# Patient Record
Sex: Male | Born: 1960 | Race: Black or African American | Hispanic: No | Marital: Single | Smoking: Never smoker
Health system: Southern US, Community
[De-identification: ages and names within clinical notes are randomized; demographics above are authoritative.]

## PROBLEM LIST (undated history)

## (undated) DIAGNOSIS — Z21 Asymptomatic human immunodeficiency virus [HIV] infection status: Secondary | ICD-10-CM

## (undated) DIAGNOSIS — B20 Human immunodeficiency virus [HIV] disease: Secondary | ICD-10-CM

## (undated) DIAGNOSIS — R7303 Prediabetes: Secondary | ICD-10-CM

## (undated) DIAGNOSIS — M1990 Unspecified osteoarthritis, unspecified site: Secondary | ICD-10-CM

## (undated) DIAGNOSIS — N400 Enlarged prostate without lower urinary tract symptoms: Secondary | ICD-10-CM

## (undated) DIAGNOSIS — I10 Essential (primary) hypertension: Secondary | ICD-10-CM

## (undated) DIAGNOSIS — K219 Gastro-esophageal reflux disease without esophagitis: Secondary | ICD-10-CM

## (undated) HISTORY — DX: Essential (primary) hypertension: I10

## (undated) HISTORY — DX: Asymptomatic human immunodeficiency virus (hiv) infection status: Z21

## (undated) HISTORY — DX: Human immunodeficiency virus (HIV) disease: B20

## (undated) HISTORY — DX: Benign prostatic hyperplasia without lower urinary tract symptoms: N40.0

## (undated) HISTORY — PX: OTHER SURGICAL HISTORY: SHX169

---
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
## 2003-02-05 HISTORY — PX: APPENDECTOMY: SHX54

## 2004-08-18 ENCOUNTER — Emergency Department (HOSPITAL_COMMUNITY): Admission: EM | Admit: 2004-08-18 | Discharge: 2004-08-18 | Payer: Self-pay | Admitting: Emergency Medicine

## 2005-01-21 ENCOUNTER — Emergency Department (HOSPITAL_COMMUNITY): Admission: EM | Admit: 2005-01-21 | Discharge: 2005-01-21 | Payer: Self-pay | Admitting: Emergency Medicine

## 2006-12-27 ENCOUNTER — Emergency Department (HOSPITAL_COMMUNITY): Admission: EM | Admit: 2006-12-27 | Discharge: 2006-12-28 | Payer: Self-pay | Admitting: Emergency Medicine

## 2007-01-07 ENCOUNTER — Emergency Department (HOSPITAL_COMMUNITY): Admission: EM | Admit: 2007-01-07 | Discharge: 2007-01-07 | Payer: Self-pay | Admitting: Family Medicine

## 2007-03-31 ENCOUNTER — Encounter: Payer: Self-pay | Admitting: Internal Medicine

## 2007-03-31 ENCOUNTER — Ambulatory Visit: Payer: Self-pay | Admitting: Internal Medicine

## 2007-03-31 LAB — CONVERTED CEMR LAB
ALT: 17 units/L (ref 0–53)
AST: 24 units/L (ref 0–37)
Basophils Absolute: 0 10*3/uL (ref 0.0–0.1)
Basophils Relative: 1 % (ref 0–1)
CO2: 22 meq/L (ref 19–32)
Calcium: 9.4 mg/dL (ref 8.4–10.5)
Chloride: 104 meq/L (ref 96–112)
Cholesterol: 139 mg/dL (ref 0–200)
Creatinine, Ser: 1.26 mg/dL (ref 0.40–1.50)
HCV Ab: POSITIVE — AB
Hemoglobin: 16.6 g/dL (ref 13.0–17.0)
Hep A Total Ab: POSITIVE — AB
Hep B Core Total Ab: POSITIVE — AB
Hep B E Ab: POSITIVE — AB
Hep B S Ab: POSITIVE — AB
Lymphocytes Relative: 33 % (ref 12–46)
MCHC: 33.5 g/dL (ref 30.0–36.0)
Neutro Abs: 2.1 10*3/uL (ref 1.7–7.7)
Neutrophils Relative %: 53 % (ref 43–77)
PSA: 0.79 ng/mL (ref 0.10–4.00)
Platelets: 211 10*3/uL (ref 150–400)
Potassium: 4.4 meq/L (ref 3.5–5.3)
RDW: 13.5 % (ref 11.5–15.5)
Sodium: 138 meq/L (ref 135–145)
Total CHOL/HDL Ratio: 3.2
Total Protein: 9.5 g/dL — ABNORMAL HIGH (ref 6.0–8.3)

## 2007-04-01 ENCOUNTER — Ambulatory Visit: Payer: Self-pay | Admitting: *Deleted

## 2007-04-07 ENCOUNTER — Encounter (INDEPENDENT_AMBULATORY_CARE_PROVIDER_SITE_OTHER): Payer: Self-pay | Admitting: Internal Medicine

## 2007-04-07 ENCOUNTER — Ambulatory Visit: Payer: Self-pay | Admitting: Internal Medicine

## 2007-04-07 LAB — CONVERTED CEMR LAB
ALT: 17 units/L (ref 0–53)
AST: 20 units/L (ref 0–37)
Absolute CD4: 134 #/uL — ABNORMAL LOW (ref 381–1469)
Albumin: 4.2 g/dL (ref 3.5–5.2)
Basophils Absolute: 0 10*3/uL (ref 0.0–0.1)
Basophils Relative: 1 % (ref 0–1)
CD4 T Helper %: 8 % — ABNORMAL LOW (ref 32–62)
CO2: 25 meq/L (ref 19–32)
Calcium: 9.6 mg/dL (ref 8.4–10.5)
Chlamydia, Swab/Urine, PCR: NEGATIVE
Chloride: 104 meq/L (ref 96–112)
GC Probe Amp, Urine: NEGATIVE
Hemoglobin: 16.1 g/dL (ref 13.0–17.0)
Lymphocytes Relative: 38 % (ref 12–46)
MCHC: 34.6 g/dL (ref 30.0–36.0)
Monocytes Relative: 13 % — ABNORMAL HIGH (ref 3–12)
Neutro Abs: 2 10*3/uL (ref 1.7–7.7)
Neutrophils Relative %: 45 % (ref 43–77)
Potassium: 4.2 meq/L (ref 3.5–5.3)
RBC: 5.29 M/uL (ref 4.22–5.81)
RDW: 12.8 % (ref 11.5–15.5)

## 2007-04-23 ENCOUNTER — Ambulatory Visit: Payer: Self-pay | Admitting: Internal Medicine

## 2007-04-29 ENCOUNTER — Ambulatory Visit: Payer: Self-pay | Admitting: Internal Medicine

## 2007-05-18 ENCOUNTER — Ambulatory Visit: Payer: Self-pay | Admitting: Internal Medicine

## 2007-06-15 ENCOUNTER — Ambulatory Visit: Payer: Self-pay | Admitting: Internal Medicine

## 2007-06-15 LAB — CONVERTED CEMR LAB
ALT: 22 units/L (ref 0–53)
Absolute CD4: 148 #/uL — ABNORMAL LOW (ref 381–1469)
Basophils Absolute: 0 10*3/uL (ref 0.0–0.1)
CO2: 21 meq/L (ref 19–32)
Calcium: 8.7 mg/dL (ref 8.4–10.5)
Chloride: 106 meq/L (ref 96–112)
Creatinine, Ser: 1.31 mg/dL (ref 0.40–1.50)
Glucose, Bld: 89 mg/dL (ref 70–99)
HCT: 45.1 % (ref 39.0–52.0)
HIV 1 RNA Quant: 862 copies/mL — ABNORMAL HIGH (ref <50)
Hemoglobin: 16 g/dL (ref 13.0–17.0)
Lymphocytes Relative: 28 % (ref 12–46)
Lymphs Abs: 1.5 10*3/uL (ref 0.7–4.0)
Monocytes Absolute: 0.5 10*3/uL (ref 0.1–1.0)
Monocytes Relative: 10 % (ref 3–12)
Neutro Abs: 3 10*3/uL (ref 1.7–7.7)
RBC: 5.01 M/uL (ref 4.22–5.81)
RDW: 13.3 % (ref 11.5–15.5)
WBC, lymph enumeration: 5.3 10*3/uL (ref 4.0–10.5)
WBC: 5.3 10*3/uL (ref 4.0–10.5)

## 2007-06-25 ENCOUNTER — Ambulatory Visit: Payer: Self-pay | Admitting: Internal Medicine

## 2007-07-14 ENCOUNTER — Emergency Department (HOSPITAL_COMMUNITY): Admission: EM | Admit: 2007-07-14 | Discharge: 2007-07-15 | Payer: Self-pay | Admitting: Emergency Medicine

## 2007-09-21 ENCOUNTER — Ambulatory Visit: Payer: Self-pay | Admitting: Internal Medicine

## 2007-09-21 LAB — CONVERTED CEMR LAB
CO2: 24 meq/L (ref 19–32)
Creatinine, Ser: 1.4 mg/dL (ref 0.40–1.50)
Eosinophils Relative: 1 % (ref 0–5)
Glucose, Bld: 122 mg/dL — ABNORMAL HIGH (ref 70–99)
HCT: 48.3 % (ref 39.0–52.0)
HIV 1 RNA Quant: <50 copies/mL (ref <50)
Hemoglobin: 16.7 g/dL (ref 13.0–17.0)
Lymphocytes Relative: 20 % (ref 12–46)
Lymphs Abs: 1.3 10*3/uL (ref 0.7–4.0)
Monocytes Absolute: 0.4 10*3/uL (ref 0.1–1.0)
RDW: 13.6 % (ref 11.5–15.5)
Total Bilirubin: 0.4 mg/dL (ref 0.3–1.2)
Total Lymphocytes %: 20 % (ref 12–46)
WBC, lymph enumeration: 6.3 10*3/uL (ref 4.0–10.5)
WBC: 6.3 10*3/uL (ref 4.0–10.5)

## 2007-10-08 ENCOUNTER — Ambulatory Visit: Payer: Self-pay | Admitting: Internal Medicine

## 2007-12-07 ENCOUNTER — Ambulatory Visit: Payer: Self-pay | Admitting: Internal Medicine

## 2007-12-28 DIAGNOSIS — B1710 Acute hepatitis C without hepatic coma: Secondary | ICD-10-CM | POA: Insufficient documentation

## 2007-12-28 DIAGNOSIS — M1990 Unspecified osteoarthritis, unspecified site: Secondary | ICD-10-CM | POA: Insufficient documentation

## 2007-12-28 DIAGNOSIS — G4700 Insomnia, unspecified: Secondary | ICD-10-CM | POA: Insufficient documentation

## 2007-12-28 DIAGNOSIS — B20 Human immunodeficiency virus [HIV] disease: Secondary | ICD-10-CM | POA: Insufficient documentation

## 2008-01-07 ENCOUNTER — Ambulatory Visit: Payer: Self-pay | Admitting: Internal Medicine

## 2008-01-07 LAB — CONVERTED CEMR LAB
Alkaline Phosphatase: 124 units/L — ABNORMAL HIGH (ref 39–117)
Creatinine, Ser: 1.39 mg/dL (ref 0.40–1.50)
Eosinophils Absolute: 0.1 10*3/uL (ref 0.0–0.7)
Eosinophils Relative: 1 % (ref 0–5)
Glucose, Bld: 112 mg/dL — ABNORMAL HIGH (ref 70–99)
HCT: 48.2 % (ref 39.0–52.0)
HIV 1 RNA Quant: 96 copies/mL — ABNORMAL HIGH (ref <48)
HIV-1 RNA Quant, Log: 1.98 — ABNORMAL HIGH (ref <1.68)
Lymphs Abs: 1.3 10*3/uL (ref 0.7–4.0)
MCV: 95.6 fL (ref 78.0–100.0)
Platelets: 341 10*3/uL (ref 150–400)
RDW: 13 % (ref 11.5–15.5)
Sodium: 140 meq/L (ref 135–145)
Total Bilirubin: 0.4 mg/dL (ref 0.3–1.2)
Total Lymphocytes %: 24 % (ref 12–46)
Total Protein: 8.6 g/dL — ABNORMAL HIGH (ref 6.0–8.3)
Total lymphocyte count: 1272 cells/mcL (ref 700–3300)
WBC, lymph enumeration: 5.3 10*3/uL (ref 4.0–10.5)
WBC: 5.3 10*3/uL (ref 4.0–10.5)

## 2008-01-18 ENCOUNTER — Ambulatory Visit: Payer: Self-pay | Admitting: Internal Medicine

## 2008-02-04 ENCOUNTER — Ambulatory Visit: Payer: Self-pay | Admitting: Internal Medicine

## 2008-04-04 ENCOUNTER — Encounter: Payer: Self-pay | Admitting: Internal Medicine

## 2008-04-04 ENCOUNTER — Ambulatory Visit: Payer: Self-pay | Admitting: Internal Medicine

## 2008-04-04 LAB — CONVERTED CEMR LAB
AST: 28 units/L (ref 0–37)
Absolute CD4: 441 #/uL (ref 381–1469)
Alkaline Phosphatase: 125 units/L — ABNORMAL HIGH (ref 39–117)
BUN: 17 mg/dL (ref 6–23)
Band Neutrophils: 0 % (ref 0–10)
Basophils Absolute: 0 10*3/uL (ref 0.0–0.1)
Basophils Relative: 1 % (ref 0–1)
CD4 T Helper %: 26 % — ABNORMAL LOW (ref 32–62)
Creatinine, Ser: 1.24 mg/dL (ref 0.40–1.50)
Eosinophils Absolute: 0.1 10*3/uL (ref 0.0–0.7)
Eosinophils Relative: 3 % (ref 0–5)
Glucose, Bld: 79 mg/dL (ref 70–99)
HIV-1 RNA Quant, Log: <1.68 (ref <1.68)
MCHC: 33.2 g/dL (ref 30.0–36.0)
MCV: 98.2 fL (ref 78.0–100.0)
Monocytes Absolute: 0.6 10*3/uL (ref 0.1–1.0)
Platelets: 302 10*3/uL (ref 150–400)
RDW: 13 % (ref 11.5–15.5)
Total Lymphocytes %: 32 % (ref 12–46)
WBC, lymph enumeration: 5.3 10*3/uL (ref 4.0–10.5)
WBC: 5.3 10*3/uL (ref 4.0–10.5)

## 2008-04-06 ENCOUNTER — Ambulatory Visit: Payer: Self-pay | Admitting: Internal Medicine

## 2008-04-18 ENCOUNTER — Encounter: Payer: Self-pay | Admitting: Internal Medicine

## 2008-04-18 ENCOUNTER — Ambulatory Visit: Payer: Self-pay | Admitting: Internal Medicine

## 2008-04-25 ENCOUNTER — Telehealth: Payer: Self-pay | Admitting: Internal Medicine

## 2008-06-10 ENCOUNTER — Telehealth: Payer: Self-pay | Admitting: Internal Medicine

## 2008-08-04 ENCOUNTER — Ambulatory Visit: Payer: Self-pay | Admitting: Internal Medicine

## 2008-08-04 ENCOUNTER — Encounter: Payer: Self-pay | Admitting: Internal Medicine

## 2008-08-04 DIAGNOSIS — M79609 Pain in unspecified limb: Secondary | ICD-10-CM | POA: Insufficient documentation

## 2008-08-04 LAB — CONVERTED CEMR LAB
AST: 25 units/L (ref 0–37)
Absolute CD4: 296 #/uL — ABNORMAL LOW (ref 381–1469)
Albumin: 4.5 g/dL (ref 3.5–5.2)
Alkaline Phosphatase: 105 units/L (ref 39–117)
BUN: 17 mg/dL (ref 6–23)
Basophils Absolute: 0 10*3/uL (ref 0.0–0.1)
Basophils Relative: 1 % (ref 0–1)
CD4 T Helper %: 26 % — ABNORMAL LOW (ref 32–62)
Creatinine, Ser: 1.34 mg/dL (ref 0.40–1.50)
Eosinophils Relative: 2 % (ref 0–5)
Glucose, Bld: 115 mg/dL — ABNORMAL HIGH (ref 70–99)
HCT: 44.5 % (ref 39.0–52.0)
HIV 1 RNA Quant: <48 copies/mL (ref <48)
HIV-1 RNA Quant, Log: <1.68 (ref <1.68)
Hemoglobin: 15.6 g/dL (ref 13.0–17.0)
Lymphocytes Relative: 20 % (ref 12–46)
MCHC: 35.1 g/dL (ref 30.0–36.0)
Monocytes Absolute: 0.6 10*3/uL (ref 0.1–1.0)
Platelets: 265 10*3/uL (ref 150–400)
Potassium: 4 meq/L (ref 3.5–5.3)
RDW: 13.7 % (ref 11.5–15.5)
Total Bilirubin: 0.5 mg/dL (ref 0.3–1.2)
Total lymphocyte count: 1140 cells/mcL (ref 700–3300)

## 2008-08-18 ENCOUNTER — Encounter: Payer: Self-pay | Admitting: Internal Medicine

## 2008-08-18 ENCOUNTER — Ambulatory Visit: Payer: Self-pay | Admitting: Internal Medicine

## 2008-08-22 ENCOUNTER — Encounter: Payer: Self-pay | Admitting: Internal Medicine

## 2008-08-22 ENCOUNTER — Ambulatory Visit: Payer: Self-pay | Admitting: Internal Medicine

## 2008-08-22 DIAGNOSIS — R030 Elevated blood-pressure reading, without diagnosis of hypertension: Secondary | ICD-10-CM | POA: Insufficient documentation

## 2008-08-22 DIAGNOSIS — K029 Dental caries, unspecified: Secondary | ICD-10-CM | POA: Insufficient documentation

## 2008-09-14 ENCOUNTER — Ambulatory Visit: Payer: Self-pay | Admitting: Internal Medicine

## 2008-10-03 ENCOUNTER — Ambulatory Visit: Payer: Self-pay | Admitting: Internal Medicine

## 2008-10-03 ENCOUNTER — Encounter: Payer: Self-pay | Admitting: Internal Medicine

## 2008-10-26 ENCOUNTER — Encounter: Payer: Self-pay | Admitting: Internal Medicine

## 2008-11-28 ENCOUNTER — Ambulatory Visit: Payer: Self-pay | Admitting: Family Medicine

## 2008-11-28 ENCOUNTER — Encounter: Payer: Self-pay | Admitting: Internal Medicine

## 2008-11-28 DIAGNOSIS — M109 Gout, unspecified: Secondary | ICD-10-CM | POA: Insufficient documentation

## 2008-11-28 LAB — CONVERTED CEMR LAB
ALT: 12 units/L (ref 0–53)
AST: 15 units/L (ref 0–37)
Albumin: 4.4 g/dL (ref 3.5–5.2)
Alkaline Phosphatase: 98 units/L (ref 39–117)
BUN: 13 mg/dL (ref 6–23)
Basophils Absolute: 0 10*3/uL (ref 0.0–0.1)
Chloride: 105 meq/L (ref 96–112)
Eosinophils Relative: 3 % (ref 0–5)
HCT: 48.5 % (ref 39.0–52.0)
HDL: 77 mg/dL (ref >39)
HIV 1 RNA Quant: <48 copies/mL (ref <48)
LDL Cholesterol: 76 mg/dL (ref 0–99)
Lymphocytes Relative: 24 % (ref 12–46)
Lymphs Abs: 1.1 10*3/uL (ref 0.7–4.0)
Neutro Abs: 2.9 10*3/uL (ref 1.7–7.7)
Neutrophils Relative %: 62 % (ref 43–77)
Platelets: 260 10*3/uL (ref 150–400)
Potassium: 4.6 meq/L (ref 3.5–5.3)
RDW: 12.6 % (ref 11.5–15.5)
Total CHOL/HDL Ratio: 2.3
Total lymphocyte count: 1128 cells/mcL (ref 700–3300)
WBC: 4.7 10*3/uL (ref 4.0–10.5)

## 2008-12-15 ENCOUNTER — Ambulatory Visit: Payer: Self-pay | Admitting: Internal Medicine

## 2008-12-15 ENCOUNTER — Encounter: Payer: Self-pay | Admitting: Internal Medicine

## 2008-12-20 ENCOUNTER — Telehealth: Payer: Self-pay | Admitting: Internal Medicine

## 2009-05-04 ENCOUNTER — Ambulatory Visit: Payer: Self-pay | Admitting: Internal Medicine

## 2009-05-04 ENCOUNTER — Encounter: Payer: Self-pay | Admitting: Internal Medicine

## 2009-05-04 LAB — CONVERTED CEMR LAB
Absolute CD4: 306 #/uL — ABNORMAL LOW (ref 381–1469)
BUN: 14 mg/dL (ref 6–23)
Basophils Relative: 1 % (ref 0–1)
CD4 T Helper %: 31 % — ABNORMAL LOW (ref 32–62)
CO2: 22 meq/L (ref 19–32)
Creatinine, Ser: 1.27 mg/dL (ref 0.40–1.50)
Eosinophils Absolute: 0.2 10*3/uL (ref 0.0–0.7)
Eosinophils Relative: 3 % (ref 0–5)
Glucose, Bld: 101 mg/dL — ABNORMAL HIGH (ref 70–99)
HIV 1 RNA Quant: <48 copies/mL (ref <48)
HIV-1 RNA Quant, Log: <1.68 (ref <1.68)
Hemoglobin: 15.3 g/dL (ref 13.0–17.0)
MCHC: 33.2 g/dL (ref 30.0–36.0)
MCV: 97.3 fL (ref 78.0–100.0)
Monocytes Absolute: 0.4 10*3/uL (ref 0.1–1.0)
Monocytes Relative: 9 % (ref 3–12)
RBC: 4.74 M/uL (ref 4.22–5.81)
RDW: 13.5 % (ref 11.5–15.5)
Total Bilirubin: 0.3 mg/dL (ref 0.3–1.2)
Total Protein: 7.9 g/dL (ref 6.0–8.3)
Total lymphocyte count: 987 cells/mcL (ref 700–3300)

## 2009-05-18 ENCOUNTER — Telehealth (INDEPENDENT_AMBULATORY_CARE_PROVIDER_SITE_OTHER): Payer: Self-pay | Admitting: *Deleted

## 2009-06-12 ENCOUNTER — Telehealth: Payer: Self-pay | Admitting: Internal Medicine

## 2009-06-23 ENCOUNTER — Ambulatory Visit: Payer: Self-pay | Admitting: Internal Medicine

## 2009-07-13 ENCOUNTER — Telehealth: Payer: Self-pay | Admitting: Internal Medicine

## 2009-08-10 ENCOUNTER — Ambulatory Visit: Payer: Self-pay | Admitting: Internal Medicine

## 2009-08-10 LAB — CONVERTED CEMR LAB
AST: 34 units/L (ref 0–37)
Albumin: 4.1 g/dL (ref 3.5–5.2)
Alkaline Phosphatase: 111 units/L (ref 39–117)
BUN: 18 mg/dL (ref 6–23)
Basophils Absolute: 0 10*3/uL (ref 0.0–0.1)
Creatinine, Ser: 1.12 mg/dL (ref 0.40–1.50)
Eosinophils Relative: 3 % (ref 0–5)
HCT: 47.2 % (ref 39.0–52.0)
HIV-1 RNA Quant, Log: <1.68 (ref <1.68)
Lymphocytes Relative: 35 % (ref 12–46)
Platelets: 292 10*3/uL (ref 150–400)
Potassium: 4.2 meq/L (ref 3.5–5.3)
RDW: 13.2 % (ref 11.5–15.5)

## 2009-08-15 ENCOUNTER — Telehealth: Payer: Self-pay | Admitting: Internal Medicine

## 2009-09-01 ENCOUNTER — Ambulatory Visit: Payer: Self-pay | Admitting: Internal Medicine

## 2009-09-22 ENCOUNTER — Encounter (INDEPENDENT_AMBULATORY_CARE_PROVIDER_SITE_OTHER): Payer: Self-pay | Admitting: *Deleted

## 2009-12-13 ENCOUNTER — Encounter (INDEPENDENT_AMBULATORY_CARE_PROVIDER_SITE_OTHER): Payer: Self-pay | Admitting: *Deleted

## 2009-12-13 ENCOUNTER — Telehealth (INDEPENDENT_AMBULATORY_CARE_PROVIDER_SITE_OTHER): Payer: Self-pay | Admitting: *Deleted

## 2010-01-02 ENCOUNTER — Encounter (INDEPENDENT_AMBULATORY_CARE_PROVIDER_SITE_OTHER): Payer: Self-pay | Admitting: *Deleted

## 2010-01-22 ENCOUNTER — Encounter: Payer: Self-pay | Admitting: Internal Medicine

## 2010-01-22 ENCOUNTER — Ambulatory Visit: Payer: Self-pay | Admitting: Internal Medicine

## 2010-01-22 LAB — CONVERTED CEMR LAB
ALT: 22 units/L (ref 0–53)
CO2: 26 meq/L (ref 19–32)
Calcium: 9.7 mg/dL (ref 8.4–10.5)
Chloride: 102 meq/L (ref 96–112)
Eosinophils Absolute: 0.2 10*3/uL (ref 0.0–0.7)
HIV 1 RNA Quant: 417000 copies/mL — ABNORMAL HIGH (ref <20)
HIV-1 RNA Quant, Log: 5.62 — ABNORMAL HIGH (ref <1.30)
Lymphocytes Relative: 43 % (ref 12–46)
Lymphs Abs: 2.4 10*3/uL (ref 0.7–4.0)
Neutrophils Relative %: 40 % — ABNORMAL LOW (ref 43–77)
Platelets: 271 10*3/uL (ref 150–400)
Potassium: 4.6 meq/L (ref 3.5–5.3)
Sodium: 139 meq/L (ref 135–145)
Total Protein: 7.8 g/dL (ref 6.0–8.3)
WBC: 5.5 10*3/uL (ref 4.0–10.5)

## 2010-01-31 ENCOUNTER — Telehealth (INDEPENDENT_AMBULATORY_CARE_PROVIDER_SITE_OTHER): Payer: Self-pay | Admitting: *Deleted

## 2010-01-31 ENCOUNTER — Encounter: Payer: Self-pay | Admitting: Internal Medicine

## 2010-02-09 ENCOUNTER — Encounter: Payer: Self-pay | Admitting: Internal Medicine

## 2010-02-09 ENCOUNTER — Ambulatory Visit
Admission: RE | Admit: 2010-02-09 | Discharge: 2010-02-09 | Payer: Self-pay | Source: Home / Self Care | Attending: Internal Medicine | Admitting: Internal Medicine

## 2010-02-12 ENCOUNTER — Encounter: Payer: Self-pay | Admitting: Internal Medicine

## 2010-02-12 ENCOUNTER — Telehealth (INDEPENDENT_AMBULATORY_CARE_PROVIDER_SITE_OTHER): Payer: Self-pay | Admitting: *Deleted

## 2010-03-06 NOTE — Progress Notes (Signed)
Summary: [facility]/pt assist med arrived for Apr  Prescriptions: ATRIPLA 600-200-300 MG TABS (EFAVIRENZ-EMTRICITAB-TENOFOVIR) Take 1 tablet by mouth at bedtime  #30 x 0   Entered by:   Paulo Fruit  BS,CPht II,MPH   Authorized by:   Yisroel Ramming MD   Signed by:   Paulo Fruit  BS,CPht II,MPH on 05/18/2009   Method used:   Samples Given   RxID:   [id number]   Patient Assist Medication Verification: Medication: Atripla Lot# [id number] Exp Date:07 2013 Tech approval:MLD Call placed to patient with message that assistance medications are ready for pick-up. Left message on VM for patient to contact [facility] @ [phone] Paulo Fruit  BS,CPht II,MPH  May 18, 2009 12:12 PM

## 2010-03-06 NOTE — Progress Notes (Signed)
Summary: [facility]/pt assist med arrived for May  Phone Note Refill Request      Prescriptions: ATRIPLA 600-200-300 MG TABS (EFAVIRENZ-EMTRICITAB-TENOFOVIR) Take 1 tablet by mouth at bedtime  #30 x 0   Entered by:   Paulo Fruit  BS,CPht II,MPH   Authorized by:   Yisroel Ramming MD   Signed by:   Paulo Fruit  BS,CPht II,MPH on 06/12/2009   Method used:   Samples Given   RxID:   [id number]   Patient Assist Medication Verification: Medication: Atripla  NFA#[id number] Exp Date:10 2013 Tech approval:MLD               Tried to contact patient. Was unable to leave a message because the mail box was full at the time the call was placed. Paulo Fruit  BS,CPht II,MPH  Jun 12, 2009 3:48 PM

## 2010-03-06 NOTE — Miscellaneous (Signed)
Summary: HIPAA Restrictions  HIPAA Restrictions   Imported By: Florinda Marker 06/27/2009 12:00:21  _____________________________________________________________________  External Attachment:    Type:   Image     Comment:   External Document

## 2010-03-06 NOTE — Miscellaneous (Signed)
Summary: RW Flowsheet update  Clinical Lists Changes  Observations: Added new observation of MARITAL STAT: Single (09/22/2009 11:08) Added new observation of PAT[place]COUNTY: [place] (09/22/2009 11:08) Added new observation of RW VITAL STA: Active (09/22/2009 11:08) Added new observation of INFECTDIS MD: Philipp Deputy (09/22/2009 11:08) Added new observation of GENDER: Male (09/22/2009 11:08) Added new observation of LATINO/HISP: No (09/22/2009 11:08) Added new observation of RACE: African American (09/22/2009 11:08) Added new observation of PAYOR: No Insurance (09/22/2009 11:08)

## 2010-03-06 NOTE — Miscellaneous (Signed)
  Clinical Lists Changes  Observations: Added new observation of YEARAIDSPOS: 2009  (01/02/2010 9:51) Added new observation of HIV STATUS: CDC-defined AIDS  (01/02/2010 9:51)

## 2010-03-06 NOTE — Progress Notes (Signed)
Summary: [facility]/pt assist med arrived for Jun via [facility] ADAP  Phone Note Refill Request      Prescriptions: ATRIPLA 600-200-300 MG TABS (EFAVIRENZ-EMTRICITAB-TENOFOVIR) Take 1 tablet by mouth at bedtime  #30 x 0   Entered by:   Paulo Fruit  BS,CPht II,MPH   Authorized by:   Yisroel Ramming MD   Signed by:   Paulo Fruit  BS,CPht II,MPH on 07/13/2009   Method used:   Samples Given   RxID:   [id number]  Patient Assist Medication Verification: Medication name: Atripla RX # [id number] Tech approval:MLD  Tried to contact patient; number on file is temporarily disconnected. Paulo Fruit  BS,CPht II,MPH  July 13, 2009 10:20 AM

## 2010-03-06 NOTE — Assessment & Plan Note (Signed)
Summary: f/u [mkj]   CC:  follow-up visit, lab results, and needs refill on Vicodin.  History of Present Illness: Pt here for f/u.  He is living with his sister now because he has been unable to find work. No missed doses of his Atripla. Pt c/o intermittant back and knee pain and would like a refill on his vicodin which he takes as needed.  Preventive Screening-Counseling & Management  Alcohol-Tobacco     Alcohol drinks/day: 0     Smoking Status: never  Caffeine-Diet-Exercise     Caffeine use/day: 1     Does Patient Exercise: yes     Type of exercise: walking,weights     Exercise (avg: min/session): 30-60     Times/week: 3  Safety-Violence-Falls     Seat Belt Use: yes      Sexual History:  n/a.    Comments: pt. given condoms   Updated Prior Medication List: ATRIPLA 600-200-300 MG TABS (EFAVIRENZ-EMTRICITAB-TENOFOVIR) Take 1 tablet by mouth at bedtime COLCHICINE 0.6 MG TABS (COLCHICINE) Take 1 tablet by mouth two times a day VICODIN 5-500 MG TABS (HYDROCODONE-ACETAMINOPHEN) Take 1 tablet by mouth every 6 hours as needed TRIAMCINOLONE ACETONIDE 0.1 % CREA (TRIAMCINOLONE ACETONIDE) apply two times a day  Current Allergies (reviewed today): No known allergies  Past History:  Past Medical History: Last updated: 12/28/2007 Hepatitis C HIV disease Osteoarthritis  Review of Systems       The patient complains of weight loss.  The patient denies anorexia, fever, and headaches.    Vital Signs:  Patient profile:   50 year old male Height:      77 inches (195.58 cm) Weight:      205.8 pounds (93.55 kg) BMI:     24.49 Temp:     97.7 degrees F (36.50 degrees C) oral Pulse rate:   71 / minute BP sitting:   132 / 73  (right arm)  Vitals Entered By: Wendall Mola CMA Duncan Dull) (September 01, 2009 9:30 AM) CC: follow-up visit, lab results, needs refill on Vicodin Is Patient Diabetic? No Pain Assessment Patient in pain? yes     Location: knees, lower back Intensity:  8 Type: aching Onset of pain  Constant Nutritional Status BMI of 19 -24 = normal Nutritional Status Detail appetite "better"  Does patient need assistance? Functional Status Self care Ambulation Normal Comments pt. missed three doses of Atripla because he ran out, it should be here   Physical Exam  General:  alert, well-developed, well-nourished, and well-hydrated.   Head:  normocephalic and atraumatic.   Mouth:  pharynx pink and moist.   Lungs:  normal breath sounds.          Medication Adherence: 09/01/2009   Adherence to medications reviewed with patient. Counseling to provide adequate adherence provided   Prevention For Positives: 09/01/2009   Safe sex practices discussed with patient. Condoms offered.                             Impression & Recommendations:  Problem # 1:  HIV DISEASE (ICD-042) Pt.s most recent CD4ct was 530 and VL <48 .  Pt instructed to continue the current antiretroviral regimen.  Pt encouraged to take medication regularly and not miss doses.  Pt will f/u in 3 months for repeat blood work and will see me 2 weeks later.  Diagnostics Reviewed:  CD4: 530 (08/11/2009)   WBC: 5.0 (08/10/2009)   PMN (bands): 0 (04/04/2008)  Hgb: 15.6 (08/10/2009)   HCT: 47.2 (08/10/2009)   Platelets: 292 (08/10/2009) HIV genotype: TNP (09/21/2007)   HIV-1 RNA: <48 copies/mL (08/10/2009)   HBSAg: NEG (08/10/2009)  Problem # 2:  OSTEOARTHRITIS (ICD-715.90) refill vicodin His updated medication list for this problem includes:    Vicodin 5-500 Mg Tabs (Hydrocodone-acetaminophen) .[name]... Take 1 tablet by mouth every 6 hours as needed  Other Orders: Est. Patient Level III ([address]) Future Orders: T-CD4SP ([facility]) (CD4SP) ... 11/30/2009 T-HIV Viral Load [phone]) ... 11/30/2009 T-Comprehensive Metabolic Panel [phone]) ... 11/30/2009 T-CBC w/Diff ([id number]) ... 11/30/2009  Patient Instructions: 1)  Please schedule a follow-up appointment in 3 months, 2  weeks after labs.  Prescriptions: VICODIN 5-500 MG TABS (HYDROCODONE-ACETAMINOPHEN) Take 1 tablet by mouth every 6 hours as needed  #30 x 0   Entered and Authorized by:   Yisroel Ramming MD   Signed by:   Yisroel Ramming MD on 09/01/2009   Method used:   Print then Give to Patient   RxID:   [id number]

## 2010-03-06 NOTE — Assessment & Plan Note (Signed)
Summary: f/u [mkj]   CC:  follow-up visit and lab results.  History of Present Illness: Pt here for f/u.  Needs refill on Atripla and triamcinolone cream. No missed doses of his meds.  Preventive Screening-Counseling & Management  Alcohol-Tobacco     Alcohol drinks/day: 0     Smoking Status: never  Caffeine-Diet-Exercise     Caffeine use/day: 1     Does Patient Exercise: yes     Type of exercise: walking,weights     Exercise (avg: min/session): 30-60     Times/week: 3  Safety-Violence-Falls     Seat Belt Use: yes      Sexual History:  n/a.    Comments: pt. given condoms   Updated Prior Medication List: ATRIPLA 600-200-300 MG TABS (EFAVIRENZ-EMTRICITAB-TENOFOVIR) Take 1 tablet by mouth at bedtime COLCHICINE 0.6 MG TABS (COLCHICINE) Take 1 tablet by mouth two times a day VICODIN 5-500 MG TABS (HYDROCODONE-ACETAMINOPHEN) Take 1 tablet by mouth every 6 hours as needed TRIAMCINOLONE ACETONIDE 0.1 % CREA (TRIAMCINOLONE ACETONIDE) apply two times a day  Current Allergies (reviewed today): No known allergies  Past History:  Past Medical History: Last updated: 12/28/2007 Hepatitis C HIV disease Osteoarthritis  Social History: Sexual History:  n/a  Review of Systems  The patient denies anorexia, fever, and weight loss.    Vital Signs:  Patient profile:   50 year old male Height:      77 inches (195.58 cm) Weight:      215.8 pounds (98.09 kg) BMI:     25.68 Temp:     98.0 degrees F (36.67 degrees C) oral Pulse rate:   87 / minute BP sitting:   133 / 74  (right arm)  Vitals Entered By: Wendall Mola CMA Duncan Dull) (Jun 23, 2009 2:56 PM) CC: follow-up visit and lab results Is Patient Diabetic? No Pain Assessment Patient in pain? no      Nutritional Status BMI of 25 - 29 = overweight Nutritional Status Detail appetite "so-so"  Does patient need assistance? Functional Status Self care Ambulation Normal Comments pt. missed about 3 weeks of meds since  last visit   Physical Exam  General:  alert, well-developed, well-nourished, and well-hydrated.   Head:  normocephalic and atraumatic.   Mouth:  pharynx pink and moist.   Lungs:  normal breath sounds.      Impression & Recommendations:  Problem # 1:  HIV DISEASE (ICD-042) Pt.s most recent CD4ct was 306 and VL <48 .  Pt instructed to continue the current antiretroviral regimen.  Pt encouraged to take medication regularly and not miss doses.  Pt will f/u in 6 weeks for repeat blood work and will see me 2 weeks later.  Diagnostics Reviewed:  WBC: 4.7 (05/04/2009)   PMN (bands): 0 (04/04/2008)   Hgb: 15.3 (05/04/2009)   HCT: 46.1 (05/04/2009)   Platelets: 288 (05/04/2009) HIV genotype: TNP (09/21/2007)   HIV-1 RNA: <48 copies/mL (05/04/2009)     Medications Added to Medication List This Visit: 1)  Triamcinolone Acetonide 0.1 % Crea (Triamcinolone acetonide) .... Apply two times a day  Other Orders: Est. Patient Level III ([address]) Future Orders: T-CD4SP ([facility]) (CD4SP) ... 08/04/2009 T-HIV Viral Load [phone]) ... 08/04/2009 T-Comprehensive Metabolic Panel [phone]) ... 08/04/2009 T-CBC w/Diff ([id number]) ... 08/04/2009 T-Hepatitis B Surface Antigen [phone]) ... 08/04/2009  Patient Instructions: 1)  Please schedule a follow-up appointment in 8 weeks, 2 weeks after labs.  Prescriptions: TRIAMCINOLONE ACETONIDE 0.1 % CREA (TRIAMCINOLONE ACETONIDE) apply two times a day  #30gm  x 5   Entered and Authorized by:   Yisroel Ramming MD   Signed by:   Yisroel Ramming MD on 06/23/2009   Method used:   Print then Give to Patient   RxID:   [id number]

## 2010-03-06 NOTE — Progress Notes (Signed)
Summary: Atripla refill  Phone Note Call from Patient   Caller: Patient Reason for Call: Refill Medication Summary of Call: Pt. called requesting refill on Atripla, pt. set up for labs and office visit,  RX sent to [facility] and will be shipped to this clinic Initial call taken by: Wendall Mola CMA Duncan Dull),  December 13, 2009 3:09 PM     Appended Document: Atripla refill Pt. notified ADAP med. arrived, will pick up Monday 12/18/09

## 2010-03-06 NOTE — Miscellaneous (Signed)
  Clinical Lists Changes  Orders: Added new Test order of T-CBC w/Diff [phone]) - Signed Added new Test order of T-CD4SP [facility]) (CD4SP) - Signed Added new Test order of T-Comprehensive Metabolic Panel [phone]) - Signed Added new Test order of T-HIV Viral Load [phone]) - Signed     Process Orders Check Orders Results:     [facility]: ABN not required for this insurance Tests Sent for requisitioning (December 13, 2009 2:49 PM):     12/20/2009: [facility] -- T-CBC w/Diff [[id number]] (signed)     12/20/2009: [facility] -- T-Comprehensive Metabolic Panel [[id number]] (signed)     12/20/2009: [facility] -- T-HIV Viral Load [phone] (signed)

## 2010-03-06 NOTE — Progress Notes (Signed)
Summary: [facility]AP/pt assist med arrived for Jul  Phone Note Refill Request      Prescriptions: ATRIPLA 600-200-300 MG TABS (EFAVIRENZ-EMTRICITAB-TENOFOVIR) Take 1 tablet by mouth at bedtime  #30 x 0   Entered by:   Paulo Fruit  BS,CPht II,MPH   Authorized by:   Yisroel Ramming MD   Signed by:   Paulo Fruit  BS,CPht II,MPH on 08/15/2009   Method used:   Samples Given   RxID:   [id number]  Patient Assist Medication Verification: Medication name: Atripla RX # [id number] Tech approval:MLD  Tried to contact patient.  The number on file is disconnected. Paulo Fruit  BS,CPht II,MPH  August 15, 2009 10:54 AM

## 2010-03-06 NOTE — Miscellaneous (Signed)
Summary: Office Visit ([facility])    Vital Signs:  Patient profile:   50 year old male Weight:      224.8 pounds Temp:     97.9 degrees F oral Pulse rate:   82 / minute Pulse rhythm:   regular Resp:     18 per minute BP sitting:   147 / 87  (left arm)  Vitals Entered By: Sharen Heck RN (May 04, 2009 9:28 AM) CC: f/u 05 Is Patient Diabetic? No Pain Assessment Patient in pain? no       Does patient need assistance? Functional Status Self care Ambulation Normal   CC:  f/u 05.  History of Present Illness: Pt feeling well. No missed doses of his Atripla.  Preventive Screening-Counseling & Management  Alcohol-Tobacco     Alcohol drinks/day: 0     Smoking Status: never  Caffeine-Diet-Exercise     Caffeine use/day: 1     Does Patient Exercise: yes     Type of exercise: walking,weights     Exercise (avg: min/session): 30-60     Times/week: 3  Current Problems (verified): 1)  Gout, Unspecified  (ICD-274.9) 2)  Dental Caries  (ICD-521.00) 3)  Elevated Blood Pressure  (ICD-796.2) 4)  Toe Pain  (ICD-729.5) 5)  Preventive Health Care  (ICD-V70.0) 6)  Insomnia  (ICD-780.52) 7)  Osteoarthritis  (ICD-715.90) 8)  HIV Disease  (ICD-042) 9)  Hepatitis C  (ICD-070.51)  Current Medications (verified): 1)  Atripla 600-200-300 Mg Tabs (Efavirenz-Emtricitab-Tenofovir) .... Take 1 Tablet By Mouth At Bedtime 2)  Colchicine 0.6 Mg Tabs (Colchicine) .... Take 1 Tablet By Mouth Two Times A Day 3)  Vicodin 5-500 Mg Tabs (Hydrocodone-Acetaminophen) .... Take 1 Tablet By Mouth Every 6 Hours As Needed  Allergies: No Known Drug Allergies   Review of Systems  The patient denies anorexia, fever, and weight loss.     Physical Exam  General:  alert, well-developed, well-nourished, and well-hydrated.   Head:  normocephalic and atraumatic.   Mouth:  pharynx pink and moist.   Lungs:  normal breath sounds.      Impression & Recommendations:  Problem # 1:  HIV DISEASE  (ICD-042) Will obtain labs today and have pt f/u in 2 weeks. Diagnostics Reviewed:  WBC: 4.7 (11/28/2008)   PMN (bands): 0 (04/04/2008)   Hgb: 16.4 (11/28/2008)   HCT: 48.5 (11/28/2008)   Platelets: 260 (11/28/2008) HIV genotype: TNP (09/21/2007)   HIV-1 RNA: <48 copies/mL (11/28/2008)     Other Orders: T-CD4SP ([facility]) (CD4SP) T-HIV Viral Load ([id number]) T-Comprehensive Metabolic Panel ([id number]) T-CBC w/Diff ([id number]) Est. Patient Level III (57[address])    Patient Instructions: 1)  Please schedule a follow-up appointment in 2 weeks.

## 2010-03-07 ENCOUNTER — Encounter (INDEPENDENT_AMBULATORY_CARE_PROVIDER_SITE_OTHER): Payer: Self-pay | Admitting: *Deleted

## 2010-03-08 NOTE — Progress Notes (Signed)
Summary: ADAP rxes arrived, phone disconnected  Phone Note Outgoing Call   Call placed by: Jennet Maduro RN,  January 31, 2010 4:16 PM Call placed to: Patient Action Taken: Assistance medications ready for pick up Summary of Call: [facility] rxes arrived. Current phone number disconnected. Jennet Maduro RN  January 31, 2010 4:17 PM

## 2010-03-08 NOTE — Assessment & Plan Note (Signed)
Summary: F/U OV/VS   CC:  follow-up visit, lab results, pt. was in the hosp. in [place] for pneumonia 11/11, and c/o rash on forearms.  History of Present Illness: patient has been out of town taking care of his uncle.  He has been off his medications for approximately 2 to 3 months.  He was also diagnosed with a pneumonia while he was out of town.  She has also lost weight.  When he returned to [place] he restarted his Atripla. He has been on his Atripla now for about two weeks.  He c/o bilateral knee pain and gout pain. Pt would like a Prostate screening test.  Preventive Screening-Counseling & Management  Alcohol-Tobacco     Alcohol drinks/day: 0     Smoking Status: never  Caffeine-Diet-Exercise     Caffeine use/day: 1     Does Patient Exercise: yes     Type of exercise: walking,weights     Exercise (avg: min/session): 30-60     Times/week: 3  Safety-Violence-Falls     Seat Belt Use: yes      Sexual History:  n/a.    Comments: pt. given condoms   Updated Prior Medication List: ATRIPLA 600-200-300 MG TABS (EFAVIRENZ-EMTRICITAB-TENOFOVIR) Take 1 tablet by mouth at bedtime VICODIN 5-500 MG TABS (HYDROCODONE-ACETAMINOPHEN) Take 1 tablet by mouth every 6 hours as needed TRIAMCINOLONE ACETONIDE 0.1 % CREA (TRIAMCINOLONE ACETONIDE) apply two times a day COLCRYS 0.6 MG TABS (COLCHICINE) take as directed  Current Allergies (reviewed today): No known allergies  Past History:  Past Medical History: Last updated: 12/28/2007 Hepatitis C HIV disease Osteoarthritis  Review of Systems  The patient denies anorexia, fever, and weight loss.    Vital Signs:  Patient profile:   50 year old male Height:      77 inches (195.58 cm) Weight:      208.8 pounds (94.91 kg) BMI:     24.85 Temp:     97.9 degrees F (36.61 degrees C) oral Pulse rate:   92 / minute BP sitting:   123 / 86  (right arm)  Vitals Entered By: Wendall Mola CMA Duncan Dull) (February 09, 2010 3:21  PM) CC: follow-up visit, lab results, pt. was in the hosp. in [place] for pneumonia 11/11, c/o rash on forearms Is Patient Diabetic? No Pain Assessment Patient in pain? yes     Location: knees and feet Intensity: 10 Type: burning and throbbing Onset of pain  Constant Nutritional Status BMI of 19 -24 = normal Nutritional Status Detail appetite "good"  Have you ever been in a relationship where you felt threatened, hurt or afraid?No   Does patient need assistance? Functional Status Self care Ambulation Normal Comments pt. was off Atripla for one month requests refills Vicodin and Colchicine   Physical Exam  General:  alert, well-developed, well-nourished, and well-hydrated.   Head:  normocephalic, atraumatic, no abnormalities observed, and no abnormalities palpated.   Eyes:  vision grossly intact and pupils equal.   Mouth:  pharynx pink and moist.  no thrush  Lungs:  normal breath sounds.     Impression & Recommendations:  Problem # 1:  HIV DISEASE (ICD-042) VL up and CD4ct down off his Atripla. Now that he restarted will check labs in 6 weeks.  He is encouraged to take his Atripla every day. Diagnostics Reviewed:  HIV: CDC-defined AIDS (01/02/2010)   CD4: 280 (01/23/2010)   WBC: 5.5 (01/22/2010)   PMN (bands): 0 (04/04/2008)   Hgb: 15.8 (01/22/2010)   HCT: 43.8 (  01/22/2010)   Platelets: 271 (01/22/2010) HIV genotype: TNP (09/21/2007)   HIV-1 RNA: 417000 (01/22/2010)   HBSAg: NEG (08/10/2009)  Problem # 2:  GOUT, UNSPECIFIED (ICD-274.9) will try to enroll him in a [facility] program for colcrys since they are not making colchicine anymore. The following medications were removed from the medication list:    Colchicine 0.6 Mg Tabs (Colchicine) .[name]... Take 1 tablet by mouth two times a day His updated medication list for this problem includes:    Colcrys 0.6 Mg Tabs (Colchicine) .[name]... Take as directed  Problem # 3:  PREVENTIVE HEALTH CARE (ICD-V70.0)  Orders: T-PSA  ([id number])  Medications Added to Medication List This Visit: 1)  Colcrys 0.6 Mg Tabs (Colchicine) .... Take as directed  Other Orders: Est. Patient Level III ([address]) TB Skin Test [phone]) Admin 1st Vaccine (24401) Future Orders: T-CD4SP ([facility]) (CD4SP) ... 03/23/2010 T-HIV Viral Load [phone]) ... 03/23/2010 T-Comprehensive Metabolic Panel [id number]) ... 03/23/2010 T-CBC w/Diff ([id number]) ... 03/23/2010 T-RPR (Syphilis) [phone]) ... 03/23/2010 T-Lipid Profile [phone]) ... 03/23/2010  Patient Instructions: 1)  Please schedule a follow-up 30 minute appointment in 8 weeks, 2 weeks after labs.  Prescriptions: COLCRYS 0.6 MG TABS (COLCHICINE) take as directed  #60 x 5   Entered and Authorized by:   Yisroel Ramming MD   Signed by:   Yisroel Ramming MD on 02/09/2010   Method used:   Print then Give to Patient   RxID:   [id number] VICODIN 5-500 MG TABS (HYDROCODONE-ACETAMINOPHEN) Take 1 tablet by mouth every 6 hours as needed  #30 x 0   Entered and Authorized by:   Yisroel Ramming MD   Signed by:   Yisroel Ramming MD on 02/09/2010   Method used:   Print then Give to Patient   RxID:   [id number]      Immunization History:  Influenza Immunization History:    Influenza:  historical (12/20/2009)  Immunizations Administered:  PPD Skin Test:    Vaccine Type: PPD    Site: right forearm    Mfr: Sanofi Pasteur    Dose: 0.1 ml    Route: ID    Given by: Wendall Mola CMA ( AAMA)    Exp. Date: 08/11/2011    Lot #: [id number]

## 2010-03-08 NOTE — Miscellaneous (Signed)
  Clinical Lists Changes  Observations: Added new observation of TB PPDRESULT: negative (02/12/2010 12:28) Added new observation of PPD RESULT: < 5mm (02/12/2010 12:28) Added new observation of TB-PPD RDDTE: 02/12/2010 (02/12/2010 12:28)      PPD Results    Date of reading: 02/12/2010    Results: < 5mm    Interpretation: negative

## 2010-03-08 NOTE — Letter (Signed)
Summary: [facility]  [address]   [place], [place] [address]   Phone: [phone]  Fax: [phone]         December 28,2011  [facility] [address] [place], [place]  [address]  Dear Mr. Tonkovich,  Your ADAP medications have arrived at the Center.  The telephone number that we have listed for you is out-of-order.  When you come to pick up your medication please update your phone number with out front desk personnel.  Thank you.  Sincerely,    Jennet Maduro [facility]

## 2010-03-08 NOTE — Progress Notes (Signed)
Summary: Dermatology referral  Phone Note Call from Patient   Caller: Patient Summary of Call: Pt. called and is concerned about worsening itchy rash on his elbows.  He said he showed it to you at office visit, but then got talking about something else.  The triamcinolone cream is not helping.  Is there something else you can prescribe? Initial call taken by: Wendall Mola CMA Duncan Dull),  February 12, 2010 12:34 PM  Follow-up for Phone Call        refer to Dermtology can take benadryl or claritin OTC Follow-up by: Yisroel Ramming MD,  February 12, 2010 2:11 PM  Additional Follow-up for Phone Call Additional follow up Details #1::        Pt. has no insurance and there are no dermatologist that participate with the discount card.  [facility] charges $125.00 for initial visit, and pt. can arrange pymt. plan after that.  Pt. aware and will call next week, if he wants referral scheduled. Additional Follow-up by: Wendall Mola CMA Duncan Dull),  February 13, 2010 12:33 PM

## 2010-03-14 ENCOUNTER — Encounter (INDEPENDENT_AMBULATORY_CARE_PROVIDER_SITE_OTHER): Payer: Self-pay | Admitting: *Deleted

## 2010-03-14 NOTE — Miscellaneous (Signed)
  Clinical Lists Changes  Observations: Added new observation of INCOMESOURCE: UNKNOWN (03/07/2010 12:06) Added new observation of HOUSEINCOME: 0  (03/07/2010 12:06) Added new observation of #CHILD<18 IN: Unknown  (03/07/2010 12:06) Added new observation of FAMILYSIZE: 2  (03/07/2010 12:06) Added new observation of HOUSING: Stable/permanent  (03/07/2010 12:06) Added new observation of YEARLYEXPEN: 0  (03/07/2010 12:06) Added new observation of HIV RISK BEH: Unknown  (03/07/2010 12:06)

## 2010-03-20 ENCOUNTER — Telehealth: Payer: Self-pay | Admitting: Adult Health

## 2010-03-22 NOTE — Miscellaneous (Signed)
  Clinical Lists Changes 

## 2010-03-26 ENCOUNTER — Other Ambulatory Visit (INDEPENDENT_AMBULATORY_CARE_PROVIDER_SITE_OTHER): Payer: Self-pay

## 2010-03-26 ENCOUNTER — Encounter: Payer: Self-pay | Admitting: Adult Health

## 2010-03-26 ENCOUNTER — Other Ambulatory Visit: Payer: Self-pay | Admitting: Adult Health

## 2010-03-26 DIAGNOSIS — B20 Human immunodeficiency virus [HIV] disease: Secondary | ICD-10-CM

## 2010-03-26 LAB — CONVERTED CEMR LAB
ALT: 26 units/L (ref 0–53)
BUN: 12 mg/dL (ref 6–23)
Basophils Absolute: 0 10*3/uL (ref 0.0–0.1)
Basophils Relative: 1 % (ref 0–1)
CO2: 27 meq/L (ref 19–32)
Creatinine, Ser: 1.27 mg/dL (ref 0.40–1.50)
HCT: 43.8 % (ref 39.0–52.0)
HDL: 73 mg/dL (ref >39)
HIV 1 RNA Quant: 649 {copies}/mL — ABNORMAL HIGH
HIV-1 RNA Quant, Log: 2.81 — ABNORMAL HIGH
Hemoglobin: 15.3 g/dL (ref 13.0–17.0)
Lymphocytes Relative: 34 % (ref 12–46)
MCV: 92.6 fL (ref 78.0–100.0)
Neutro Abs: 2.6 10*3/uL (ref 1.7–7.7)
Platelets: 285 10*3/uL (ref 150–400)
Total Bilirubin: 0.2 mg/dL — ABNORMAL LOW (ref 0.3–1.2)
Triglycerides: 57 mg/dL (ref <150)
WBC: 4.9 10*3/uL (ref 4.0–10.5)

## 2010-03-27 LAB — T-HELPER CELL (CD4) - (RCID CLINIC ONLY)
CD4 % Helper T Cell: 21 % — ABNORMAL LOW (ref 33–55)
CD4 T Cell Abs: 340 uL — ABNORMAL LOW (ref 400–2700)

## 2010-03-28 ENCOUNTER — Encounter (INDEPENDENT_AMBULATORY_CARE_PROVIDER_SITE_OTHER): Payer: Self-pay | Admitting: *Deleted

## 2010-03-28 NOTE — Progress Notes (Signed)
  Phone Note Call from Patient   Caller: Patient Call For: Abbie Sons Summary of Call: Patient is a client of [facility] and would like to have ensure bc of recent weight loss. Needs rx for ensure Initial call taken by: Starleen Arms CMA,  March 20, 2010 3:24 PM  Follow-up for Phone Call        RX FOR ENSURE PLUS WRITTEN  Additional Follow-up for Phone Call Additional follow up Details #1::        ok Additional Follow-up by: Starleen Arms CMA,  March 20, 2010 4:26 PM    New/Updated Medications: ENSURE PLUS  LIQD (NUTRITIONAL SUPPLEMENTS) 1 can by mouth three times a day between meals and at bedtime. Prescriptions: ENSURE PLUS  LIQD (NUTRITIONAL SUPPLEMENTS) 1 can by mouth three times a day between meals and at bedtime.  #4 cases x 3   Entered and Authorized by:   Talmadge Chad NP   Signed by:   Talmadge Chad NP on 03/20/2010   Method used:   Print then Give to Patient   RxID:   [id number]

## 2010-03-30 ENCOUNTER — Encounter (INDEPENDENT_AMBULATORY_CARE_PROVIDER_SITE_OTHER): Payer: Self-pay | Admitting: *Deleted

## 2010-04-03 NOTE — Miscellaneous (Signed)
  Clinical Lists Changes  Observations: Added new observation of HOUSING: stable/permanent (02/03/2010 11:42)

## 2010-04-03 NOTE — Miscellaneous (Signed)
  Clinical Lists Changes  Observations: Added new observation of HIV RISK BEH: MSM (03/28/2010 17:01)

## 2010-04-09 ENCOUNTER — Ambulatory Visit: Payer: Self-pay | Admitting: Internal Medicine

## 2010-04-13 ENCOUNTER — Ambulatory Visit: Payer: Self-pay | Admitting: Adult Health

## 2010-04-16 LAB — T-HELPER CELL (CD4) - (RCID CLINIC ONLY)
CD4 % Helper T Cell: 11 % — ABNORMAL LOW (ref 33–55)
CD4 T Cell Abs: 280 uL — ABNORMAL LOW (ref 400–2700)

## 2010-04-20 ENCOUNTER — Encounter: Payer: Self-pay | Admitting: Adult Health

## 2010-04-20 ENCOUNTER — Ambulatory Visit: Payer: Self-pay | Admitting: Adult Health

## 2010-04-20 ENCOUNTER — Ambulatory Visit (INDEPENDENT_AMBULATORY_CARE_PROVIDER_SITE_OTHER): Payer: Self-pay | Admitting: Adult Health

## 2010-04-20 DIAGNOSIS — B20 Human immunodeficiency virus [HIV] disease: Secondary | ICD-10-CM

## 2010-04-20 LAB — CONVERTED CEMR LAB: HIV 1 RNA Quant: 372 copies/mL — ABNORMAL HIGH (ref <20)

## 2010-04-22 LAB — T-HELPER CELL (CD4) - (RCID CLINIC ONLY)
CD4 % Helper T Cell: 33 % (ref 33–55)
CD4 T Cell Abs: 530 uL (ref 400–2700)

## 2010-04-26 ENCOUNTER — Telehealth: Payer: Self-pay | Admitting: Licensed Clinical Social Worker

## 2010-04-26 NOTE — Telephone Encounter (Signed)
Patient filled out patient assistance forms for a gout medication about 1 month ago and haven't heard back, would like to know if he is approved.

## 2010-04-27 ENCOUNTER — Telehealth: Payer: Self-pay | Admitting: Infectious Diseases

## 2010-04-27 NOTE — Telephone Encounter (Signed)
I received a message that Chad Little was calling to check on the status of his application for Colcrys.  The Prescription Assistance application was sent in January.  I called the [facility] Prescription Assistance Program and left a message inquiring about his status.Marland KitchenMarland KitchenAntionette Fairy

## 2010-04-30 ENCOUNTER — Telehealth: Payer: Self-pay | Admitting: Adult Health

## 2010-04-30 NOTE — Telephone Encounter (Signed)
Checking status of Prescription Assistance Application for Colcrys.  I had to leave another message.Antionette Fairy

## 2010-05-02 ENCOUNTER — Telehealth: Payer: Self-pay | Admitting: Adult Health

## 2010-05-02 NOTE — Telephone Encounter (Signed)
Patient was accepted and was notified by our patient assistance coordinator.

## 2010-05-02 NOTE — Telephone Encounter (Signed)
Mr. Ruhland called to check on where is Colcrys was.  [facility] Patient Assistance was called and said it should be here any day.  Mr. Brinkley needs to call when he is down to 1 month supply for a refill to be called in.  I called Mr. Winterbottom back and left a message regarding the above.Marland KitchenMarland KitchenAntionette Fairy

## 2010-05-09 ENCOUNTER — Ambulatory Visit: Payer: Self-pay

## 2010-05-09 ENCOUNTER — Telehealth: Payer: Self-pay | Admitting: Adult Health

## 2010-05-09 NOTE — Telephone Encounter (Signed)
[facility] PATIENT ASSISTANCE PROGRAM WAS CALLED TO FIND OUT THE STATUS OF THE MEDICATION DELIVERY FOR MR. [name].  IT HAS BEEN SHIPPED AND SHOULD BE HERE ANYDAY.  IT COMES BY REGULAR MAIL.  THEY GIVE THE ESTIMATION OF 7 - 10 DAYS BUSINESS DAYS, BUT COMING FROM [place] TO [place] SHOULD NOT TAKE THAT LONG.  PATIENT HAS COME IN THE OFFICE TO CHECK ON WHERE HIS MEDICATION IS.  I HAVE INFORMED HIM THAT HE WILL BE CALLED WHEN IT ARRIVES.Marland KitchenAntionette Fairy

## 2010-05-10 ENCOUNTER — Ambulatory Visit: Payer: Self-pay

## 2010-05-14 ENCOUNTER — Telehealth: Payer: Self-pay | Admitting: *Deleted

## 2010-05-14 MED ORDER — COLCHICINE 0.6 MG PO TABS: 0.6000 mg | ORAL_TABLET | Freq: Every day | ORAL | Status: DC

## 2010-05-14 NOTE — Telephone Encounter (Signed)
Sample received

## 2010-07-09 ENCOUNTER — Telehealth: Payer: Self-pay | Admitting: *Deleted

## 2010-07-09 ENCOUNTER — Other Ambulatory Visit: Payer: Self-pay

## 2010-07-09 ENCOUNTER — Other Ambulatory Visit: Payer: Self-pay | Admitting: Adult Health

## 2010-07-09 DIAGNOSIS — B20 Human immunodeficiency virus [HIV] disease: Secondary | ICD-10-CM

## 2010-07-09 NOTE — Telephone Encounter (Signed)
Patient notified that patient assistance medication has arrived Colcrys 0.6 mg. Wendall Mola CMA

## 2010-07-10 ENCOUNTER — Other Ambulatory Visit (INDEPENDENT_AMBULATORY_CARE_PROVIDER_SITE_OTHER): Payer: Self-pay

## 2010-07-10 ENCOUNTER — Other Ambulatory Visit: Payer: Self-pay | Admitting: Infectious Diseases

## 2010-07-10 DIAGNOSIS — B20 Human immunodeficiency virus [HIV] disease: Secondary | ICD-10-CM

## 2010-07-10 DIAGNOSIS — Z113 Encounter for screening for infections with a predominantly sexual mode of transmission: Secondary | ICD-10-CM

## 2010-07-10 LAB — CBC WITH DIFFERENTIAL/PLATELET
Basophils Absolute: 0 10*3/uL (ref 0.0–0.1)
HCT: 44 % (ref 39.0–52.0)
Hemoglobin: 15.2 g/dL (ref 13.0–17.0)
Lymphocytes Relative: 28 % (ref 12–46)
Monocytes Absolute: 0.5 10*3/uL (ref 0.1–1.0)
Neutro Abs: 4.1 10*3/uL (ref 1.7–7.7)
RBC: 4.56 MIL/uL (ref 4.22–5.81)
RDW: 13.1 % (ref 11.5–15.5)
WBC: 6.5 10*3/uL (ref 4.0–10.5)

## 2010-07-10 LAB — LIPID PANEL: LDL Cholesterol: 73 mg/dL (ref 0–99)

## 2010-07-11 LAB — URINALYSIS, ROUTINE W REFLEX MICROSCOPIC
Hgb urine dipstick: NEGATIVE
Leukocytes, UA: NEGATIVE
Nitrite: NEGATIVE
Protein, ur: NEGATIVE mg/dL
Urobilinogen, UA: 0.2 mg/dL (ref 0.0–1.0)
pH: 7 (ref 5.0–8.0)

## 2010-07-11 LAB — HIV-1 RNA QUANT-NO REFLEX-BLD: HIV 1 RNA Quant: 31 copies/mL — ABNORMAL HIGH (ref <20)

## 2010-07-11 LAB — COMPLETE METABOLIC PANEL WITH GFR
ALT: 16 U/L (ref 0–53)
AST: 23 U/L (ref 0–37)
Albumin: 4.6 g/dL (ref 3.5–5.2)
BUN: 14 mg/dL (ref 6–23)
Calcium: 9.7 mg/dL (ref 8.4–10.5)
Chloride: 101 mEq/L (ref 96–112)
Potassium: 4.5 mEq/L (ref 3.5–5.3)

## 2010-07-11 LAB — GC/CHLAMYDIA PROBE AMP, URINE
Chlamydia, Swab/Urine, PCR: NEGATIVE
GC Probe Amp, Urine: NEGATIVE

## 2010-07-23 ENCOUNTER — Ambulatory Visit: Payer: Self-pay | Admitting: Adult Health

## 2010-07-24 ENCOUNTER — Ambulatory Visit (INDEPENDENT_AMBULATORY_CARE_PROVIDER_SITE_OTHER): Payer: Self-pay | Admitting: Adult Health

## 2010-07-24 ENCOUNTER — Encounter: Payer: Self-pay | Admitting: Adult Health

## 2010-07-24 DIAGNOSIS — R238 Other skin changes: Secondary | ICD-10-CM

## 2010-07-24 DIAGNOSIS — M1990 Unspecified osteoarthritis, unspecified site: Secondary | ICD-10-CM

## 2010-07-24 DIAGNOSIS — L853 Xerosis cutis: Secondary | ICD-10-CM

## 2010-07-24 DIAGNOSIS — B20 Human immunodeficiency virus [HIV] disease: Secondary | ICD-10-CM

## 2010-07-24 MED ORDER — HYDROCODONE-ACETAMINOPHEN 5-500 MG PO TABS: 1.0000 | ORAL_TABLET | Freq: Four times a day (QID) | ORAL | Status: DC | PRN

## 2010-07-24 NOTE — Progress Notes (Signed)
  Subjective:    Patient ID: Chad Little, male    DOB: Jun 16, 1960, 50 y.o.   MRN: [id number]  HPI Presents today for routine scheduled followup. Remains adherent to his antiretrovirals with good tolerance. No complications. Complaints of ongoing, chronic osteoarthritis pain to joints in his knees, ankles, and elbows. Also relates that he is having "skin problems" to his face. Denies development of rash or facial irritation.   Review of Systems  Constitutional: Negative.   HENT: Negative.   Eyes: Negative.   Respiratory: Negative.   Cardiovascular: Negative.   Gastrointestinal: Negative.   Genitourinary: Negative.   Musculoskeletal: Positive for back pain and arthralgias.  Skin:       As per history of present illness  Neurological: Negative.   Hematological: Negative.   Psychiatric/Behavioral: Negative.        Objective:   Physical Exam  Constitutional: He is oriented to person, place, and time. He appears well-developed and well-nourished. No distress.  HENT:  Head: Normocephalic and atraumatic.  Eyes: Conjunctivae and EOM are normal. Pupils are equal, round, and reactive to light.  Neck: Normal range of motion. Neck supple.  Cardiovascular: Normal rate, regular rhythm, normal heart sounds and intact distal pulses.   Pulmonary/Chest: Effort normal and breath sounds normal.  Abdominal: Soft. Bowel sounds are normal.  Musculoskeletal: Normal range of motion.  Neurological: He is alert and oriented to person, place, and time. No cranial nerve deficit. He exhibits normal muscle tone. Coordination normal.  Skin: Skin is warm and dry.       No active rash or seborrheic patches noted to the face. Some dry patches of skin noted only.  Psychiatric: He has a normal mood and affect. His behavior is normal. Judgment and thought content normal.          Assessment & Plan:  1. HIV. Labs obtained 07/10/2010 show a CD4 count of 240 with 12% with a viral load of 31 copies/mL.  While there appears to be a decline in CD4 count and percent, his viral load has made a remarkable improvement, demonstrating good virologic control. The decline in CD4 count and CD4 percent maybe residual from previous elevated levels of viral replication. Recommend continuing present management, repeating labs in 10 weeks and following up in 3 months.  2. Osteoarthritis Pain. We'll renew his hydrocodone/APAP with a new pain contract.  3. Dry. Skin. Recommended using nonirritating, facial soap and a good hydrophilic cream to his face daily. Routine skin hygiene was reviewed in detail with him.  He verbally acknowledged all information that was provided to him and agreed with plan of care.

## 2010-08-17 ENCOUNTER — Telehealth: Payer: Self-pay | Admitting: *Deleted

## 2010-08-17 NOTE — Telephone Encounter (Signed)
Patient wanted to make an appointment with Nida Boatman NP to discuss filing for disability.  Explained to him that Nida Boatman would need to go over his diagnosis and see if he feels he qualifies.  I let Nida Boatman know that he scheduled an appointment. Wendall Mola CMA

## 2010-08-20 ENCOUNTER — Encounter: Payer: Self-pay | Admitting: Adult Health

## 2010-08-20 ENCOUNTER — Ambulatory Visit (INDEPENDENT_AMBULATORY_CARE_PROVIDER_SITE_OTHER): Payer: Self-pay | Admitting: Adult Health

## 2010-08-20 DIAGNOSIS — B20 Human immunodeficiency virus [HIV] disease: Secondary | ICD-10-CM

## 2010-08-20 DIAGNOSIS — M1990 Unspecified osteoarthritis, unspecified site: Secondary | ICD-10-CM

## 2010-08-20 NOTE — Progress Notes (Signed)
Presents to clinic today requesting an evaluation as to whether or not he might be eligible for disability. His grounds, he states, is with respect to his osteoarthritis, and gout. He openly admits that these conditions have not completely prohibited him from doing activities of daily living or gainful employment. He states while they are difficult at times, they do not cause unnecessary loss of work. We'll discuss this matter for a considerable period of time and outlined what might be an appropriate response to a disability claim. He verbalizes understanding of this information, and the probabilities of him receiving disability at this point in time. We offered to refer him to an orthopedic specialist if he felt further evaluation. Might be necessary. He declined for the time being. He is scheduled for repeat labs in early September with a followup in September. We will readdress these issues. At that time if need be.

## 2010-08-22 ENCOUNTER — Other Ambulatory Visit: Payer: Self-pay | Admitting: Licensed Clinical Social Worker

## 2010-08-22 DIAGNOSIS — M1990 Unspecified osteoarthritis, unspecified site: Secondary | ICD-10-CM

## 2010-08-22 MED ORDER — HYDROCODONE-ACETAMINOPHEN 5-500 MG PO TABS: 1.0000 | ORAL_TABLET | Freq: Four times a day (QID) | ORAL | Status: DC | PRN

## 2010-09-05 ENCOUNTER — Telehealth: Payer: Self-pay | Admitting: *Deleted

## 2010-09-05 NOTE — Telephone Encounter (Signed)
He had left a message asking for refill on his vicodin. He is not due until 09/22/10 & has refills left. I tried to call him but his phone was d/c'd. Will tell him this when he calls back

## 2010-09-10 ENCOUNTER — Other Ambulatory Visit: Payer: Self-pay | Admitting: *Deleted

## 2010-09-10 MED ORDER — COLCHICINE 0.6 MG PO TABS: 0.6000 mg | ORAL_TABLET | Freq: Every day | ORAL | Status: DC

## 2010-09-10 NOTE — Telephone Encounter (Signed)
Bottle of 60 colcrys came in mail today. Pt notified. Pt will pick up the colcrys tomorrow

## 2010-09-19 ENCOUNTER — Other Ambulatory Visit: Payer: Self-pay | Admitting: *Deleted

## 2010-09-19 DIAGNOSIS — M1990 Unspecified osteoarthritis, unspecified site: Secondary | ICD-10-CM

## 2010-09-19 MED ORDER — HYDROCODONE-ACETAMINOPHEN 5-500 MG PO TABS: 1.0000 | ORAL_TABLET | Freq: Four times a day (QID) | ORAL | Status: DC | PRN

## 2010-10-23 ENCOUNTER — Ambulatory Visit: Payer: Self-pay

## 2010-10-25 ENCOUNTER — Other Ambulatory Visit: Payer: Self-pay | Admitting: *Deleted

## 2010-10-25 ENCOUNTER — Ambulatory Visit: Payer: Self-pay

## 2010-10-25 DIAGNOSIS — M1990 Unspecified osteoarthritis, unspecified site: Secondary | ICD-10-CM

## 2010-10-25 MED ORDER — HYDROCODONE-ACETAMINOPHEN 5-500 MG PO TABS: 1.0000 | ORAL_TABLET | Freq: Four times a day (QID) | ORAL | Status: DC | PRN

## 2010-11-01 ENCOUNTER — Other Ambulatory Visit: Payer: Self-pay | Admitting: *Deleted

## 2010-11-01 NOTE — Telephone Encounter (Signed)
Wants the vicoden refilled. States he got #50 last week. Told him that is for 1 month. Not due until 1 month from day he got it. Suggested alternative ways to handle pain. May try otc. If he needs this as often as he is taking, will need to be seen.

## 2010-11-07 ENCOUNTER — Other Ambulatory Visit: Payer: Self-pay | Admitting: *Deleted

## 2010-11-07 ENCOUNTER — Telehealth: Payer: Self-pay | Admitting: *Deleted

## 2010-11-07 ENCOUNTER — Other Ambulatory Visit: Payer: Self-pay

## 2010-11-07 NOTE — Telephone Encounter (Signed)
Pt states he is out of his vicodin. Wants refill. Told him not due until 11/24/10. He disputes this. States he gets #50 every 2 weeks. I reviewed chart & called his pharmacy. He is getting it more often than #50 per month. 2 of the refills had additional refills on it. The pharmacy states he has no refill left. Pain contract shows #50 per 30 days at [facility]  [address] I called pt back at number in chart. It has been discontinued. When he calls again, I will let him know that he can only get #50 per 30 days. He stated earlier that OTC(aleve, ibu,tylenol, etc) do not help much.

## 2010-11-07 NOTE — Telephone Encounter (Signed)
Pt requesting Vicodin refill.  Pt obtained Vicodin rx on 10/25/10 from B. Sundra Aland, NP.  Too soon to obtain refill.  RN attempted phone call to above number and cell phone number.  Unable to leave message at either phone number.  Jennet Maduro, RN

## 2010-11-08 ENCOUNTER — Other Ambulatory Visit: Payer: Self-pay

## 2010-11-12 ENCOUNTER — Other Ambulatory Visit (INDEPENDENT_AMBULATORY_CARE_PROVIDER_SITE_OTHER): Payer: Self-pay

## 2010-11-12 ENCOUNTER — Other Ambulatory Visit: Payer: Self-pay | Admitting: Infectious Diseases

## 2010-11-12 DIAGNOSIS — Z113 Encounter for screening for infections with a predominantly sexual mode of transmission: Secondary | ICD-10-CM

## 2010-11-12 DIAGNOSIS — B20 Human immunodeficiency virus [HIV] disease: Secondary | ICD-10-CM

## 2010-11-12 DIAGNOSIS — Z79899 Other long term (current) drug therapy: Secondary | ICD-10-CM

## 2010-11-12 LAB — URINALYSIS, ROUTINE W REFLEX MICROSCOPIC
Hgb urine dipstick: NEGATIVE
Ketones, ur: NEGATIVE mg/dL
Leukocytes, UA: NEGATIVE
Nitrite: NEGATIVE
Protein, ur: NEGATIVE mg/dL
Urobilinogen, UA: 0.2 mg/dL (ref 0.0–1.0)

## 2010-11-13 LAB — I-STAT 8, (EC8 V) (CONVERTED LAB)
BUN: 9
Bicarbonate: 25 — ABNORMAL HIGH
Chloride: 105
Glucose, Bld: 122 — ABNORMAL HIGH
HCT: 49
Hemoglobin: 16.7
Operator id: 294341
Potassium: 3.9
Sodium: 139
TCO2: 26
pCO2, Ven: 39.6 — ABNORMAL LOW
pH, Ven: 7.409 — ABNORMAL HIGH

## 2010-11-13 LAB — LIPID PANEL
Cholesterol: 176 mg/dL (ref 0–200)
LDL Cholesterol: 79 mg/dL (ref 0–99)
VLDL: 18 mg/dL (ref 0–40)

## 2010-11-13 LAB — CBC
HCT: 43.4
MCHC: 34.3
MCV: 90.1
Platelets: 229
WBC: 6.2

## 2010-11-13 LAB — CBC WITH DIFFERENTIAL/PLATELET
Hemoglobin: 16.6 g/dL (ref 13.0–17.0)
Lymphocytes Relative: 40 % (ref 12–46)
Lymphs Abs: 2.5 10*3/uL (ref 0.7–4.0)
Monocytes Relative: 9 % (ref 3–12)
Neutro Abs: 3 10*3/uL (ref 1.7–7.7)
Neutrophils Relative %: 48 % (ref 43–77)
Platelets: 283 10*3/uL (ref 150–400)
RBC: 5.24 MIL/uL (ref 4.22–5.81)
WBC: 6.2 10*3/uL (ref 4.0–10.5)

## 2010-11-13 LAB — HIV-1 RNA QUANT-NO REFLEX-BLD
HIV 1 RNA Quant: <20 copies/mL (ref <20)
HIV-1 RNA Quant, Log: <1.3 {Log} (ref <1.30)

## 2010-11-13 LAB — COMPLETE METABOLIC PANEL WITH GFR
ALT: 20 U/L (ref 0–53)
AST: 26 U/L (ref 0–37)
Alkaline Phosphatase: 113 U/L (ref 39–117)
BUN: 16 mg/dL (ref 6–23)
Calcium: 9.8 mg/dL (ref 8.4–10.5)
Chloride: 104 mEq/L (ref 96–112)
Creat: 1.26 mg/dL (ref 0.50–1.35)

## 2010-11-13 LAB — RPR

## 2010-11-13 LAB — DIFFERENTIAL
Basophils Relative: 0
Eosinophils Absolute: 0.1 — ABNORMAL LOW
Eosinophils Relative: 1
Lymphs Abs: 1.6
Neutrophils Relative %: 61

## 2010-11-13 LAB — POCT CARDIAC MARKERS
CKMB, poc: 1
Myoglobin, poc: 116
Operator id: 133351
Troponin i, poc: <0.05

## 2010-11-13 LAB — POCT I-STAT CREATININE
Creatinine, Ser: 1.4
Operator id: 294341

## 2010-11-13 LAB — GC/CHLAMYDIA PROBE AMP, URINE
Chlamydia, Swab/Urine, PCR: NEGATIVE
GC Probe Amp, Urine: NEGATIVE

## 2010-11-21 ENCOUNTER — Other Ambulatory Visit: Payer: Self-pay | Admitting: *Deleted

## 2010-11-21 ENCOUNTER — Ambulatory Visit (INDEPENDENT_AMBULATORY_CARE_PROVIDER_SITE_OTHER): Payer: Self-pay | Admitting: Infectious Diseases

## 2010-11-21 ENCOUNTER — Encounter: Payer: Self-pay | Admitting: Infectious Diseases

## 2010-11-21 VITALS — BP 150/95 | HR 71 | Temp 97.6°F | Ht 77.0 in | Wt 222.0 lb

## 2010-11-21 DIAGNOSIS — M1990 Unspecified osteoarthritis, unspecified site: Secondary | ICD-10-CM

## 2010-11-21 DIAGNOSIS — B20 Human immunodeficiency virus [HIV] disease: Secondary | ICD-10-CM

## 2010-11-21 DIAGNOSIS — M109 Gout, unspecified: Secondary | ICD-10-CM

## 2010-11-21 DIAGNOSIS — B1710 Acute hepatitis C without hepatic coma: Secondary | ICD-10-CM

## 2010-11-21 DIAGNOSIS — Z23 Encounter for immunization: Secondary | ICD-10-CM

## 2010-11-21 DIAGNOSIS — R030 Elevated blood-pressure reading, without diagnosis of hypertension: Secondary | ICD-10-CM

## 2010-11-21 DIAGNOSIS — Z79899 Other long term (current) drug therapy: Secondary | ICD-10-CM

## 2010-11-21 DIAGNOSIS — Z113 Encounter for screening for infections with a predominantly sexual mode of transmission: Secondary | ICD-10-CM

## 2010-11-21 LAB — HEPATITIS B SURFACE ANTIBODY,QUALITATIVE

## 2010-11-21 MED ORDER — HYDROCODONE-ACETAMINOPHEN 5-500 MG PO TABS: 1.0000 | ORAL_TABLET | Freq: Four times a day (QID) | ORAL | Status: DC | PRN

## 2010-11-21 NOTE — Assessment & Plan Note (Signed)
Continues to be elavated . Will f/u with NP Sundra Aland at his next visit.

## 2010-11-21 NOTE — Assessment & Plan Note (Signed)
He is Hep A ab +, has gotten irregular HEp B vax. Will check his SAb. F/u LFTs next visit.

## 2010-11-21 NOTE — Assessment & Plan Note (Signed)
Will refill his vicodin. He needs to make it last the whole month.

## 2010-11-21 NOTE — Assessment & Plan Note (Signed)
He is doing very well since restarting his meds. He will continue on his atripla. Gets flu shot and condoms today. Will see him back in 6 months. He wants to get his PSA checked today as well.

## 2010-11-21 NOTE — Progress Notes (Signed)
  Subjective:    Patient ID: Chad Little, male    DOB: August 31, 1960, 50 y.o.   MRN: [id number]  HPI    Review of Systems  Constitutional: Negative for fever, chills and unexpected weight change.  Gastrointestinal: Negative for diarrhea and constipation.  Genitourinary: Negative for dysuria.  Musculoskeletal: Positive for arthralgias.       Objective:   Physical Exam  Constitutional: He appears well-developed and well-nourished.  Eyes: EOM are normal. Pupils are equal, round, and reactive to light.  Neck: Neck supple.  Cardiovascular: Normal rate, regular rhythm and normal heart sounds.   Pulmonary/Chest: Effort normal and breath sounds normal.          Assessment & Plan:   Subjective:    Chad Little is a 50 y.o. male who presents to the Infectious Disease clinic for follow-up of HIV infection. He is feeling  since his last visit. Prior to his previous visit he had quit taking atripla. His CD4 had dropped and his Vl had increased as well.  He restarted after his visit and his most recent CD4 was 750 and his VL < 20 (11-12-10). States that he has had pain since his previous visit. He has been taking vicodin every 6 hours as needed (and he has taken every 6 hours).   Review of Systems     Objective:     Laboratory:      Assessment:  .    Plan:

## 2010-12-19 ENCOUNTER — Telehealth: Payer: Self-pay | Admitting: *Deleted

## 2010-12-19 NOTE — Telephone Encounter (Signed)
Patient called for refill on Colcrys and Vicodin.  Called Colcrys refill to Patient Assistance and told patient that the Vicodin refill would be due on Friday and I would call it in for him. Wendall Mola CMA

## 2010-12-21 ENCOUNTER — Other Ambulatory Visit: Payer: Self-pay | Admitting: *Deleted

## 2010-12-21 DIAGNOSIS — M1990 Unspecified osteoarthritis, unspecified site: Secondary | ICD-10-CM

## 2010-12-21 DIAGNOSIS — M109 Gout, unspecified: Secondary | ICD-10-CM

## 2010-12-21 MED ORDER — HYDROCODONE-ACETAMINOPHEN 5-500 MG PO TABS: 1.0000 | ORAL_TABLET | Freq: Four times a day (QID) | ORAL | Status: DC | PRN

## 2010-12-21 NOTE — Telephone Encounter (Signed)
Rx called to [facility] on [address] [place] CMA

## 2010-12-24 ENCOUNTER — Other Ambulatory Visit: Payer: Self-pay | Admitting: *Deleted

## 2010-12-24 DIAGNOSIS — B20 Human immunodeficiency virus [HIV] disease: Secondary | ICD-10-CM

## 2010-12-24 MED ORDER — EFAVIRENZ-EMTRICITAB-TENOFOVIR 600-200-300 MG PO TABS: 1.0000 | ORAL_TABLET | Freq: Every day | ORAL | Status: DC

## 2010-12-25 ENCOUNTER — Other Ambulatory Visit: Payer: Self-pay | Admitting: *Deleted

## 2010-12-25 DIAGNOSIS — B20 Human immunodeficiency virus [HIV] disease: Secondary | ICD-10-CM

## 2010-12-25 MED ORDER — EFAVIRENZ-EMTRICITAB-TENOFOVIR 600-200-300 MG PO TABS: 1.0000 | ORAL_TABLET | Freq: Every day | ORAL | Status: DC

## 2011-01-08 ENCOUNTER — Telehealth: Payer: Self-pay | Admitting: Infectious Diseases

## 2011-01-08 NOTE — Telephone Encounter (Signed)
Called [facility] Patient Assistance to check status of application for Colcrys.  The application has been approved.  The medication was shipped and returned due to the address being incorrect.  They will overnight to the office as soon as they get every thing set up.  I will check tomorrow to get the status.

## 2011-01-09 ENCOUNTER — Telehealth: Payer: Self-pay | Admitting: Infectious Diseases

## 2011-01-09 NOTE — Telephone Encounter (Signed)
Called [facility] about Chad Little's Colcrys.  They over nighted it yesterday.  It should be here no later than tomorrow.  If not received, I will call the company back.  Called Chad Little and left a voice mail telling him that his medication should be here tomorrow.

## 2011-01-21 ENCOUNTER — Other Ambulatory Visit: Payer: Self-pay | Admitting: *Deleted

## 2011-01-21 DIAGNOSIS — M109 Gout, unspecified: Secondary | ICD-10-CM

## 2011-01-21 DIAGNOSIS — M1990 Unspecified osteoarthritis, unspecified site: Secondary | ICD-10-CM

## 2011-01-21 MED ORDER — HYDROCODONE-ACETAMINOPHEN 5-500 MG PO TABS: 1.0000 | ORAL_TABLET | Freq: Four times a day (QID) | ORAL | Status: DC | PRN

## 2011-02-11 ENCOUNTER — Telehealth: Payer: Self-pay | Admitting: Infectious Diseases

## 2011-02-11 NOTE — Telephone Encounter (Signed)
Chad Little came by and picked up his colcrys today.

## 2011-02-21 ENCOUNTER — Other Ambulatory Visit: Payer: Self-pay | Admitting: Licensed Clinical Social Worker

## 2011-02-21 DIAGNOSIS — M109 Gout, unspecified: Secondary | ICD-10-CM

## 2011-02-21 DIAGNOSIS — M1990 Unspecified osteoarthritis, unspecified site: Secondary | ICD-10-CM

## 2011-02-21 MED ORDER — HYDROCODONE-ACETAMINOPHEN 5-500 MG PO TABS: 1.0000 | ORAL_TABLET | Freq: Four times a day (QID) | ORAL | Status: DC | PRN

## 2011-03-22 ENCOUNTER — Other Ambulatory Visit: Payer: Self-pay | Admitting: *Deleted

## 2011-03-22 DIAGNOSIS — M109 Gout, unspecified: Secondary | ICD-10-CM

## 2011-03-22 DIAGNOSIS — M1990 Unspecified osteoarthritis, unspecified site: Secondary | ICD-10-CM

## 2011-03-22 MED ORDER — HYDROCODONE-ACETAMINOPHEN 5-500 MG PO TABS: 1.0000 | ORAL_TABLET | Freq: Four times a day (QID) | ORAL | Status: DC | PRN

## 2011-03-22 NOTE — Telephone Encounter (Signed)
Patient called requesting refill on Vicodin, he does have a pain contract.  Rx called to [facility] on [address].   Wendall Mola CMA

## 2011-04-15 ENCOUNTER — Other Ambulatory Visit: Payer: Self-pay

## 2011-04-19 ENCOUNTER — Other Ambulatory Visit: Payer: Self-pay | Admitting: *Deleted

## 2011-04-19 ENCOUNTER — Telehealth: Payer: Self-pay | Admitting: *Deleted

## 2011-04-19 ENCOUNTER — Ambulatory Visit: Payer: Self-pay

## 2011-04-19 ENCOUNTER — Other Ambulatory Visit (INDEPENDENT_AMBULATORY_CARE_PROVIDER_SITE_OTHER): Payer: Self-pay

## 2011-04-19 DIAGNOSIS — Z79899 Other long term (current) drug therapy: Secondary | ICD-10-CM

## 2011-04-19 DIAGNOSIS — B20 Human immunodeficiency virus [HIV] disease: Secondary | ICD-10-CM

## 2011-04-19 DIAGNOSIS — Z113 Encounter for screening for infections with a predominantly sexual mode of transmission: Secondary | ICD-10-CM

## 2011-04-19 DIAGNOSIS — M1990 Unspecified osteoarthritis, unspecified site: Secondary | ICD-10-CM

## 2011-04-19 DIAGNOSIS — M109 Gout, unspecified: Secondary | ICD-10-CM

## 2011-04-19 LAB — T-HELPER CELL (CD4) - (RCID CLINIC ONLY)
CD4 % Helper T Cell: 29 % — ABNORMAL LOW (ref 33–55)
CD4 T Cell Abs: 500 uL (ref 400–2700)

## 2011-04-19 LAB — LIPID PANEL
Cholesterol: 169 mg/dL (ref 0–200)
Total CHOL/HDL Ratio: 2.2 Ratio

## 2011-04-19 LAB — COMPREHENSIVE METABOLIC PANEL
ALT: 13 U/L (ref 0–53)
CO2: 25 mEq/L (ref 19–32)
Calcium: 9.8 mg/dL (ref 8.4–10.5)
Chloride: 103 mEq/L (ref 96–112)
Creat: 1.39 mg/dL — ABNORMAL HIGH (ref 0.50–1.35)
Glucose, Bld: 69 mg/dL — ABNORMAL LOW (ref 70–99)
Total Protein: 7.2 g/dL (ref 6.0–8.3)

## 2011-04-19 LAB — CBC
Hemoglobin: 16.6 g/dL (ref 13.0–17.0)
MCV: 96.6 fL (ref 78.0–100.0)
Platelets: 294 10*3/uL (ref 150–400)
RBC: 5.03 MIL/uL (ref 4.22–5.81)
WBC: 5.2 10*3/uL (ref 4.0–10.5)

## 2011-04-19 MED ORDER — COLCHICINE 0.6 MG PO TABS: 0.6000 mg | ORAL_TABLET | Freq: Every day | ORAL | Status: DC

## 2011-04-19 MED ORDER — HYDROCODONE-ACETAMINOPHEN 5-500 MG PO TABS: 1.0000 | ORAL_TABLET | Freq: Four times a day (QID) | ORAL | Status: DC | PRN

## 2011-04-19 NOTE — Telephone Encounter (Signed)
Patient came into clinic for labs and needed refills on colchicine and vicodin.  Sent new RX to patient assistance [facility] by fax to [phone], case # [id number].  Also called Vicodin to [facility] on [address]. Wendall Mola CMA

## 2011-04-25 ENCOUNTER — Ambulatory Visit: Payer: Self-pay | Admitting: Infectious Diseases

## 2011-04-25 ENCOUNTER — Other Ambulatory Visit: Payer: Self-pay | Admitting: *Deleted

## 2011-04-25 MED ORDER — COLCHICINE 0.6 MG PO TABS: 0.6000 mg | ORAL_TABLET | Freq: Every day | ORAL | Status: DC

## 2011-04-29 ENCOUNTER — Ambulatory Visit: Payer: Self-pay | Admitting: Infectious Diseases

## 2011-05-01 ENCOUNTER — Ambulatory Visit: Payer: Self-pay | Admitting: Infectious Diseases

## 2011-05-13 ENCOUNTER — Other Ambulatory Visit (INDEPENDENT_AMBULATORY_CARE_PROVIDER_SITE_OTHER): Payer: Self-pay | Admitting: *Deleted

## 2011-05-13 ENCOUNTER — Encounter: Payer: Self-pay | Admitting: Infectious Diseases

## 2011-05-13 ENCOUNTER — Ambulatory Visit (INDEPENDENT_AMBULATORY_CARE_PROVIDER_SITE_OTHER): Payer: Self-pay | Admitting: Infectious Diseases

## 2011-05-13 VITALS — BP 128/80 | HR 81 | Temp 98.0°F | Ht 77.0 in | Wt 216.4 lb

## 2011-05-13 DIAGNOSIS — B20 Human immunodeficiency virus [HIV] disease: Secondary | ICD-10-CM

## 2011-05-13 DIAGNOSIS — M1990 Unspecified osteoarthritis, unspecified site: Secondary | ICD-10-CM

## 2011-05-13 DIAGNOSIS — Z0000 Encounter for general adult medical examination without abnormal findings: Secondary | ICD-10-CM

## 2011-05-13 DIAGNOSIS — Z23 Encounter for immunization: Secondary | ICD-10-CM

## 2011-05-13 NOTE — Assessment & Plan Note (Signed)
Will check plain films to start. He does not localize to his hip (by hx or with rotation of his hip). renew his pain meds as needed.

## 2011-05-13 NOTE — Progress Notes (Signed)
  Subjective:    Patient ID: Chad Little, male    DOB: [birth year], 51 y.o.   MRN: [id number]  HPI 51 yo M with hx of HIV+ since 2008. Has been taking atripla.  Doing well today. No problems with atripla, continues to have pain in L knee and lower back. States he has not had any investigations into this. L knee does not swell but does hurt.   HIV 1 RNA Quant (copies/mL)  Date Value  04/19/2011 <20   11/12/2010 <20   07/10/2010 31*     CD4 T Cell Abs (cmm)  Date Value  04/19/2011 500   11/12/2010 750   07/10/2010 240*       Review of Systems  Constitutional: Negative for activity change and unexpected weight change.  Gastrointestinal: Negative for diarrhea and constipation.  Genitourinary: Negative for dysuria.  Musculoskeletal: Positive for back pain and arthralgias. Negative for joint swelling.       Objective:   Physical Exam  Constitutional: He appears well-developed and well-nourished.  Eyes: EOM are normal. Pupils are equal, round, and reactive to light.  Neck: Neck supple.  Cardiovascular: Normal rate, regular rhythm and normal heart sounds.   Pulmonary/Chest: Effort normal and breath sounds normal.  Abdominal: Soft. Bowel sounds are normal. There is no tenderness.  Musculoskeletal:       Back:       Legs: Lymphadenopathy:    He has no cervical adenopathy.          Assessment & Plan:

## 2011-05-13 NOTE — Assessment & Plan Note (Signed)
He is doing very well now, adherent. Exercising. Starting Hep B series over. Will see him back in 2 months for arthritis.

## 2011-05-15 ENCOUNTER — Ambulatory Visit (HOSPITAL_COMMUNITY)
Admission: RE | Admit: 2011-05-15 | Discharge: 2011-05-15 | Disposition: A | Discharge status: Home / Self Care | Payer: Self-pay | Source: Ambulatory Visit | Attending: Infectious Diseases | Admitting: Infectious Diseases

## 2011-05-15 DIAGNOSIS — M25569 Pain in unspecified knee: Secondary | ICD-10-CM | POA: Insufficient documentation

## 2011-05-15 DIAGNOSIS — M545 Low back pain, unspecified: Secondary | ICD-10-CM | POA: Insufficient documentation

## 2011-05-15 DIAGNOSIS — M1990 Unspecified osteoarthritis, unspecified site: Secondary | ICD-10-CM | POA: Insufficient documentation

## 2011-05-21 ENCOUNTER — Other Ambulatory Visit: Payer: Self-pay | Admitting: Licensed Clinical Social Worker

## 2011-05-21 DIAGNOSIS — M1990 Unspecified osteoarthritis, unspecified site: Secondary | ICD-10-CM

## 2011-05-21 DIAGNOSIS — M109 Gout, unspecified: Secondary | ICD-10-CM

## 2011-05-21 MED ORDER — HYDROCODONE-ACETAMINOPHEN 5-500 MG PO TABS: 1.0000 | ORAL_TABLET | Freq: Four times a day (QID) | ORAL | Status: DC | PRN

## 2011-06-03 ENCOUNTER — Telehealth: Payer: Self-pay | Admitting: Licensed Clinical Social Worker

## 2011-06-03 NOTE — Telephone Encounter (Signed)
Left message that patient assistance medications arrived

## 2011-06-21 ENCOUNTER — Other Ambulatory Visit: Payer: Self-pay | Admitting: Licensed Clinical Social Worker

## 2011-06-21 DIAGNOSIS — M109 Gout, unspecified: Secondary | ICD-10-CM

## 2011-06-21 DIAGNOSIS — M1990 Unspecified osteoarthritis, unspecified site: Secondary | ICD-10-CM

## 2011-06-21 MED ORDER — HYDROCODONE-ACETAMINOPHEN 5-500 MG PO TABS: 1.0000 | ORAL_TABLET | Freq: Four times a day (QID) | ORAL | Status: DC | PRN

## 2011-07-08 ENCOUNTER — Other Ambulatory Visit: Payer: Self-pay | Admitting: *Deleted

## 2011-07-08 DIAGNOSIS — M109 Gout, unspecified: Secondary | ICD-10-CM

## 2011-07-08 MED ORDER — COLCHICINE 0.6 MG PO TABS: 0.6000 mg | ORAL_TABLET | Freq: Every day | ORAL | Status: DC

## 2011-07-22 ENCOUNTER — Other Ambulatory Visit: Payer: Self-pay | Admitting: *Deleted

## 2011-07-22 DIAGNOSIS — M109 Gout, unspecified: Secondary | ICD-10-CM

## 2011-07-22 DIAGNOSIS — M1990 Unspecified osteoarthritis, unspecified site: Secondary | ICD-10-CM

## 2011-07-22 MED ORDER — HYDROCODONE-ACETAMINOPHEN 5-500 MG PO TABS: 1.0000 | ORAL_TABLET | Freq: Four times a day (QID) | ORAL | Status: DC | PRN

## 2011-08-14 ENCOUNTER — Ambulatory Visit: Payer: Self-pay

## 2011-08-14 ENCOUNTER — Encounter: Payer: Self-pay | Admitting: Infectious Diseases

## 2011-08-14 ENCOUNTER — Ambulatory Visit (INDEPENDENT_AMBULATORY_CARE_PROVIDER_SITE_OTHER): Payer: Self-pay | Admitting: Infectious Diseases

## 2011-08-14 VITALS — BP 127/85 | HR 66 | Temp 97.5°F | Ht 77.0 in | Wt 205.0 lb

## 2011-08-14 DIAGNOSIS — B20 Human immunodeficiency virus [HIV] disease: Secondary | ICD-10-CM

## 2011-08-14 DIAGNOSIS — M1990 Unspecified osteoarthritis, unspecified site: Secondary | ICD-10-CM

## 2011-08-14 NOTE — Assessment & Plan Note (Signed)
He is doing well. Will cont his current meds. Offered/refused condoms. Asked to come back in fall for flu shot. rtc 4 months.

## 2011-08-14 NOTE — Progress Notes (Signed)
  Subjective:    Patient ID: Chad Little, male    DOB: 1960-12-13, 51 y.o.   MRN: [id number]  HPI 51 yo M with hx of HIV+ since 2008. Has been taking atripla. At last visit c/o back and knee pain. His plain films of his knee were: Mild degenerative narrowing of medial joint compartment, his back films showed degenerative changes at L5-S1.   HIV 1 RNA Quant (copies/mL)  Date Value  04/19/2011 <20   11/12/2010 <20   07/10/2010 31*     CD4 T Cell Abs (cmm)  Date Value  04/19/2011 500   11/12/2010 750   07/10/2010 240*    Today everything is doing well. Still having pain in bones- L shoulder, L lower back, L knee.  Had sun exposure over the weekend and now has heat rash on UE.   Review of Systems  Constitutional: Negative for appetite change and unexpected weight change.  Gastrointestinal: Positive for constipation. Negative for diarrhea.  Genitourinary: Negative for dysuria.  Musculoskeletal: Positive for back pain.  Neurological: Negative for weakness and numbness.       Objective:   Physical Exam  Constitutional: He appears well-developed and well-nourished.  Eyes: EOM are normal. Pupils are equal, round, and reactive to light.  Neck: Normal range of motion.  Cardiovascular: Normal rate, regular rhythm and normal heart sounds.   Pulmonary/Chest: Effort normal and breath sounds normal.  Abdominal: Soft. Bowel sounds are normal. He exhibits no distension. There is no tenderness.  Musculoskeletal:       Legs: Lymphadenopathy:    He has no cervical adenopathy.  Neurological: He has normal strength. No sensory deficit.          Assessment & Plan:

## 2011-08-14 NOTE — Assessment & Plan Note (Signed)
i suggested he could be eval PT or sports med, he declines. i suggested he continue to use prn NSAID, tylenol. Will watch.Chad KitchenMarland Little

## 2011-08-19 ENCOUNTER — Other Ambulatory Visit: Payer: Self-pay | Admitting: *Deleted

## 2011-08-19 DIAGNOSIS — M109 Gout, unspecified: Secondary | ICD-10-CM

## 2011-08-19 MED ORDER — COLCHICINE 0.6 MG PO TABS: 0.6000 mg | ORAL_TABLET | Freq: Every day | ORAL | Status: DC

## 2011-08-19 NOTE — Telephone Encounter (Signed)
Pt notified that rx arrived in office.

## 2011-08-22 ENCOUNTER — Ambulatory Visit (INDEPENDENT_AMBULATORY_CARE_PROVIDER_SITE_OTHER): Payer: Self-pay | Admitting: Internal Medicine

## 2011-08-22 ENCOUNTER — Other Ambulatory Visit: Payer: Self-pay | Admitting: *Deleted

## 2011-08-22 ENCOUNTER — Encounter: Payer: Self-pay | Admitting: Internal Medicine

## 2011-08-22 ENCOUNTER — Telehealth: Payer: Self-pay | Admitting: *Deleted

## 2011-08-22 VITALS — Temp 98.1°F | Wt 205.0 lb

## 2011-08-22 DIAGNOSIS — M1990 Unspecified osteoarthritis, unspecified site: Secondary | ICD-10-CM

## 2011-08-22 DIAGNOSIS — M109 Gout, unspecified: Secondary | ICD-10-CM

## 2011-08-22 DIAGNOSIS — B354 Tinea corporis: Secondary | ICD-10-CM

## 2011-08-22 MED ORDER — HYDROCODONE-ACETAMINOPHEN 5-500 MG PO TABS: 1.0000 | ORAL_TABLET | Freq: Four times a day (QID) | ORAL | Status: DC | PRN

## 2011-08-22 MED ORDER — HYDROXYZINE HCL 10 MG PO TABS: 10.0000 mg | ORAL_TABLET | Freq: Three times a day (TID) | ORAL | Status: AC | PRN

## 2011-08-22 MED ORDER — KETOCONAZOLE 2 % EX CREA: TOPICAL_CREAM | Freq: Every day | CUTANEOUS | Status: DC

## 2011-08-22 NOTE — Telephone Encounter (Signed)
Patient called c/o rash not improving.  Advised can take Benadryl for the itching, but if no better is probably not sun related and he needs to be seen. Left voice mail to return call. Also requested his Vicodin refill and called the Rx to [facility] pharmacy. Wendall Mola CMA

## 2011-08-23 NOTE — Progress Notes (Signed)
HIV CLINIC   RFV: sick visit-> worsening rash in the last week Subjective:    Patient ID: Chad Little, male    DOB: 03-Sep-1960, 51 y.o.   MRN: [id number]  HPI Chad Little is a 51yo M with HIV, CD 4 count of 500(29%)/ VL <20 in March on Atripla, he reports noticing 7-10 days of rash to his upper extremities, bilaterally, mostly only sun exposed area that are moderately pruritic. He denies any scratches, new laundry detergent. Not using any new products, new lotions. No fevers. He is using benadryl cream for itching without relief.  Current Outpatient Prescriptions on File Prior to Visit  Medication Sig Dispense Refill  . colchicine 0.6 MG tablet Take 1 tablet (0.6 mg total) by mouth daily.  30 tablet  0  . efavirenz-emtrictabine-tenofovir (ATRIPLA) 600-200-300 MG per tablet Take 1 tablet by mouth at bedtime.  30 tablet  6  . HYDROcodone-acetaminophen (VICODIN) 5-500 MG per tablet Take 1 tablet by mouth every 6 (six) hours as needed.  50 tablet  0  . triamcinolone (KENALOG) 0.1 % cream Apply 1 application topically 2 (two) times daily.         Active Ambulatory Problems    Diagnosis Date Noted  . HIV DISEASE 12/28/2007  . HEPATITIS C 12/28/2007  . GOUT, UNSPECIFIED 11/28/2008  . DENTAL CARIES 08/22/2008  . OSTEOARTHRITIS 12/28/2007  . TOE PAIN 08/04/2008  . INSOMNIA 12/28/2007  . ELEVATED BLOOD PRESSURE 08/22/2008   Resolved Ambulatory Problems    Diagnosis Date Noted  . No Resolved Ambulatory Problems   Past Medical History  Diagnosis Date  . HIV infection   . Gout     Review of Systems per hpi   Objective:   Physical Exam  Temp 98.1 F (36.7 C) (Oral)  Wt 205 lb (92.987 kg). Physical Exam  Constitutional: He is oriented to person, place, and time. He appears well-developed and well-nourished. No distress.  HENT:  Mouth/Throat: Oropharynx is clear and moist. No oropharyngeal exudate.  Cardiovascular: Normal rate, regular rhythm and normal heart sounds. Exam  reveals no gallop and no friction rub. No murmur heard.  Pulmonary/Chest: Effort normal and breath sounds normal. No respiratory distress. He has no wheezes.  Lymphadenopathy:  no cervical nor axillary adenopathy.   Skin: Skin is warm and dry. He has numerous macular hyperpigmented patches of flat warts to arms bilaterally, no excoriation marks noted, no open ulcers        Assessment & Plan:  ? Verruca plana = will treat with a course of ketoconazole 2% cream to apply 1-2 x daily for 7-10 days can stop once improved. Recommended to keep skin dry.  Pruritis= can do otc benadryl tabs or can fill rx for atarax.

## 2011-08-29 ENCOUNTER — Telehealth: Payer: Self-pay | Admitting: Licensed Clinical Social Worker

## 2011-08-29 NOTE — Telephone Encounter (Signed)
Patient was seen Thursday of last week 08/22/2011 for a rash and was told to call back if it's not any better. He used the prescribed medication for 3 days with no relief. Please advice

## 2011-09-03 ENCOUNTER — Telehealth: Payer: Self-pay | Admitting: *Deleted

## 2011-09-03 NOTE — Telephone Encounter (Signed)
Per Dr. Drue Second he needed to try otc hydrocortisone cream. Patient was informed by Alesia Morin, CMA.

## 2011-09-03 NOTE — Telephone Encounter (Signed)
Patient called Triage and left message that the rash he was seen for 08/22/11 is not getting any better not any worse just not any better. He wants to know what he should do. Spoke with Dr Drue Second and she advised to tell the patient to try OTC hydrocortisone cream for a couple of days and if it does not get any better to call back and we can refer him to dermatology. Patient was agreeable with this and advised he will try and give call back if it does not work.

## 2011-09-03 NOTE — Telephone Encounter (Signed)
Patient called again today stating that his rash is not any better and would like to know what to do about. Does he need another appointment or is there something else he could try.

## 2011-09-07 ENCOUNTER — Other Ambulatory Visit: Payer: Self-pay | Admitting: Infectious Diseases

## 2011-09-14 ENCOUNTER — Emergency Department (HOSPITAL_COMMUNITY)
Admission: EM | Admit: 2011-09-14 | Discharge: 2011-09-14 | Disposition: A | Discharge status: Home / Self Care | Payer: Self-pay | Attending: Emergency Medicine | Admitting: Emergency Medicine

## 2011-09-14 ENCOUNTER — Emergency Department (HOSPITAL_COMMUNITY): Payer: Self-pay

## 2011-09-14 ENCOUNTER — Encounter (HOSPITAL_COMMUNITY): Payer: Self-pay | Admitting: *Deleted

## 2011-09-14 DIAGNOSIS — M109 Gout, unspecified: Secondary | ICD-10-CM | POA: Insufficient documentation

## 2011-09-14 DIAGNOSIS — M79643 Pain in unspecified hand: Secondary | ICD-10-CM

## 2011-09-14 DIAGNOSIS — Z21 Asymptomatic human immunodeficiency virus [HIV] infection status: Secondary | ICD-10-CM | POA: Insufficient documentation

## 2011-09-14 DIAGNOSIS — M25449 Effusion, unspecified hand: Secondary | ICD-10-CM | POA: Insufficient documentation

## 2011-09-14 MED ORDER — CLINDAMYCIN HCL 150 MG PO CAPS: 300.0000 mg | ORAL_CAPSULE | Freq: Three times a day (TID) | ORAL | Status: AC

## 2011-09-14 NOTE — ED Notes (Signed)
Pt is here with right hand red swelling that he woke up with today.  Pt reports that he remembers scratching his right arm on little sharp piece of metal

## 2011-09-14 NOTE — ED Notes (Signed)
Patient transported to X-ray 

## 2011-09-14 NOTE — ED Provider Notes (Signed)
History    This chart was scribed for Gerhard Munch, MD, MD by Smitty Pluck. The patient was seen in room TR08C and the patient's care was started at 4:39PM.   CSN: [id number]  Arrival date & time 09/14/11  1617   First MD Initiated Contact with Patient 09/14/11 1626      Chief Complaint  Patient presents with  . Joint Swelling    The history is provided by the patient.   Chad Little is a 51 y.o. male who presents to the Emergency Department complaining of moderate right hand swelling and mild right hand pain onset today. Denies any pain before waking up this morning. Pt reports that he cut his hand 3 weeks ago with piece of metal. He denies any recent injury. Symptoms have been constant since onset without radiation.   Past Medical History  Diagnosis Date  . HIV infection   . Gout     History reviewed. No pertinent past surgical history.  Family History  Problem Relation Age of Onset  . Alcohol abuse Mother   . Hypertension Father   . Stroke Father     History  Substance Use Topics  . Smoking status: Never Smoker   . Smokeless tobacco: Never Used  . Alcohol Use: No     "very seldom"      Review of Systems  Constitutional:       Per HPI, otherwise negative  HENT:       Per HPI, otherwise negative  Eyes: Negative.   Respiratory:       Per HPI, otherwise negative  Cardiovascular:       Per HPI, otherwise negative  Gastrointestinal: Negative for vomiting.  Genitourinary: Negative.   Musculoskeletal:       Per HPI, otherwise negative  Skin: Negative.   Neurological: Negative for syncope.    Allergies  Review of patient's allergies indicates no known allergies.  Home Medications   Current Outpatient Rx  Name Route Sig Dispense Refill  . ATRIPLA 600-200-300 MG PO TABS  TAKE 1 TABLET BY MOUTH AT BEDTIME 30 tablet 5  . COLCHICINE 0.6 MG PO TABS Oral Take 1 tablet (0.6 mg total) by mouth daily. 30 tablet 0    Rx# [id number], exp 08/13/12, [facility]  [id number] ...  . HYDROCODONE-ACETAMINOPHEN 5-500 MG PO TABS Oral Take 1 tablet by mouth every 6 (six) hours as needed. 50 tablet 0  . KETOCONAZOLE 2 % EX CREA Topical Apply topically daily. Apply 1-2 x per day to affected area for 14 days. 30 g 1  . TRIAMCINOLONE ACETONIDE 0.1 % EX CREA Topical Apply 1 application topically 2 (two) times daily.        BP 126/93  Pulse 84  Temp 97.7 F (36.5 C) (Oral)  Resp 20  SpO2 99%  Physical Exam  Nursing note and vitals reviewed. Constitutional: He is oriented to person, place, and time. He appears well-developed and well-nourished. No distress.  HENT:  Head: Normocephalic and atraumatic.  Pulmonary/Chest: Effort normal. No respiratory distress.  Musculoskeletal:       Arms:      Full ROM of wrist and all digits of right hand   Neurological: He is alert and oriented to person, place, and time.  Skin: Skin is warm and dry.  Psychiatric: He has a normal mood and affect. His behavior is normal.    ED Course  Procedures (including critical care time)  COORDINATION OF CARE: 4:43PM EDP discusses pt ED treatment  with pt     Labs Reviewed - No data to display Dg Hand Complete Right  09/14/2011  *RADIOLOGY REPORT*  Clinical Data: Pain/swelling of the right fifth digit  RIGHT HAND - COMPLETE 3+ VIEW  Comparison: 07/15/2007  Findings: No evidence of acute fracture or dislocation.  Deformity related to prior distal fourth and fifth metacarpal fractures.  The joint spaces are preserved.  The visualized soft tissues are grossly unremarkable.  IMPRESSION: No evidence of acute fracture or dislocation.  Deformity related to prior distal fourth and fifth metacarpal fractures.  Original Report Authenticated By: Charline Bills, M.D.     No diagnosis found.    MDM  I personally performed the services described in this documentation, which was scribed in my presence. The recorded information has been reviewed and considered.  This generally  well-appearing male with HIV now presents with concerns over pain about the fifth MCP joint on his hand.  On exam the patient has a seemingly old lesion consistent with prior injury.  Given this, the new swelling, there is some suspicion of retained foreign body versus contusion versus infection.  The ability to flex and extend, and he preserved neurovascular status is reassuring.  The absence of systemic signs is reassuring as well.  The patient's x-ray was unremarkable.  He was discharged with antibiotics, return precautions, and instructed that if his symptoms continue beyond 2 days, he will follow up with hand surgery.  Gerhard Munch, MD 09/14/11 [phone]

## 2011-09-17 ENCOUNTER — Telehealth: Payer: Self-pay | Admitting: *Deleted

## 2011-09-17 NOTE — Telephone Encounter (Signed)
Tried the OTC hydrocortisone cream without results.  Rash present and itching at night continues.  Requesting another appt.  Unable to come today.  Scheduler gave pt an appt for Friday, Aug. 16.

## 2011-09-20 ENCOUNTER — Encounter: Payer: Self-pay | Admitting: Internal Medicine

## 2011-09-20 ENCOUNTER — Ambulatory Visit (INDEPENDENT_AMBULATORY_CARE_PROVIDER_SITE_OTHER): Payer: Self-pay | Admitting: Internal Medicine

## 2011-09-20 VITALS — Temp 97.8°F | Wt 203.0 lb

## 2011-09-20 DIAGNOSIS — R52 Pain, unspecified: Secondary | ICD-10-CM

## 2011-09-20 MED ORDER — HYDROCODONE-ACETAMINOPHEN 5-500 MG PO TABS: 1.0000 | ORAL_TABLET | Freq: Four times a day (QID) | ORAL | Status: DC | PRN

## 2011-09-20 NOTE — Progress Notes (Signed)
HIV CLINIC NOTE  RFV: ongoing rash despite antifungal and hydrocortisone cream  HPI  [name] is a 51yo M with HIV, CD 4 count of 500(29%)/ VL <20 in March on Atripla, he reports noticing 3-4 wk of rash to his upper extremities, bilaterally, mostly only sun exposed area that are moderately pruritic, most noticeable in the evening. He denies any scratches, new laundry detergent. Not using any new products, new lotions. No fevers. He is using benadryl cream for itching without relief.   He has done a 2 wk trial of antifungal which did not work, then he switched to doing a trial of hydrocortisone cream which has not helped either. He states he feels that the areas of involvement are slightly worse, but still localized to his arms.  Current Outpatient Prescriptions on File Prior to Visit   Medication  Sig  Dispense  Refill   .  colchicine 0.6 MG tablet  Take 1 tablet (0.6 mg total) by mouth daily.  30 tablet  0   .  efavirenz-emtrictabine-tenofovir (ATRIPLA) 600-200-300 MG per tablet  Take 1 tablet by mouth at bedtime.  30 tablet  6   .  HYDROcodone-acetaminophen (VICODIN) 5-500 MG per tablet  Take 1 tablet by mouth every 6 (six) hours as needed.  50 tablet  0   .  triamcinolone (KENALOG) 0.1 % cream  Apply 1 application topically 2 (two) times daily.      Active Ambulatory Problems    Diagnosis  Date Noted   .  HIV DISEASE  12/28/2007   .  HEPATITIS C  12/28/2007   .  GOUT, UNSPECIFIED  11/28/2008   .  DENTAL CARIES  08/22/2008   .  OSTEOARTHRITIS  12/28/2007   .  TOE PAIN  08/04/2008   .  INSOMNIA  12/28/2007   .  ELEVATED BLOOD PRESSURE  08/22/2008    Resolved Ambulatory Problems    Diagnosis  Date Noted   .  No Resolved Ambulatory Problems    Past Medical History   Diagnosis  Date   .  HIV infection    .  Gout    Review of Systems  per hpi  Objective:   Physical Exam  Temp 98.1 F (36.7 C) (Oral)  Wt 205 lb (92.987 kg).  Physical Exam  Constitutional: He is oriented to  person, place, and time. He appears well-developed and well-nourished. No distress.  HENT:  Mouth/Throat: Oropharynx is clear and moist. No oropharyngeal exudate.  Cardiovascular: Normal rate, regular rhythm and normal heart sounds. Exam reveals no gallop and no friction rub. No murmur heard.  Pulmonary/Chest: Effort normal and breath sounds normal. No respiratory distress. He has no wheezes.  Lymphadenopathy: no cervical nor axillary adenopathy.  Skin: Skin is warm and dry. He has areas of hypopigmentation with macular plaque, no excoriation marks noted, no open ulcers. Right shin has an area of hyperpigmentation that ? Concerning for ks. Assessment & Plan:    Rash to arms = previously hyperpigmented but now hypopigmented base. Not responding to ketoconazole nor hydrocortisone. Still pruritic. Will continue to ask patient to use benadryl for itching, keep area dry out of sun. Will seek derm eval.  Lesion to right shin= continue to monitor change in size. Patient states it has been there for 6-7 months  Pain = refill percocet

## 2011-09-24 ENCOUNTER — Telehealth: Payer: Self-pay | Admitting: *Deleted

## 2011-09-24 NOTE — Telephone Encounter (Signed)
Please ask patient not to use any further creams on his rash for the next 3 days and see if it improves on its own

## 2011-09-24 NOTE — Telephone Encounter (Signed)
Patient called c/o worsening rash, the triamcinolone and kenalog cream seem to be making it worse.  Dr. Drue Second took a picture of it and wanted him to be seen by Dermatology, but patient is uninsured. Please advise Chad Little CMA

## 2011-09-24 NOTE — Telephone Encounter (Signed)
Advised patient to try to use only unscented laundry soap and body soap, and no creams.  He will call back on Friday and let us know if there is any improvement.  We may be able to get him into Dermatology at the beginning of Sept  when new slots open up through [facility]. Wendall Mola CMA

## 2011-09-27 ENCOUNTER — Other Ambulatory Visit: Payer: Self-pay | Admitting: Internal Medicine

## 2011-09-27 DIAGNOSIS — R21 Rash and other nonspecific skin eruption: Secondary | ICD-10-CM

## 2011-09-27 MED ORDER — TRIAMCINOLONE ACETONIDE 0.5 % EX CREA: TOPICAL_CREAM | Freq: Two times a day (BID) | CUTANEOUS | Status: DC

## 2011-09-27 NOTE — Progress Notes (Signed)
  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 And much rarer condition that can sometimes look like this is hypopigmented mycosis fungoides but a biopsy would be necessary for that.   Plan to try triamcinolone 0.5% cream as high potency cream to affected areas BID x 14 days then come back to clinic for eval. To see if needs derm referral.

## 2011-10-01 ENCOUNTER — Other Ambulatory Visit: Payer: Self-pay | Admitting: Licensed Clinical Social Worker

## 2011-10-01 ENCOUNTER — Other Ambulatory Visit: Payer: Self-pay | Admitting: *Deleted

## 2011-10-01 DIAGNOSIS — R21 Rash and other nonspecific skin eruption: Secondary | ICD-10-CM

## 2011-10-01 DIAGNOSIS — M109 Gout, unspecified: Secondary | ICD-10-CM

## 2011-10-01 MED ORDER — COLCHICINE 0.6 MG PO TABS: 0.6000 mg | ORAL_TABLET | Freq: Every day | ORAL | Status: DC

## 2011-10-01 MED ORDER — TRIAMCINOLONE ACETONIDE 0.5 % EX CREA: TOPICAL_CREAM | Freq: Two times a day (BID) | CUTANEOUS | Status: DC

## 2011-10-01 NOTE — Telephone Encounter (Signed)
Rx printed to go with patient PAP application. 

## 2011-10-21 ENCOUNTER — Telehealth: Payer: Self-pay | Admitting: Infectious Diseases

## 2011-10-21 NOTE — Telephone Encounter (Signed)
Faxed and mailed Mr. Pha application to [facility] Patient Assistance, for his Colcrys, today

## 2011-10-22 ENCOUNTER — Other Ambulatory Visit: Payer: Self-pay | Admitting: *Deleted

## 2011-10-22 DIAGNOSIS — R52 Pain, unspecified: Secondary | ICD-10-CM

## 2011-10-28 ENCOUNTER — Telehealth: Payer: Self-pay | Admitting: Infectious Diseases

## 2011-10-28 NOTE — Telephone Encounter (Signed)
Received a fax from [employer] stating Chad Little has been approved for His Colcrys until 10-21-12.  I will inform Chad Little.

## 2011-11-13 ENCOUNTER — Telehealth: Payer: Self-pay | Admitting: Infectious Diseases

## 2011-11-13 NOTE — Telephone Encounter (Signed)
I called the patient assistance company for [name] to find out when and where the medication was shipped.  It was sent to Chad Little old address.  I told Chad Little he had to call and give them the correct address.  The company would not take it from me.  He is calling today.

## 2011-11-20 ENCOUNTER — Telehealth: Payer: Self-pay | Admitting: Infectious Diseases

## 2011-11-20 NOTE — Telephone Encounter (Signed)
Received V/M from Mr. Gumm.  I returned his call.  He needed the phone number for [employer] for his Colcrys.  I gave him the number.

## 2011-11-21 ENCOUNTER — Other Ambulatory Visit: Payer: Self-pay | Admitting: Licensed Clinical Social Worker

## 2011-11-21 DIAGNOSIS — R52 Pain, unspecified: Secondary | ICD-10-CM

## 2011-11-21 MED ORDER — HYDROCODONE-ACETAMINOPHEN 5-500 MG PO TABS: 1.0000 | ORAL_TABLET | Freq: Four times a day (QID) | ORAL | Status: DC | PRN

## 2011-11-27 ENCOUNTER — Other Ambulatory Visit: Payer: Self-pay

## 2011-11-27 ENCOUNTER — Other Ambulatory Visit (INDEPENDENT_AMBULATORY_CARE_PROVIDER_SITE_OTHER): Payer: Self-pay

## 2011-11-27 DIAGNOSIS — B20 Human immunodeficiency virus [HIV] disease: Secondary | ICD-10-CM

## 2011-11-27 LAB — CBC
HCT: 45.9 % (ref 39.0–52.0)
Hemoglobin: 16.5 g/dL (ref 13.0–17.0)
RDW: 13.6 % (ref 11.5–15.5)
WBC: 5.9 10*3/uL (ref 4.0–10.5)

## 2011-11-28 LAB — T-HELPER CELL (CD4) - (RCID CLINIC ONLY): CD4 T Cell Abs: 440 uL (ref 400–2700)

## 2011-11-28 LAB — COMPLETE METABOLIC PANEL WITH GFR
AST: 47 U/L — ABNORMAL HIGH (ref 0–37)
Albumin: 4.4 g/dL (ref 3.5–5.2)
BUN: 20 mg/dL (ref 6–23)
Calcium: 9.5 mg/dL (ref 8.4–10.5)
Chloride: 103 mEq/L (ref 96–112)
Potassium: 4.3 mEq/L (ref 3.5–5.3)

## 2011-11-29 LAB — HIV-1 RNA QUANT-NO REFLEX-BLD: HIV-1 RNA Quant, Log: <1.3 {Log} (ref <1.30)

## 2011-12-11 ENCOUNTER — Ambulatory Visit (INDEPENDENT_AMBULATORY_CARE_PROVIDER_SITE_OTHER): Payer: Self-pay | Admitting: Infectious Diseases

## 2011-12-11 ENCOUNTER — Encounter: Payer: Self-pay | Admitting: Infectious Diseases

## 2011-12-11 VITALS — BP 148/88 | HR 76 | Temp 97.6°F | Wt 207.0 lb

## 2011-12-11 DIAGNOSIS — M109 Gout, unspecified: Secondary | ICD-10-CM

## 2011-12-11 DIAGNOSIS — B20 Human immunodeficiency virus [HIV] disease: Secondary | ICD-10-CM

## 2011-12-11 DIAGNOSIS — R030 Elevated blood-pressure reading, without diagnosis of hypertension: Secondary | ICD-10-CM

## 2011-12-11 DIAGNOSIS — Z23 Encounter for immunization: Secondary | ICD-10-CM

## 2011-12-11 DIAGNOSIS — B351 Tinea unguium: Secondary | ICD-10-CM | POA: Insufficient documentation

## 2011-12-11 DIAGNOSIS — B1710 Acute hepatitis C without hepatic coma: Secondary | ICD-10-CM

## 2011-12-11 DIAGNOSIS — Z113 Encounter for screening for infections with a predominantly sexual mode of transmission: Secondary | ICD-10-CM

## 2011-12-11 DIAGNOSIS — Z79899 Other long term (current) drug therapy: Secondary | ICD-10-CM

## 2011-12-11 NOTE — Assessment & Plan Note (Signed)
Would like to have another toe nail removed. Was previously eval at [address]. Will find out and re-refer him.

## 2011-12-11 NOTE — Assessment & Plan Note (Signed)
Has been very careful about his diet and what he drinks.

## 2011-12-11 NOTE — Assessment & Plan Note (Addendum)
He is doing very well. Will cont his current art. He is offered/refuses condoms. Gets flu shot today. Will see him back in 6 months. Will ask about colonoscopy at f/u

## 2011-12-11 NOTE — Assessment & Plan Note (Signed)
Borderline today, mostly asx (rare headache). Will continue to watch off meds.

## 2011-12-11 NOTE — Assessment & Plan Note (Signed)
He is Hep A immune. His Hep C VL was previously negative. I would suggest that he has been exposed and has cleared. Will repeat his VL and Ab test.

## 2011-12-11 NOTE — Progress Notes (Signed)
  Subjective:    Patient ID: Chad Little, male    DOB: 15-Aug-1960, 51 y.o.   MRN: [id number]  HPI 51 yo M with hx of HIV+ since 2008. Has been taking atripla. Has been seen this summer for rash that did not initially respond to antifungal rx (nizoral). Rash has since resolved with triamcinalone.  Feeling well. No problems with ART. Having his usual joint pain. Has been lifting wts, riding a bicycle.  HIV 1 RNA Quant (copies/mL)  Date Value  11/27/2011 <20   04/19/2011 <20   11/12/2010 <20      CD4 T Cell Abs (cmm)  Date Value  11/27/2011 440   04/19/2011 500   11/12/2010 750       Review of Systems  Constitutional: Negative for appetite change and unexpected weight change.  HENT: Positive for congestion, rhinorrhea and postnasal drip.   Respiratory: Negative for shortness of breath.   Gastrointestinal: Negative for diarrhea and constipation.  Genitourinary: Negative for difficulty urinating.  Neurological: Positive for headaches.       Objective:   Physical Exam  Constitutional: He appears well-developed and well-nourished.  HENT:  Mouth/Throat: No oropharyngeal exudate.  Eyes: EOM are normal. Pupils are equal, round, and reactive to light.  Neck: Neck supple.  Cardiovascular: Normal rate, regular rhythm and normal heart sounds.   Pulmonary/Chest: Effort normal and breath sounds normal.  Abdominal: Soft. Bowel sounds are normal. He exhibits no distension. There is no tenderness.  Musculoskeletal:       Feet:  Lymphadenopathy:    He has no cervical adenopathy.          Assessment & Plan:

## 2011-12-18 ENCOUNTER — Telehealth: Payer: Self-pay | Admitting: *Deleted

## 2011-12-18 NOTE — Telephone Encounter (Signed)
Called patient to advise that due to him not being insured and [place] not having podiatry resources for free he will have to pay up front to get a new patient appt. He advised that he has no money at this time and will just put it off for now. He advised he hopes that the new insurance reform will help him have access to care for his feet. Advised him if something does happen on our end or changes on his for him to give [place] a call and we can schedule him to have his toe checked out.

## 2011-12-20 ENCOUNTER — Other Ambulatory Visit: Payer: Self-pay | Admitting: Licensed Clinical Social Worker

## 2011-12-20 DIAGNOSIS — R52 Pain, unspecified: Secondary | ICD-10-CM

## 2011-12-20 MED ORDER — HYDROCODONE-ACETAMINOPHEN 5-500 MG PO TABS: 1.0000 | ORAL_TABLET | Freq: Four times a day (QID) | ORAL | Status: DC | PRN

## 2012-01-20 ENCOUNTER — Telehealth: Payer: Self-pay | Admitting: Licensed Clinical Social Worker

## 2012-01-20 ENCOUNTER — Other Ambulatory Visit: Payer: Self-pay | Admitting: Licensed Clinical Social Worker

## 2012-01-20 DIAGNOSIS — M1990 Unspecified osteoarthritis, unspecified site: Secondary | ICD-10-CM

## 2012-01-20 DIAGNOSIS — R52 Pain, unspecified: Secondary | ICD-10-CM

## 2012-01-20 MED ORDER — HYDROCODONE-ACETAMINOPHEN 5-500 MG PO TABS: 1.0000 | ORAL_TABLET | Freq: Four times a day (QID) | ORAL | Status: DC | PRN

## 2012-01-20 MED ORDER — HYDROCODONE-ACETAMINOPHEN 5-325 MG PO TABS: 1.0000 | ORAL_TABLET | Freq: Four times a day (QID) | ORAL | Status: DC | PRN

## 2012-01-20 NOTE — Telephone Encounter (Signed)
Patient could not afford the Norco 5/325 that [facility] [address] wanted to changed him to because they will no longer have vicodin 5/500. Patient called [facility] and they had Vicodin 5/500 at a cheaper cost. I changed it back to his original prescription and called it in to [facility].

## 2012-01-20 NOTE — Telephone Encounter (Signed)
Pharmacy called from [facility] and stated that they will no longer be carrying vicodin 5-500, and it will be discontinued everywhere. Per the pharmacist Norco 5/325 is comparable to what he is currently on. I called the patient and he was fine with this change. I updated this in the chart.

## 2012-02-21 ENCOUNTER — Other Ambulatory Visit: Payer: Self-pay | Admitting: *Deleted

## 2012-02-21 DIAGNOSIS — M1990 Unspecified osteoarthritis, unspecified site: Secondary | ICD-10-CM

## 2012-02-23 ENCOUNTER — Emergency Department (HOSPITAL_COMMUNITY): Payer: No Typology Code available for payment source

## 2012-02-23 ENCOUNTER — Emergency Department (HOSPITAL_COMMUNITY)
Admission: EM | Admit: 2012-02-23 | Discharge: 2012-02-23 | Disposition: A | Discharge status: Home / Self Care | Payer: No Typology Code available for payment source | Attending: Emergency Medicine | Admitting: Emergency Medicine

## 2012-02-23 DIAGNOSIS — S0993XA Unspecified injury of face, initial encounter: Secondary | ICD-10-CM | POA: Insufficient documentation

## 2012-02-23 DIAGNOSIS — Y939 Activity, unspecified: Secondary | ICD-10-CM | POA: Insufficient documentation

## 2012-02-23 DIAGNOSIS — T07XXXA Unspecified multiple injuries, initial encounter: Secondary | ICD-10-CM

## 2012-02-23 DIAGNOSIS — M109 Gout, unspecified: Secondary | ICD-10-CM | POA: Insufficient documentation

## 2012-02-23 DIAGNOSIS — Z79899 Other long term (current) drug therapy: Secondary | ICD-10-CM | POA: Insufficient documentation

## 2012-02-23 DIAGNOSIS — B20 Human immunodeficiency virus [HIV] disease: Secondary | ICD-10-CM | POA: Insufficient documentation

## 2012-02-23 DIAGNOSIS — Y92410 Unspecified street and highway as the place of occurrence of the external cause: Secondary | ICD-10-CM | POA: Insufficient documentation

## 2012-02-23 DIAGNOSIS — S199XXA Unspecified injury of neck, initial encounter: Secondary | ICD-10-CM | POA: Insufficient documentation

## 2012-02-23 MED ORDER — HYDROCODONE-ACETAMINOPHEN 5-325 MG PO TABS
1.0000 | ORAL_TABLET | Freq: Once | ORAL | Status: AC
  Administered 2012-02-23: 1 via ORAL
  Filled 2012-02-23: qty 1

## 2012-02-23 MED ORDER — HYDROCODONE-ACETAMINOPHEN 5-325 MG PO TABS: 1.0000 | ORAL_TABLET | ORAL | Status: DC | PRN

## 2012-02-23 NOTE — ED Notes (Signed)
MD at bedside. 

## 2012-02-23 NOTE — ED Notes (Signed)
Pt back from CT

## 2012-02-23 NOTE — ED Notes (Signed)
Pt ambulated from room to bathroom and back with no difficulty or complaints.
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
## 2012-02-23 NOTE — ED Notes (Signed)
Pt arrived on LSB, with c-collar, and Head blocks.  His CMS prior to removal was normal in all 4 extremities.  Pt was log rolled x 3 people with no changes in CMS (normal CMS).  LSB and head blocks removed.  Pt states that he has pain on palpation Lower back, mid back, and Neck.

## 2012-02-23 NOTE — ED Notes (Signed)
Patient transported to CT 
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
## 2012-03-23 ENCOUNTER — Other Ambulatory Visit: Payer: Self-pay | Admitting: *Deleted

## 2012-03-23 DIAGNOSIS — M1990 Unspecified osteoarthritis, unspecified site: Secondary | ICD-10-CM

## 2012-03-23 MED ORDER — HYDROCODONE-ACETAMINOPHEN 5-325 MG PO TABS: 1.0000 | ORAL_TABLET | Freq: Four times a day (QID) | ORAL | Status: DC | PRN

## 2012-04-05 ENCOUNTER — Other Ambulatory Visit: Payer: Self-pay | Admitting: Infectious Diseases

## 2012-04-20 ENCOUNTER — Other Ambulatory Visit: Payer: Self-pay

## 2012-04-20 DIAGNOSIS — M1990 Unspecified osteoarthritis, unspecified site: Secondary | ICD-10-CM

## 2012-04-20 MED ORDER — HYDROCODONE-ACETAMINOPHEN 5-325 MG PO TABS: 1.0000 | ORAL_TABLET | Freq: Four times a day (QID) | ORAL | Status: DC | PRN

## 2012-05-03 ENCOUNTER — Other Ambulatory Visit: Payer: Self-pay | Admitting: Infectious Diseases

## 2012-05-11 ENCOUNTER — Telehealth: Payer: Self-pay | Admitting: *Deleted

## 2012-05-11 NOTE — Telephone Encounter (Signed)
Patient called and advised he wants to have his prostate checked at his next visit. Advised him will make a note but that he needs to talk to the doctor when he is here for his visit.

## 2012-05-20 ENCOUNTER — Ambulatory Visit: Payer: Self-pay

## 2012-05-21 ENCOUNTER — Other Ambulatory Visit: Payer: Self-pay | Admitting: Licensed Clinical Social Worker

## 2012-05-21 DIAGNOSIS — M1990 Unspecified osteoarthritis, unspecified site: Secondary | ICD-10-CM

## 2012-05-21 MED ORDER — HYDROCODONE-ACETAMINOPHEN 5-325 MG PO TABS: 1.0000 | ORAL_TABLET | Freq: Four times a day (QID) | ORAL | Status: DC | PRN

## 2012-05-27 ENCOUNTER — Other Ambulatory Visit: Payer: Self-pay

## 2012-05-27 ENCOUNTER — Ambulatory Visit (INDEPENDENT_AMBULATORY_CARE_PROVIDER_SITE_OTHER): Payer: Self-pay

## 2012-06-05 ENCOUNTER — Other Ambulatory Visit: Payer: Self-pay

## 2012-06-05 ENCOUNTER — Ambulatory Visit: Payer: Self-pay

## 2012-06-05 ENCOUNTER — Other Ambulatory Visit: Payer: Self-pay | Admitting: *Deleted

## 2012-06-05 DIAGNOSIS — Z113 Encounter for screening for infections with a predominantly sexual mode of transmission: Secondary | ICD-10-CM

## 2012-06-05 DIAGNOSIS — B20 Human immunodeficiency virus [HIV] disease: Secondary | ICD-10-CM

## 2012-06-05 DIAGNOSIS — Z79899 Other long term (current) drug therapy: Secondary | ICD-10-CM

## 2012-06-05 DIAGNOSIS — B1710 Acute hepatitis C without hepatic coma: Secondary | ICD-10-CM

## 2012-06-05 LAB — CBC
Hemoglobin: 16.6 g/dL (ref 13.0–17.0)
MCH: 33 pg (ref 26.0–34.0)
MCHC: 35 g/dL (ref 30.0–36.0)
Platelets: 285 10*3/uL (ref 150–400)
RDW: 13.1 % (ref 11.5–15.5)

## 2012-06-05 LAB — LIPID PANEL
LDL Cholesterol: 74 mg/dL (ref 0–99)
Triglycerides: 41 mg/dL (ref <150)
VLDL: 8 mg/dL (ref 0–40)

## 2012-06-05 LAB — COMPREHENSIVE METABOLIC PANEL
Alkaline Phosphatase: 96 U/L (ref 39–117)
Glucose, Bld: 99 mg/dL (ref 70–99)
Sodium: 141 mEq/L (ref 135–145)
Total Bilirubin: 0.3 mg/dL (ref 0.3–1.2)
Total Protein: 7.3 g/dL (ref 6.0–8.3)

## 2012-06-05 LAB — T-HELPER CELL (CD4) - (RCID CLINIC ONLY): CD4 % Helper T Cell: 32 % — ABNORMAL LOW (ref 33–55)

## 2012-06-05 MED ORDER — EFAVIRENZ-EMTRICITAB-TENOFOVIR 600-200-300 MG PO TABS: 1.0000 | ORAL_TABLET | Freq: Every day | ORAL | Status: DC

## 2012-06-08 LAB — HIV-1 RNA QUANT-NO REFLEX-BLD: HIV 1 RNA Quant: <20 copies/mL (ref <20)

## 2012-06-10 ENCOUNTER — Ambulatory Visit: Payer: Self-pay | Admitting: Infectious Diseases

## 2012-06-10 LAB — HEPATITIS C RNA QUANTITATIVE: HCV Quantitative: NOT DETECTED IU/mL (ref <15)

## 2012-06-15 ENCOUNTER — Other Ambulatory Visit: Payer: Self-pay | Admitting: *Deleted

## 2012-06-15 DIAGNOSIS — B20 Human immunodeficiency virus [HIV] disease: Secondary | ICD-10-CM

## 2012-06-15 MED ORDER — EFAVIRENZ-EMTRICITAB-TENOFOVIR 600-200-300 MG PO TABS: 1.0000 | ORAL_TABLET | Freq: Every day | ORAL | Status: DC

## 2012-06-22 ENCOUNTER — Other Ambulatory Visit: Payer: Self-pay | Admitting: *Deleted

## 2012-06-22 DIAGNOSIS — M1990 Unspecified osteoarthritis, unspecified site: Secondary | ICD-10-CM

## 2012-06-22 MED ORDER — HYDROCODONE-ACETAMINOPHEN 5-325 MG PO TABS: 1.0000 | ORAL_TABLET | Freq: Four times a day (QID) | ORAL | Status: DC | PRN

## 2012-06-22 NOTE — Telephone Encounter (Signed)
Called Rx in for the patient

## 2012-07-06 ENCOUNTER — Ambulatory Visit (INDEPENDENT_AMBULATORY_CARE_PROVIDER_SITE_OTHER): Payer: No Typology Code available for payment source | Admitting: Infectious Diseases

## 2012-07-06 ENCOUNTER — Encounter: Payer: Self-pay | Admitting: Infectious Diseases

## 2012-07-06 VITALS — BP 137/90 | HR 85 | Temp 97.5°F | Wt 196.0 lb

## 2012-07-06 DIAGNOSIS — B1710 Acute hepatitis C without hepatic coma: Secondary | ICD-10-CM

## 2012-07-06 DIAGNOSIS — M109 Gout, unspecified: Secondary | ICD-10-CM

## 2012-07-06 DIAGNOSIS — Z23 Encounter for immunization: Secondary | ICD-10-CM

## 2012-07-06 DIAGNOSIS — B20 Human immunodeficiency virus [HIV] disease: Secondary | ICD-10-CM

## 2012-07-06 NOTE — Addendum Note (Signed)
Addended by: Lurlean Leyden on: 07/06/2012 02:58 PM   Modules accepted: Orders

## 2012-07-06 NOTE — Assessment & Plan Note (Signed)
Ab test +, VL (-). Mark as resolved.

## 2012-07-06 NOTE — Assessment & Plan Note (Signed)
Encouraged him to take NSAID or tylenol prn.

## 2012-07-06 NOTE — Assessment & Plan Note (Signed)
He's doing very well. We spoke at length about PSA testing, he would still like to get test done. He is offered refused condoms. Will get PNVX booster today. Will see him back in 6 months.

## 2012-07-06 NOTE — Progress Notes (Signed)
  Subjective:    Patient ID: Chad Little, male    DOB: May 28, 1960, 52 y.o.   MRN: [id number]  HPI 52 yo M with hx of HIV+ since 2008. Has been taking atripla. Has lost 35 # since January. Prior to that his wt loss would only be ~10#. He attributes this to stress of moving. Otherwise, feels great. Eating well. Exercising.  Medication has been going well.   HIV 1 RNA Quant (copies/mL)  Date Value  06/05/2012 <20   11/27/2011 <20   04/19/2011 <20      CD4 T Cell Abs (cmm)  Date Value  06/05/2012 600   11/27/2011 440   04/19/2011 500       Review of Systems  Constitutional: Positive for unexpected weight change. Negative for appetite change.  Respiratory: Negative for shortness of breath.   Cardiovascular: Negative for chest pain.  Gastrointestinal: Negative for diarrhea and constipation.  Genitourinary: Negative for difficulty urinating.  Neurological: Negative for headaches.       Objective:   Physical Exam  Constitutional: He appears well-developed and well-nourished.  HENT:  Mouth/Throat: No oropharyngeal exudate.  Eyes: EOM are normal. Pupils are equal, round, and reactive to light.  Neck: Neck supple.  Cardiovascular: Normal rate, regular rhythm and normal heart sounds.   Pulmonary/Chest: Effort normal and breath sounds normal.  Abdominal: Soft. Bowel sounds are normal. There is no tenderness.  Musculoskeletal: He exhibits no edema.  Lymphadenopathy:    He has no cervical adenopathy.          Assessment & Plan:

## 2012-07-17 ENCOUNTER — Other Ambulatory Visit: Payer: Self-pay | Admitting: *Deleted

## 2012-07-17 DIAGNOSIS — M109 Gout, unspecified: Secondary | ICD-10-CM

## 2012-07-17 NOTE — Telephone Encounter (Signed)
Patient called and advised that he needs his Colcrys which he gets on PAP. Advised him I will let Pam know and she is out until Monday but she will call him if she needs anything.

## 2012-07-20 ENCOUNTER — Telehealth: Payer: Self-pay | Admitting: *Deleted

## 2012-07-20 NOTE — Telephone Encounter (Signed)
Received message patient needs refills of his Colcrys.  I called [name] and they will refill 90 day supply x1.  Called [name] and let him know.

## 2012-07-21 ENCOUNTER — Telehealth: Payer: Self-pay | Admitting: *Deleted

## 2012-07-21 ENCOUNTER — Other Ambulatory Visit: Payer: Self-pay | Admitting: Licensed Clinical Social Worker

## 2012-07-21 DIAGNOSIS — M1990 Unspecified osteoarthritis, unspecified site: Secondary | ICD-10-CM

## 2012-07-21 MED ORDER — HYDROCODONE-ACETAMINOPHEN 5-325 MG PO TABS: 1.0000 | ORAL_TABLET | Freq: Four times a day (QID) | ORAL | Status: DC | PRN

## 2012-07-21 NOTE — Telephone Encounter (Signed)
Chad Little called and wanted the delivery address changed for his Colcrys.  I called [name] and was told Chad Little will have to call and get the address changed.  I called him back and gave him the information.  He is going to call and get the delivery sent here.

## 2012-08-20 ENCOUNTER — Other Ambulatory Visit: Payer: Self-pay | Admitting: *Deleted

## 2012-08-20 DIAGNOSIS — M1990 Unspecified osteoarthritis, unspecified site: Secondary | ICD-10-CM

## 2012-08-20 MED ORDER — HYDROCODONE-ACETAMINOPHEN 5-325 MG PO TABS: 1.0000 | ORAL_TABLET | Freq: Four times a day (QID) | ORAL | Status: DC | PRN

## 2012-09-18 ENCOUNTER — Other Ambulatory Visit: Payer: Self-pay | Admitting: *Deleted

## 2012-09-18 DIAGNOSIS — M1990 Unspecified osteoarthritis, unspecified site: Secondary | ICD-10-CM

## 2012-09-18 MED ORDER — HYDROCODONE-ACETAMINOPHEN 5-325 MG PO TABS: 1.0000 | ORAL_TABLET | Freq: Four times a day (QID) | ORAL | Status: DC | PRN

## 2012-09-22 ENCOUNTER — Other Ambulatory Visit: Payer: Self-pay | Admitting: *Deleted

## 2012-10-21 ENCOUNTER — Telehealth: Payer: Self-pay | Admitting: *Deleted

## 2012-10-21 ENCOUNTER — Other Ambulatory Visit: Payer: Self-pay | Admitting: *Deleted

## 2012-10-21 DIAGNOSIS — M1990 Unspecified osteoarthritis, unspecified site: Secondary | ICD-10-CM

## 2012-10-21 NOTE — Telephone Encounter (Signed)
Pt given phone number for P. Jones to leave message to request assistance.  Pt verbalized understanding.

## 2012-10-21 NOTE — Telephone Encounter (Signed)
Called Mr. Engen and left a v/m for him to return my call.

## 2012-10-21 NOTE — Telephone Encounter (Signed)
Patient called for refill on his Vicodin, he was unable to pick up the script in August. I called [facility] pharmacist and was told the previous script could still be filled. Patient will pick up today. Chad Little

## 2012-11-05 ENCOUNTER — Telehealth: Payer: Self-pay | Admitting: *Deleted

## 2012-11-05 NOTE — Telephone Encounter (Signed)
Faxed application to [facility] Patient Assistance today for Colcrys.

## 2012-11-11 ENCOUNTER — Telehealth: Payer: Self-pay | Admitting: *Deleted

## 2012-11-11 NOTE — Telephone Encounter (Signed)
Received fax from [facility] [place] Patient Assistance concerning documentation of $0 income.  They want a copy of IRS 4506-T form.  I called [name].  He will come by Monday and sign the form.

## 2012-11-23 ENCOUNTER — Other Ambulatory Visit: Payer: Self-pay | Admitting: *Deleted

## 2012-11-23 DIAGNOSIS — M1990 Unspecified osteoarthritis, unspecified site: Secondary | ICD-10-CM

## 2012-11-23 MED ORDER — HYDROCODONE-ACETAMINOPHEN 5-325 MG PO TABS: 1.0000 | ORAL_TABLET | Freq: Four times a day (QID) | ORAL | Status: DC | PRN

## 2012-11-25 ENCOUNTER — Ambulatory Visit: Payer: No Typology Code available for payment source

## 2012-11-26 ENCOUNTER — Telehealth: Payer: Self-pay | Admitting: *Deleted

## 2012-11-26 ENCOUNTER — Ambulatory Visit (INDEPENDENT_AMBULATORY_CARE_PROVIDER_SITE_OTHER): Payer: No Typology Code available for payment source | Admitting: *Deleted

## 2012-11-26 ENCOUNTER — Other Ambulatory Visit: Payer: Self-pay | Admitting: *Deleted

## 2012-11-26 DIAGNOSIS — B20 Human immunodeficiency virus [HIV] disease: Secondary | ICD-10-CM

## 2012-11-26 DIAGNOSIS — Z23 Encounter for immunization: Secondary | ICD-10-CM

## 2012-11-26 MED ORDER — EFAVIRENZ-EMTRICITAB-TENOFOVIR 600-200-300 MG PO TABS: 1.0000 | ORAL_TABLET | Freq: Every day | ORAL | Status: DC

## 2012-11-26 NOTE — Progress Notes (Signed)
Patient picked up his pain rx while in office for his flu shot. Andree Coss, RN

## 2012-11-26 NOTE — Telephone Encounter (Signed)
Filled out 4506-T and sent to [place] today

## 2012-12-01 ENCOUNTER — Telehealth: Payer: Self-pay | Admitting: *Deleted

## 2012-12-01 NOTE — Telephone Encounter (Signed)
Received fax from [place].  Chad Little has been approved for his Colcrys until 02-03-14.

## 2012-12-07 ENCOUNTER — Telehealth: Payer: Self-pay | Admitting: *Deleted

## 2012-12-07 NOTE — Telephone Encounter (Signed)
Called patient to advised him that his PAP medication is here and ready for pick up.   Colchicine 0.6 mg [id number] 11/04/2013 #90 Tables 3 refills  Refills [phone] RX# [id number]

## 2012-12-10 ENCOUNTER — Other Ambulatory Visit: Payer: Self-pay | Admitting: *Deleted

## 2012-12-10 DIAGNOSIS — B20 Human immunodeficiency virus [HIV] disease: Secondary | ICD-10-CM

## 2012-12-10 MED ORDER — EFAVIRENZ-EMTRICITAB-TENOFOVIR 600-200-300 MG PO TABS: 1.0000 | ORAL_TABLET | Freq: Every day | ORAL | Status: DC

## 2012-12-22 ENCOUNTER — Other Ambulatory Visit: Payer: Self-pay | Admitting: *Deleted

## 2012-12-22 DIAGNOSIS — M1990 Unspecified osteoarthritis, unspecified site: Secondary | ICD-10-CM

## 2012-12-22 MED ORDER — HYDROCODONE-ACETAMINOPHEN 5-325 MG PO TABS: 1.0000 | ORAL_TABLET | Freq: Four times a day (QID) | ORAL | Status: DC | PRN

## 2013-01-06 ENCOUNTER — Telehealth: Payer: Self-pay | Admitting: *Deleted

## 2013-01-06 ENCOUNTER — Other Ambulatory Visit: Payer: Self-pay

## 2013-01-06 NOTE — Telephone Encounter (Signed)
Called [facility].  Atripla rx went through the system.  Pt notified that he can pick up rx.

## 2013-01-20 ENCOUNTER — Ambulatory Visit (INDEPENDENT_AMBULATORY_CARE_PROVIDER_SITE_OTHER): Payer: Self-pay | Admitting: Infectious Diseases

## 2013-01-20 ENCOUNTER — Encounter: Payer: Self-pay | Admitting: Infectious Diseases

## 2013-01-20 VITALS — BP 152/100 | HR 77 | Temp 97.8°F | Ht 77.0 in | Wt 206.0 lb

## 2013-01-20 DIAGNOSIS — B20 Human immunodeficiency virus [HIV] disease: Secondary | ICD-10-CM

## 2013-01-20 DIAGNOSIS — R030 Elevated blood-pressure reading, without diagnosis of hypertension: Secondary | ICD-10-CM

## 2013-01-20 DIAGNOSIS — Z113 Encounter for screening for infections with a predominantly sexual mode of transmission: Secondary | ICD-10-CM

## 2013-01-20 DIAGNOSIS — M1990 Unspecified osteoarthritis, unspecified site: Secondary | ICD-10-CM

## 2013-01-20 LAB — CBC WITH DIFFERENTIAL/PLATELET
Basophils Absolute: 0 10*3/uL (ref 0.0–0.1)
Basophils Relative: 0 % (ref 0–1)
Eosinophils Absolute: 0.1 10*3/uL (ref 0.0–0.7)
Eosinophils Relative: 1 % (ref 0–5)
HCT: 45.5 % (ref 39.0–52.0)
MCHC: 35.6 g/dL (ref 30.0–36.0)
MCV: 90.8 fL (ref 78.0–100.0)
Monocytes Absolute: 0.5 10*3/uL (ref 0.1–1.0)
Neutro Abs: 3.5 10*3/uL (ref 1.7–7.7)
RDW: 13.4 % (ref 11.5–15.5)

## 2013-01-20 LAB — COMPREHENSIVE METABOLIC PANEL
AST: 21 U/L (ref 0–37)
Alkaline Phosphatase: 105 U/L (ref 39–117)
BUN: 10 mg/dL (ref 6–23)
Creat: 1.19 mg/dL (ref 0.50–1.35)
Total Bilirubin: 0.5 mg/dL (ref 0.3–1.2)

## 2013-01-20 MED ORDER — HYDROCODONE-ACETAMINOPHEN 5-325 MG PO TABS: 1.0000 | ORAL_TABLET | Freq: Four times a day (QID) | ORAL | Status: DC | PRN

## 2013-01-20 NOTE — Assessment & Plan Note (Signed)
He believes that this is related to his multiple stressors. Will continue to watch.

## 2013-01-20 NOTE — Addendum Note (Signed)
Addended by: Mariea Clonts D on: 01/20/2013 11:17 AM   Modules accepted: Orders

## 2013-01-20 NOTE — Progress Notes (Signed)
   Subjective:    Patient ID: Chad Little, male    DOB: [birth year], 52 y.o.   MRN: [id number]  HPI 52 yo M with hx of HIV+ since 2008. Has been taking atripla. Has been having difficulty with loss of job. No new labs. Has been otherwise been feeling well. Had a "bad cold". Cough, yellow phlegm, no fever or chills now (had last week). Now with just dry cough. Had been off meds due to lapse in ADAP. Was off 3 weeks.   Review of Systems  Constitutional: Negative for fever and chills.  Respiratory: Positive for cough. Negative for shortness of breath and wheezing.   Cardiovascular: Negative for chest pain.  Gastrointestinal: Negative for diarrhea and constipation.       No hx of anal sores or warts.   Genitourinary: Negative for difficulty urinating.  Neurological: Positive for headaches.       Objective:   Physical Exam  Constitutional: He appears well-developed and well-nourished.  HENT:  Mouth/Throat: No oropharyngeal exudate.  Eyes: EOM are normal. Pupils are equal, round, and reactive to light.  Neck: Neck supple.  Cardiovascular: Normal rate, regular rhythm and normal heart sounds.   Pulmonary/Chest: Effort normal and breath sounds normal.  Abdominal: Soft. Bowel sounds are normal. He exhibits no distension. There is no tenderness.  Lymphadenopathy:    He has no cervical adenopathy.          Assessment & Plan:

## 2013-01-20 NOTE — Assessment & Plan Note (Signed)
Appears to be doing well. i encouraged him to let us know if he is going to run out of ART so that we can help him stay on track. Is given condoms. Has gotten flu shot. Will see him back in 6 months.

## 2013-01-20 NOTE — Assessment & Plan Note (Signed)
Asks for renewal of pain rx, will do. Let him know that this is the last refill we will provide. He is going to become established with PCP.

## 2013-01-21 LAB — T-HELPER CELL (CD4) - (RCID CLINIC ONLY)
CD4 % Helper T Cell: 29 % — ABNORMAL LOW (ref 33–55)
CD4 T Cell Abs: 360 /uL — ABNORMAL LOW (ref 400–2700)

## 2013-03-16 ENCOUNTER — Telehealth: Payer: Self-pay | Admitting: *Deleted

## 2013-03-16 ENCOUNTER — Other Ambulatory Visit: Payer: Self-pay | Admitting: *Deleted

## 2013-03-16 DIAGNOSIS — M109 Gout, unspecified: Secondary | ICD-10-CM

## 2013-03-16 MED ORDER — COLCHICINE 0.6 MG PO TABS: 0.6000 mg | ORAL_TABLET | Freq: Every day | ORAL | Status: DC

## 2013-03-16 NOTE — Telephone Encounter (Signed)
Faxed new prescription to [facility] Patient Assistance today.

## 2013-03-22 ENCOUNTER — Ambulatory Visit: Payer: Self-pay

## 2013-04-09 ENCOUNTER — Ambulatory Visit: Payer: Self-pay

## 2013-04-12 ENCOUNTER — Telehealth: Payer: Self-pay | Admitting: *Deleted

## 2013-04-12 NOTE — Telephone Encounter (Signed)
Received a call from Mr. Winther.  He was wanting to know if we had his refill of Colcrys.  I returned his call.  We do not have it.  I called [employer] to ask about his refill.  He should receive within next 7-10 days.  They did not ship any last week.  Called Gerald Stabs back and told him.

## 2013-04-13 ENCOUNTER — Ambulatory Visit: Payer: Self-pay

## 2013-04-19 ENCOUNTER — Telehealth: Payer: Self-pay | Admitting: *Deleted

## 2013-04-19 NOTE — Telephone Encounter (Signed)
Colchicine 0.6 mg #90 ready for pick up. Pt transferred to [facility] for ADAP appointment. Landis Gandy, RN

## 2013-04-22 ENCOUNTER — Ambulatory Visit: Payer: Self-pay

## 2013-06-23 ENCOUNTER — Telehealth: Payer: Self-pay | Admitting: *Deleted

## 2013-06-23 ENCOUNTER — Encounter: Payer: Self-pay | Admitting: *Deleted

## 2013-06-23 DIAGNOSIS — B20 Human immunodeficiency virus [HIV] disease: Secondary | ICD-10-CM

## 2013-06-23 MED ORDER — EFAVIRENZ-EMTRICITAB-TENOFOVIR 600-200-300 MG PO TABS: 1.0000 | ORAL_TABLET | Freq: Every day | ORAL | Status: DC

## 2013-06-23 NOTE — Telephone Encounter (Signed)
ADAP application - needing paper rxes

## 2013-07-07 ENCOUNTER — Other Ambulatory Visit: Payer: Self-pay

## 2013-07-13 ENCOUNTER — Other Ambulatory Visit: Payer: Self-pay | Admitting: *Deleted

## 2013-07-13 DIAGNOSIS — B20 Human immunodeficiency virus [HIV] disease: Secondary | ICD-10-CM

## 2013-07-13 MED ORDER — EFAVIRENZ-EMTRICITAB-TENOFOVIR 600-200-300 MG PO TABS: 1.0000 | ORAL_TABLET | Freq: Every day | ORAL | Status: DC

## 2013-07-21 ENCOUNTER — Ambulatory Visit: Payer: Self-pay | Admitting: Infectious Diseases

## 2013-07-27 ENCOUNTER — Other Ambulatory Visit (INDEPENDENT_AMBULATORY_CARE_PROVIDER_SITE_OTHER): Payer: Self-pay

## 2013-07-27 DIAGNOSIS — Z113 Encounter for screening for infections with a predominantly sexual mode of transmission: Secondary | ICD-10-CM

## 2013-07-27 DIAGNOSIS — Z79899 Other long term (current) drug therapy: Secondary | ICD-10-CM

## 2013-07-27 DIAGNOSIS — B20 Human immunodeficiency virus [HIV] disease: Secondary | ICD-10-CM

## 2013-07-27 LAB — CBC WITH DIFFERENTIAL/PLATELET
BASOS ABS: 0.1 10*3/uL (ref 0.0–0.1)
BASOS PCT: 1 % (ref 0–1)
Eosinophils Absolute: 0.1 10*3/uL (ref 0.0–0.7)
Eosinophils Relative: 1 % (ref 0–5)
HCT: 45.5 % (ref 39.0–52.0)
Hemoglobin: 16.1 g/dL (ref 13.0–17.0)
LYMPHS PCT: 22 % (ref 12–46)
Lymphs Abs: 1.3 10*3/uL (ref 0.7–4.0)
MCH: 32.7 pg (ref 26.0–34.0)
MCHC: 35.4 g/dL (ref 30.0–36.0)
MCV: 92.5 fL (ref 78.0–100.0)
MONO ABS: 0.5 10*3/uL (ref 0.1–1.0)
Monocytes Relative: 8 % (ref 3–12)
NEUTROS ABS: 4.1 10*3/uL (ref 1.7–7.7)
NEUTROS PCT: 68 % (ref 43–77)
PLATELETS: 290 10*3/uL (ref 150–400)
RBC: 4.92 MIL/uL (ref 4.22–5.81)
RDW: 13.2 % (ref 11.5–15.5)
WBC: 6.1 10*3/uL (ref 4.0–10.5)

## 2013-07-28 LAB — COMPLETE METABOLIC PANEL WITH GFR
ALBUMIN: 4.6 g/dL (ref 3.5–5.2)
ALK PHOS: 106 U/L (ref 39–117)
ALT: 21 U/L (ref 0–53)
AST: 25 U/L (ref 0–37)
BUN: 15 mg/dL (ref 6–23)
CO2: 28 meq/L (ref 19–32)
Calcium: 9.9 mg/dL (ref 8.4–10.5)
Chloride: 105 mEq/L (ref 96–112)
Creat: 1.36 mg/dL — ABNORMAL HIGH (ref 0.50–1.35)
GFR, EST AFRICAN AMERICAN: 67 mL/min
GFR, EST NON AFRICAN AMERICAN: 58 mL/min — AB
GLUCOSE: 128 mg/dL — AB (ref 70–99)
Potassium: 4.5 mEq/L (ref 3.5–5.3)
Sodium: 140 mEq/L (ref 135–145)
Total Bilirubin: 0.5 mg/dL (ref 0.2–1.2)
Total Protein: 7.3 g/dL (ref 6.0–8.3)

## 2013-07-28 LAB — LIPID PANEL
CHOLESTEROL: 162 mg/dL (ref 0–200)
HDL: 80 mg/dL (ref >39)
LDL Cholesterol: 71 mg/dL (ref 0–99)
Total CHOL/HDL Ratio: 2 Ratio
Triglycerides: 54 mg/dL (ref <150)
VLDL: 11 mg/dL (ref 0–40)

## 2013-07-28 LAB — T-HELPER CELL (CD4) - (RCID CLINIC ONLY)
CD4 T CELL HELPER: 35 % (ref 33–55)
CD4 T Cell Abs: 450 /uL (ref 400–2700)

## 2013-07-28 LAB — RPR

## 2013-07-30 LAB — HIV-1 RNA QUANT-NO REFLEX-BLD

## 2013-08-27 ENCOUNTER — Ambulatory Visit: Payer: Self-pay | Admitting: Infectious Diseases

## 2013-08-30 ENCOUNTER — Ambulatory Visit: Payer: Self-pay | Admitting: Infectious Diseases

## 2013-09-27 ENCOUNTER — Ambulatory Visit: Payer: Self-pay | Admitting: Infectious Diseases

## 2013-10-20 ENCOUNTER — Ambulatory Visit: Payer: Self-pay

## 2013-10-21 ENCOUNTER — Ambulatory Visit: Payer: Self-pay

## 2013-10-28 IMAGING — CR DG HAND COMPLETE 3+V*R*
3 series · 3 of 3 positions shown · non-contrast
Comparison: 07/15/2007

CLINICAL DATA: Pain/swelling of the right fifth digit

RIGHT HAND - COMPLETE 3+ VIEW

[x hand pa right]
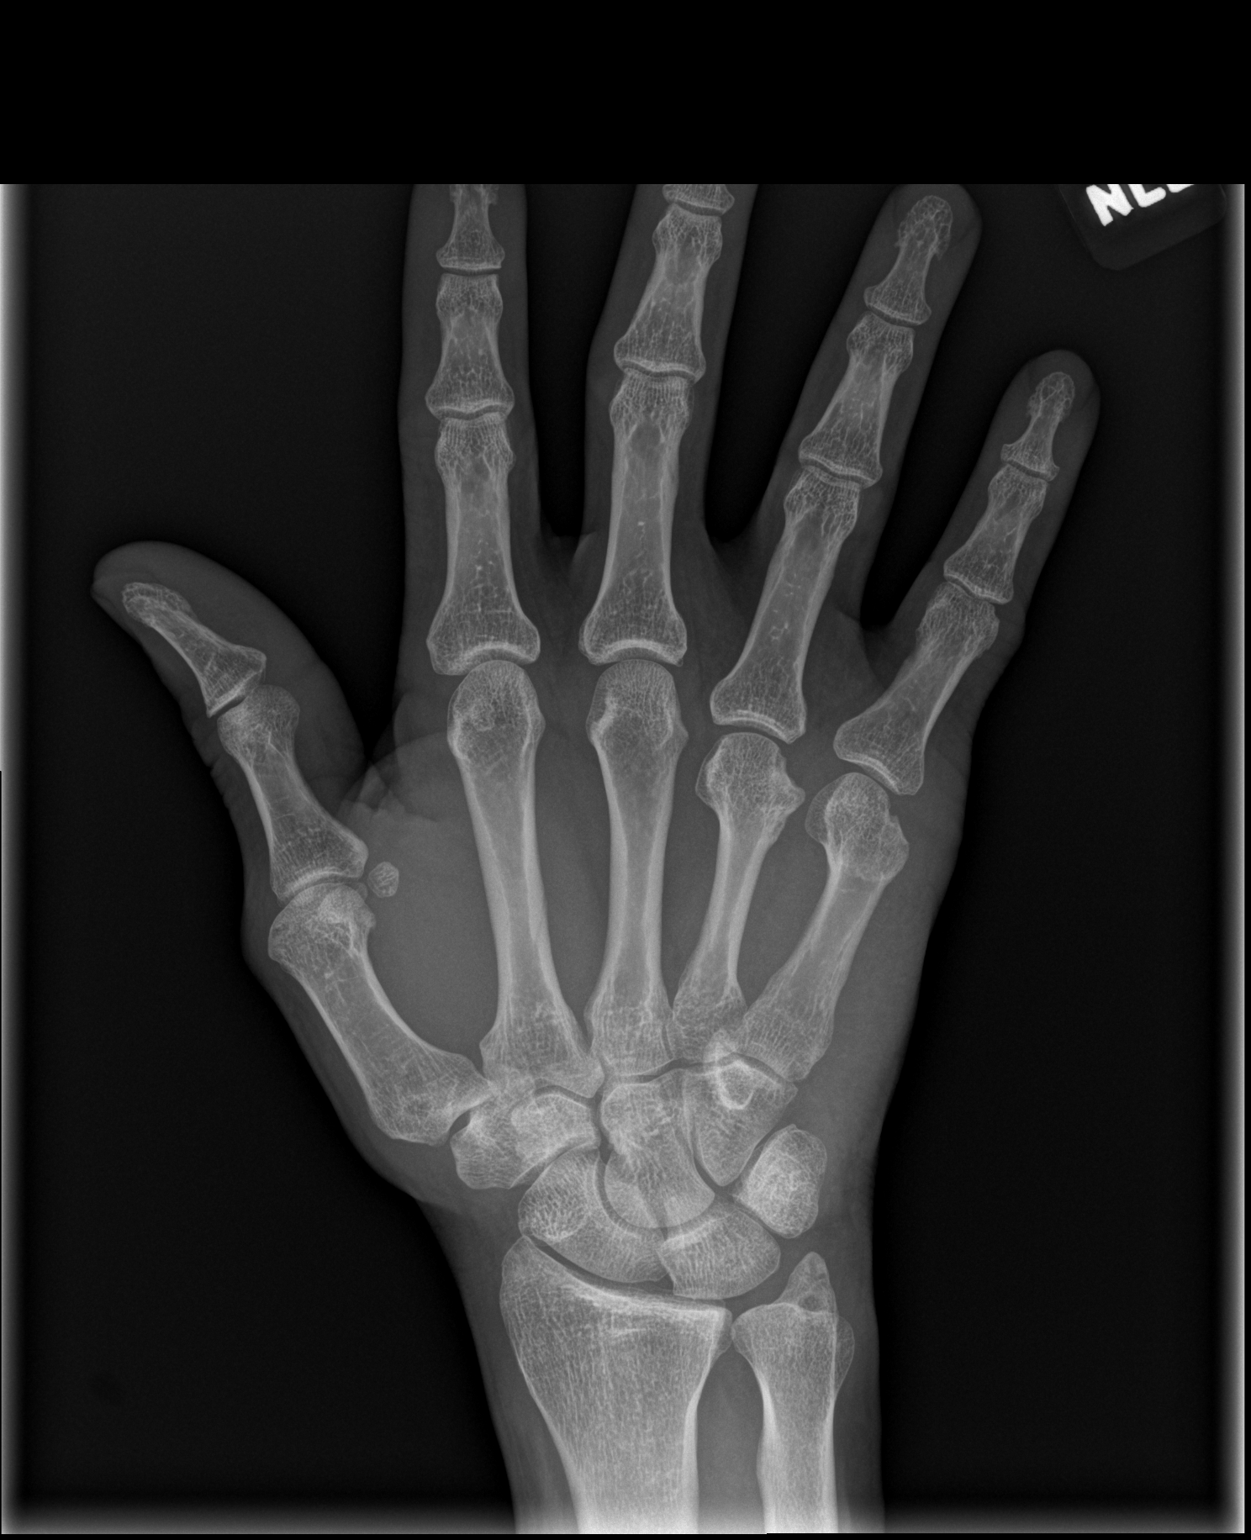

[x hand obl right]
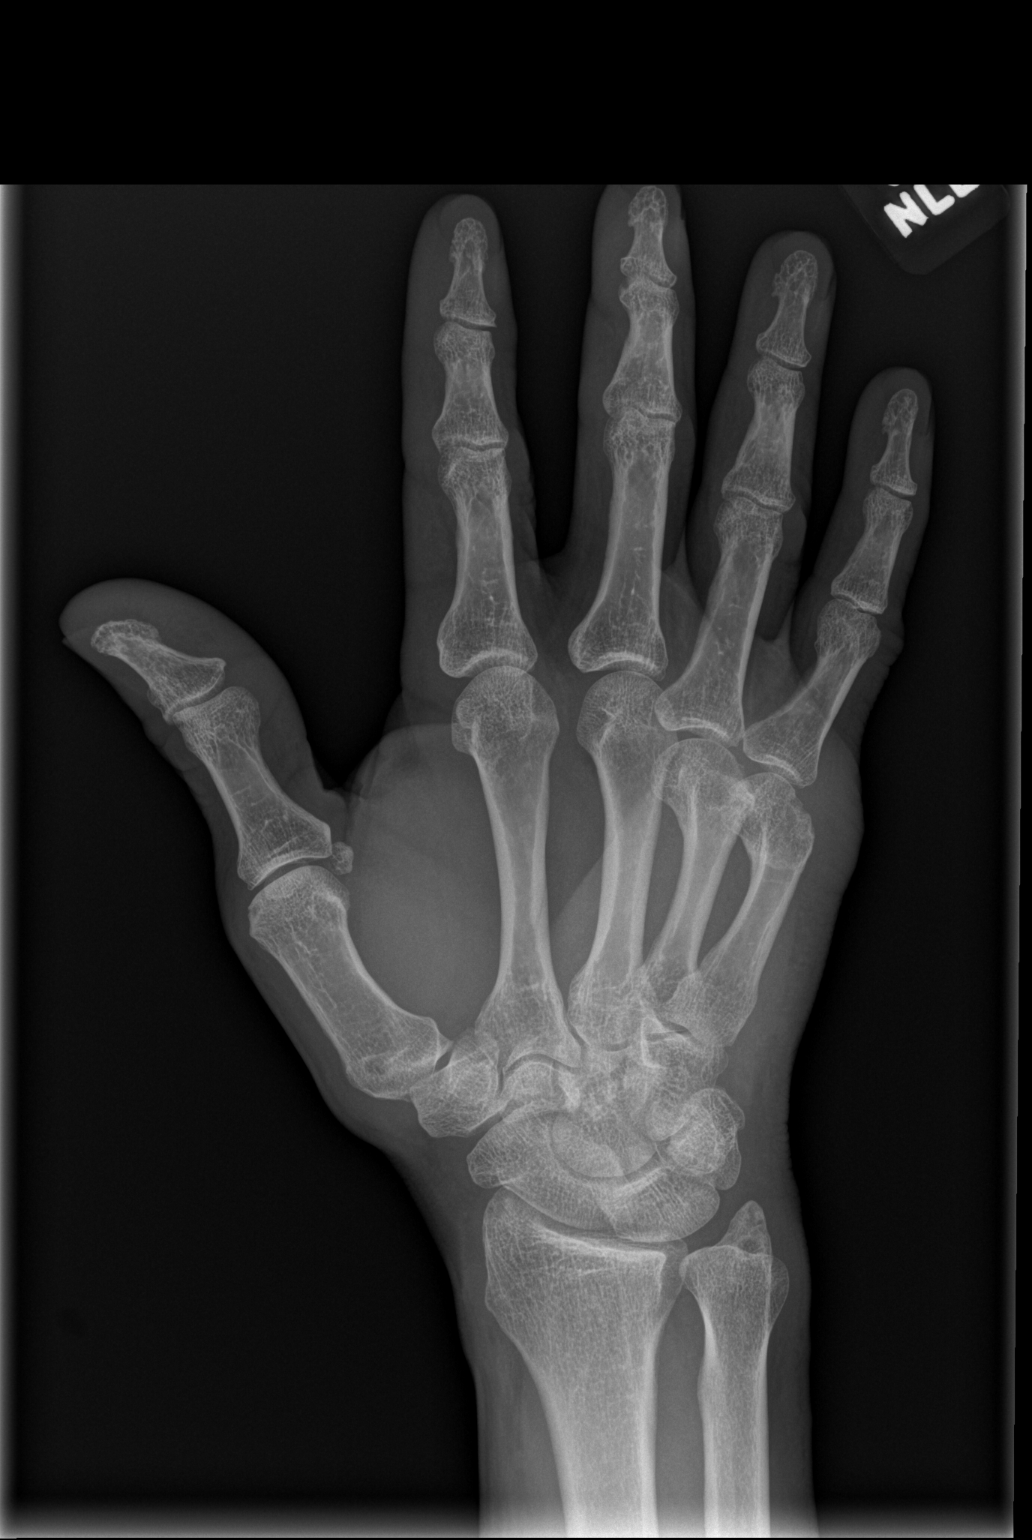

[x hand lat right]
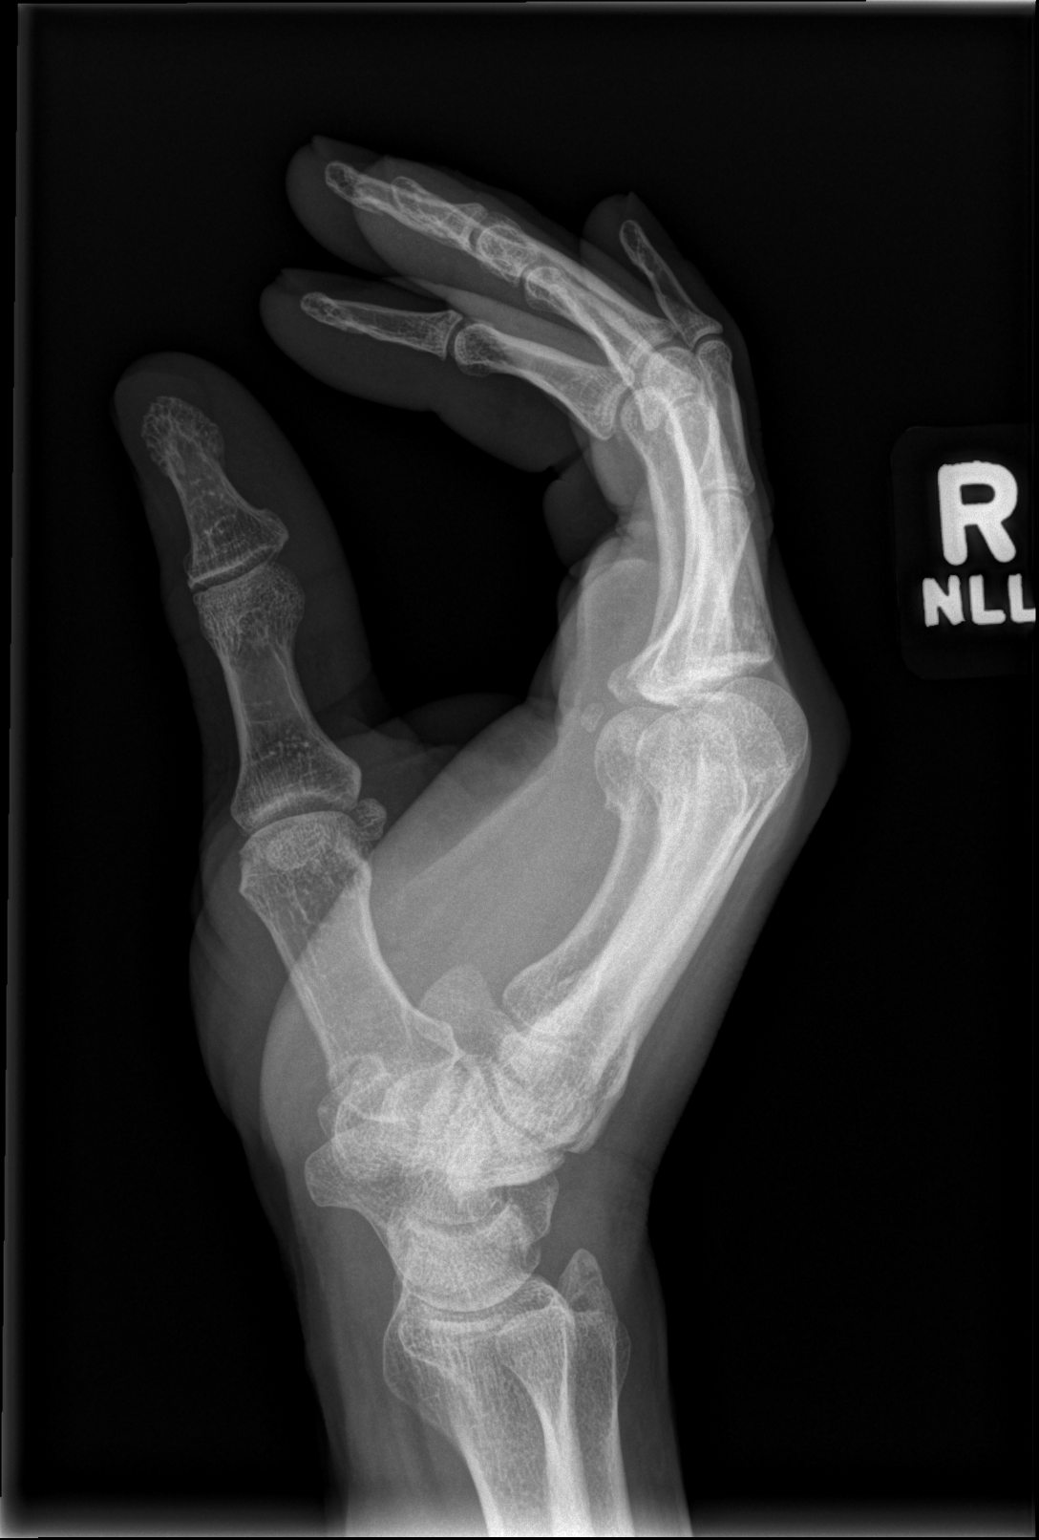

[3 of 3 positions shown; findings below may reference images not displayed]

FINDINGS: No evidence of acute fracture or dislocation.

Deformity related to prior distal fourth and fifth metacarpal
fractures.

The joint spaces are preserved.

The visualized soft tissues are grossly unremarkable.
IMPRESSION: No evidence of acute fracture or dislocation.

Deformity related to prior distal fourth and fifth metacarpal
fractures.

## 2013-11-04 ENCOUNTER — Ambulatory Visit: Payer: Self-pay

## 2013-11-04 ENCOUNTER — Ambulatory Visit: Payer: Self-pay | Admitting: Infectious Diseases

## 2013-11-04 ENCOUNTER — Other Ambulatory Visit: Payer: Self-pay | Admitting: Licensed Clinical Social Worker

## 2013-11-04 DIAGNOSIS — B20 Human immunodeficiency virus [HIV] disease: Secondary | ICD-10-CM

## 2013-11-04 MED ORDER — EFAVIRENZ-EMTRICITAB-TENOFOVIR 600-200-300 MG PO TABS: 1.0000 | ORAL_TABLET | Freq: Every day | ORAL | Status: DC

## 2013-11-05 ENCOUNTER — Ambulatory Visit: Payer: Self-pay | Admitting: Infectious Diseases

## 2014-02-23 ENCOUNTER — Other Ambulatory Visit: Payer: Self-pay

## 2014-03-09 ENCOUNTER — Ambulatory Visit: Payer: Self-pay

## 2014-03-09 ENCOUNTER — Ambulatory Visit: Payer: Self-pay | Admitting: Infectious Diseases

## 2014-03-09 ENCOUNTER — Ambulatory Visit (INDEPENDENT_AMBULATORY_CARE_PROVIDER_SITE_OTHER): Payer: Self-pay

## 2014-03-09 ENCOUNTER — Other Ambulatory Visit: Payer: Self-pay | Admitting: *Deleted

## 2014-03-09 ENCOUNTER — Other Ambulatory Visit (INDEPENDENT_AMBULATORY_CARE_PROVIDER_SITE_OTHER): Payer: Self-pay

## 2014-03-09 DIAGNOSIS — Z23 Encounter for immunization: Secondary | ICD-10-CM

## 2014-03-09 DIAGNOSIS — B20 Human immunodeficiency virus [HIV] disease: Secondary | ICD-10-CM

## 2014-03-09 LAB — COMPLETE METABOLIC PANEL WITH GFR
ALBUMIN: 4 g/dL (ref 3.5–5.2)
ALT: 28 U/L (ref 0–53)
AST: 31 U/L (ref 0–37)
Alkaline Phosphatase: 104 U/L (ref 39–117)
BUN: 12 mg/dL (ref 6–23)
CALCIUM: 9.7 mg/dL (ref 8.4–10.5)
CO2: 26 mEq/L (ref 19–32)
CREATININE: 1.37 mg/dL — AB (ref 0.50–1.35)
Chloride: 102 mEq/L (ref 96–112)
GFR, EST NON AFRICAN AMERICAN: 58 mL/min — AB
GFR, Est African American: 67 mL/min
GLUCOSE: 84 mg/dL (ref 70–99)
Potassium: 5.4 mEq/L — ABNORMAL HIGH (ref 3.5–5.3)
SODIUM: 140 meq/L (ref 135–145)
TOTAL PROTEIN: 7.5 g/dL (ref 6.0–8.3)
Total Bilirubin: 0.5 mg/dL (ref 0.2–1.2)

## 2014-03-09 LAB — CBC WITH DIFFERENTIAL/PLATELET
BASOS ABS: 0.1 10*3/uL (ref 0.0–0.1)
Basophils Relative: 1 % (ref 0–1)
EOS ABS: 0.1 10*3/uL (ref 0.0–0.7)
Eosinophils Relative: 2 % (ref 0–5)
HCT: 50.6 % (ref 39.0–52.0)
Hemoglobin: 17.7 g/dL — ABNORMAL HIGH (ref 13.0–17.0)
LYMPHS ABS: 1.7 10*3/uL (ref 0.7–4.0)
Lymphocytes Relative: 32 % (ref 12–46)
MCH: 32.5 pg (ref 26.0–34.0)
MCHC: 35 g/dL (ref 30.0–36.0)
MCV: 93 fL (ref 78.0–100.0)
MONO ABS: 0.6 10*3/uL (ref 0.1–1.0)
MPV: 8.9 fL (ref 8.6–12.4)
Monocytes Relative: 11 % (ref 3–12)
NEUTROS ABS: 2.9 10*3/uL (ref 1.7–7.7)
Neutrophils Relative %: 54 % (ref 43–77)
PLATELETS: 299 10*3/uL (ref 150–400)
RBC: 5.44 MIL/uL (ref 4.22–5.81)
RDW: 13.5 % (ref 11.5–15.5)
WBC: 5.4 10*3/uL (ref 4.0–10.5)

## 2014-03-09 MED ORDER — EFAVIRENZ-EMTRICITAB-TENOFOVIR 600-200-300 MG PO TABS: 1.0000 | ORAL_TABLET | Freq: Every day | ORAL | Status: DC

## 2014-03-10 LAB — HIV-1 RNA QUANT-NO REFLEX-BLD
HIV 1 RNA Quant: <20 copies/mL (ref <20)
HIV-1 RNA Quant, Log: <1.3 {Log} (ref <1.30)

## 2014-03-10 LAB — T-HELPER CELL (CD4) - (RCID CLINIC ONLY)
CD4 % Helper T Cell: 31 % — ABNORMAL LOW (ref 33–55)
CD4 T CELL ABS: 560 /uL (ref 400–2700)

## 2014-03-23 ENCOUNTER — Ambulatory Visit: Payer: Self-pay | Admitting: Infectious Diseases

## 2014-03-25 ENCOUNTER — Ambulatory Visit: Payer: Self-pay | Admitting: Infectious Diseases

## 2014-04-11 ENCOUNTER — Ambulatory Visit: Payer: Self-pay | Admitting: Infectious Diseases

## 2014-06-19 ENCOUNTER — Other Ambulatory Visit: Payer: Self-pay | Admitting: Infectious Diseases

## 2014-09-12 ENCOUNTER — Ambulatory Visit: Payer: Self-pay

## 2014-09-15 ENCOUNTER — Other Ambulatory Visit: Payer: Self-pay | Admitting: *Deleted

## 2014-09-15 ENCOUNTER — Telehealth: Payer: Self-pay | Admitting: *Deleted

## 2014-09-15 ENCOUNTER — Other Ambulatory Visit: Payer: Self-pay

## 2014-09-15 ENCOUNTER — Ambulatory Visit: Payer: Self-pay

## 2014-09-15 DIAGNOSIS — B20 Human immunodeficiency virus [HIV] disease: Secondary | ICD-10-CM

## 2014-09-15 DIAGNOSIS — Z79899 Other long term (current) drug therapy: Secondary | ICD-10-CM

## 2014-09-15 DIAGNOSIS — Z113 Encounter for screening for infections with a predominantly sexual mode of transmission: Secondary | ICD-10-CM

## 2014-09-15 LAB — CBC WITH DIFFERENTIAL/PLATELET
BASOS PCT: 1 % (ref 0–1)
Basophils Absolute: 0 10*3/uL (ref 0.0–0.1)
EOS ABS: 0 10*3/uL (ref 0.0–0.7)
EOS PCT: 1 % (ref 0–5)
HCT: 51.6 % (ref 39.0–52.0)
Hemoglobin: 17.7 g/dL — ABNORMAL HIGH (ref 13.0–17.0)
LYMPHS ABS: 0.8 10*3/uL (ref 0.7–4.0)
Lymphocytes Relative: 19 % (ref 12–46)
MCH: 32.2 pg (ref 26.0–34.0)
MCHC: 34.3 g/dL (ref 30.0–36.0)
MCV: 94 fL (ref 78.0–100.0)
MONOS PCT: 16 % — AB (ref 3–12)
MPV: 10.2 fL (ref 8.6–12.4)
Monocytes Absolute: 0.7 10*3/uL (ref 0.1–1.0)
Neutro Abs: 2.7 10*3/uL (ref 1.7–7.7)
Neutrophils Relative %: 63 % (ref 43–77)
Platelets: 200 10*3/uL (ref 150–400)
RBC: 5.49 MIL/uL (ref 4.22–5.81)
RDW: 13 % (ref 11.5–15.5)
WBC: 4.3 10*3/uL (ref 4.0–10.5)

## 2014-09-15 LAB — LIPID PANEL
CHOL/HDL RATIO: 2 ratio (ref <=5.0)
Cholesterol: 153 mg/dL (ref 125–200)
HDL: 78 mg/dL (ref >=40)
LDL CALC: 67 mg/dL (ref <130)
Triglycerides: 42 mg/dL (ref <150)
VLDL: 8 mg/dL (ref <30)

## 2014-09-15 LAB — COMPLETE METABOLIC PANEL WITH GFR
ALBUMIN: 4.2 g/dL (ref 3.6–5.1)
ALK PHOS: 91 U/L (ref 40–115)
ALT: 16 U/L (ref 9–46)
AST: 24 U/L (ref 10–35)
BUN: 14 mg/dL (ref 7–25)
CO2: 23 mmol/L (ref 20–31)
Calcium: 9 mg/dL (ref 8.6–10.3)
Chloride: 107 mmol/L (ref 98–110)
Creat: 1.36 mg/dL — ABNORMAL HIGH (ref 0.70–1.33)
GFR, EST NON AFRICAN AMERICAN: 58 mL/min — AB (ref >=60)
GFR, Est African American: 67 mL/min (ref >=60)
GLUCOSE: 89 mg/dL (ref 65–99)
POTASSIUM: 4.7 mmol/L (ref 3.5–5.3)
SODIUM: 141 mmol/L (ref 135–146)
Total Bilirubin: 0.7 mg/dL (ref 0.2–1.2)
Total Protein: 7.4 g/dL (ref 6.1–8.1)

## 2014-09-15 MED ORDER — EFAVIRENZ-EMTRICITAB-TENOFOVIR 600-200-300 MG PO TABS: 1.0000 | ORAL_TABLET | Freq: Every day | ORAL | Status: DC

## 2014-09-15 NOTE — Telephone Encounter (Signed)
Patient returning to care, labs drawn today and atripla refilled.  He is scheduled with first available - Dr. Linus Salmons - September 15.  Patient is having issues with his knee pain and gout flares.  He states that his previous medication wasn't working well, thinks he was only taking colchicine.  He has been off of colchicine for a while.   Will refer patient to [facility] for primary care, first available is at the end of August (2+ weeks).  Please advise in the mean time if he can resume the colchicine in the mean time, or a different therapy would be more appropriate. Landis Gandy, RN

## 2014-09-16 ENCOUNTER — Other Ambulatory Visit: Payer: Self-pay | Admitting: *Deleted

## 2014-09-16 DIAGNOSIS — M109 Gout, unspecified: Secondary | ICD-10-CM

## 2014-09-16 LAB — T-HELPER CELL (CD4) - (RCID CLINIC ONLY)
CD4 T CELL ABS: 370 /uL — AB (ref 400–2700)
CD4 T CELL HELPER: 36 % (ref 33–55)

## 2014-09-16 LAB — URINE CYTOLOGY ANCILLARY ONLY
Chlamydia: NEGATIVE
Neisseria Gonorrhea: NEGATIVE

## 2014-09-16 MED ORDER — COLCHICINE 0.6 MG PO TABS: ORAL_TABLET | ORAL | Status: DC

## 2014-09-16 NOTE — Telephone Encounter (Signed)
Relayed information to patient.  He verbalized understanding.  Prescription sent to [facility]. He will discuss patient assistance with Pam.

## 2014-09-16 NOTE — Telephone Encounter (Signed)
Can take colchicine, 1 tab bid for treatment for 5 days with gout flare.  It is not typically used for prevention.  Can take NSAIDS as well.

## 2014-09-17 LAB — RPR

## 2014-09-21 ENCOUNTER — Ambulatory Visit: Payer: Self-pay

## 2014-09-21 DIAGNOSIS — F4310 Post-traumatic stress disorder, unspecified: Secondary | ICD-10-CM
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
## 2014-09-26 LAB — HIV-1 GENOTYPR PLUS

## 2014-09-29 ENCOUNTER — Ambulatory Visit: Payer: Self-pay

## 2014-10-06 ENCOUNTER — Telehealth: Payer: Self-pay | Admitting: *Deleted

## 2014-10-06 NOTE — Telephone Encounter (Signed)
Patient called to find out about referral to [facility] for PCP. Called for him and got an appointment with Sharon Seller, NP on 10/13/14 at 11:00 AM. He said that would work because he has an appt here with [name] at 9:00 AM. Myrtis Hopping

## 2014-10-13 ENCOUNTER — Ambulatory Visit: Payer: Self-pay | Admitting: Family Medicine

## 2014-10-13 ENCOUNTER — Ambulatory Visit: Payer: Self-pay

## 2014-10-20 ENCOUNTER — Encounter: Payer: Self-pay | Admitting: Internal Medicine

## 2014-10-20 ENCOUNTER — Ambulatory Visit (INDEPENDENT_AMBULATORY_CARE_PROVIDER_SITE_OTHER): Payer: Self-pay | Admitting: Internal Medicine

## 2014-10-20 VITALS — BP 128/91 | HR 76 | Temp 98.0°F | Ht 77.0 in | Wt 205.0 lb

## 2014-10-20 DIAGNOSIS — Z23 Encounter for immunization: Secondary | ICD-10-CM

## 2014-10-20 DIAGNOSIS — R197 Diarrhea, unspecified: Secondary | ICD-10-CM

## 2014-10-20 DIAGNOSIS — M1000 Idiopathic gout, unspecified site: Secondary | ICD-10-CM

## 2014-10-20 DIAGNOSIS — B20 Human immunodeficiency virus [HIV] disease: Secondary | ICD-10-CM

## 2014-10-20 DIAGNOSIS — B351 Tinea unguium: Secondary | ICD-10-CM

## 2014-10-20 LAB — URIC ACID: URIC ACID, SERUM: 5 mg/dL (ref 4.0–7.8)

## 2014-10-20 MED ORDER — ALLOPURINOL 300 MG PO TABS: 300.0000 mg | ORAL_TABLET | Freq: Every day | ORAL | Status: DC

## 2014-10-20 MED ORDER — EFAVIRENZ-EMTRICITAB-TENOFOVIR 600-200-300 MG PO TABS: 1.0000 | ORAL_TABLET | Freq: Every day | ORAL | Status: DC

## 2014-10-20 NOTE — Assessment & Plan Note (Addendum)
Will recheck today now about 1 month back on Atripla.  Hopefully no resistance.  If ok, rtc 2-3 months to Dr. Johnnye Sima. To establish with a PCP for pain issues.

## 2014-10-20 NOTE — Assessment & Plan Note (Signed)
Will try allopurinol daily to prevent outbreaks.

## 2014-10-20 NOTE — Assessment & Plan Note (Signed)
Will try topical therapy now and possibly oral therapy in the future if it persists.

## 2014-10-20 NOTE — Progress Notes (Signed)
   Subjective:    Patient ID: Chad Little, male    DOB: [birth year], 54 y.o.   MRN: [id number]  HPI Here to reestablish care for HIV. Was last seen in 2014 and has been on Atripla.  Has not been back and ran out of medications and returned one month ago.  Came in to clinic and had labs and restarted Atripla.  Has now been taking it daily for about 1 month.  Feels he is ready to establish care.  Lost his brother this year and has been dealing with that.  Also has had several gout outbreaks.  Notes some toenail fungus.  No weight loss, no diarrhea.     Review of Systems  Constitutional: Negative for fatigue and unexpected weight change.  HENT: Negative for trouble swallowing.   Respiratory: Negative for shortness of breath.   Gastrointestinal: Negative for nausea and diarrhea.  Skin: Negative for rash.  Neurological: Negative for dizziness and light-headedness.       Objective:   Physical Exam  Constitutional: He appears well-developed and well-nourished. No distress.  HENT:  Mouth/Throat: No oropharyngeal exudate.  Eyes: No scleral icterus.  Cardiovascular: Normal rate, regular rhythm and normal heart sounds.   No murmur heard. Pulmonary/Chest: Effort normal and breath sounds normal. No respiratory distress.  Musculoskeletal: He exhibits no edema.  Lymphadenopathy:    He has no cervical adenopathy.  Skin: No rash noted.   Social History   Social History  . Marital Status: Single    Spouse Name: N/A  . Number of Children: N/A  . Years of Education: N/A   Occupational History  . Not on file.   Social History Main Topics  . Smoking status: Never Smoker   . Smokeless tobacco: Never Used  . Alcohol Use: No     Comment: "very seldom"  . Drug Use: No  . Sexual Activity: Not Currently     Comment: refused  condoms   Other Topics Concern  . Not on file   Social History Narrative   Family History  Problem Relation Age of Onset  . Alcohol abuse Mother   .  Hypertension Father   . Stroke Father          Assessment & Plan:

## 2014-10-21 NOTE — Progress Notes (Signed)
Notified [facility] via fax.  Money Mckeithan M, RN  

## 2014-10-24 ENCOUNTER — Ambulatory Visit: Payer: Self-pay

## 2014-10-24 LAB — HIV-1 RNA ULTRAQUANT REFLEX TO GENTYP+
HIV 1 RNA QUANT: 847 {copies}/mL — AB (ref <20)
HIV-1 RNA QUANT, LOG: 2.93 {Log} — AB (ref <1.30)

## 2014-10-25 ENCOUNTER — Telehealth: Payer: Self-pay | Admitting: *Deleted

## 2014-10-25 NOTE — Telephone Encounter (Signed)
-----   Message from Thayer Headings, MD sent at 10/25/2014  1:59 PM EDT ----- He needs an appt with the next available time with any provider.  He has detectable virus, too low to do a genotype.  He should stop Atripla too, unless you find out he just restarted it, rather than a month ago like he said.  Thanks

## 2014-10-25 NOTE — Telephone Encounter (Signed)
Patient notified of appt with Dr. Tommy Medal for 11/02/14 at 3:30 pm. Myrtis Hopping

## 2014-10-28 ENCOUNTER — Other Ambulatory Visit: Payer: Self-pay | Admitting: *Deleted

## 2014-10-28 MED ORDER — INDOMETHACIN 50 MG PO CAPS: 50.0000 mg | ORAL_CAPSULE | Freq: Three times a day (TID) | ORAL | Status: DC | PRN

## 2014-10-31 ENCOUNTER — Encounter: Payer: Self-pay | Admitting: Family Medicine

## 2014-10-31 ENCOUNTER — Ambulatory Visit (INDEPENDENT_AMBULATORY_CARE_PROVIDER_SITE_OTHER): Payer: Self-pay | Admitting: Family Medicine

## 2014-10-31 VITALS — BP 138/98 | HR 70 | Temp 97.8°F | Resp 16 | Ht 77.0 in | Wt 208.0 lb

## 2014-10-31 DIAGNOSIS — R748 Abnormal levels of other serum enzymes: Secondary | ICD-10-CM

## 2014-10-31 DIAGNOSIS — I10 Essential (primary) hypertension: Secondary | ICD-10-CM | POA: Insufficient documentation

## 2014-10-31 DIAGNOSIS — R7989 Other specified abnormal findings of blood chemistry: Secondary | ICD-10-CM

## 2014-10-31 DIAGNOSIS — M1009 Idiopathic gout, multiple sites: Secondary | ICD-10-CM

## 2014-10-31 DIAGNOSIS — M109 Gout, unspecified: Secondary | ICD-10-CM | POA: Insufficient documentation

## 2014-10-31 DIAGNOSIS — R030 Elevated blood-pressure reading, without diagnosis of hypertension: Secondary | ICD-10-CM

## 2014-10-31 DIAGNOSIS — M159 Polyosteoarthritis, unspecified: Secondary | ICD-10-CM

## 2014-10-31 DIAGNOSIS — M150 Primary generalized (osteo)arthritis: Secondary | ICD-10-CM

## 2014-10-31 DIAGNOSIS — B20 Human immunodeficiency virus [HIV] disease: Secondary | ICD-10-CM

## 2014-10-31 LAB — POCT URINALYSIS DIP (DEVICE)
Bilirubin Urine: NEGATIVE
Glucose, UA: NEGATIVE mg/dL
Ketones, ur: NEGATIVE mg/dL
Leukocytes, UA: NEGATIVE
NITRITE: NEGATIVE
PH: 7 (ref 5.0–8.0)
Protein, ur: NEGATIVE mg/dL
SPECIFIC GRAVITY, URINE: 1.025 (ref 1.005–1.030)
UROBILINOGEN UA: 1 mg/dL (ref 0.0–1.0)

## 2014-10-31 LAB — BASIC METABOLIC PANEL
BUN: 14 mg/dL (ref 7–25)
CHLORIDE: 105 mmol/L (ref 98–110)
CO2: 26 mmol/L (ref 20–31)
CREATININE: 1.01 mg/dL (ref 0.70–1.33)
Calcium: 9.4 mg/dL (ref 8.6–10.3)
Glucose, Bld: 87 mg/dL (ref 65–99)
POTASSIUM: 4.4 mmol/L (ref 3.5–5.3)
Sodium: 140 mmol/L (ref 135–146)

## 2014-10-31 MED ORDER — KETOROLAC TROMETHAMINE 60 MG/2ML IM SOLN
60.0000 mg | Freq: Once | INTRAMUSCULAR | Status: AC
  Administered 2014-10-31: 60 mg via INTRAMUSCULAR

## 2014-10-31 NOTE — Progress Notes (Signed)
Subjective:    Patient ID: Chad Little, male    DOB: January 12, 1961, 54 y.o.   MRN: [id number]  HPI  Chad Little, a 54 year old with a history of HIV, gout, and osteoarthritis presents to establish care. Patient reports that he is followed by Dr. Johnnye Sima at [facility] for HIV. He maintains that he has been managing HIV since 1999. He recently was without medications for several months while taking care of a sick relative. Patient was re-started on HIV medications.   He is complaining of pain to multiple joints. He had also been without gout medications for several months.He was given prescriptions for allopurinol and Indomethacin on 10/20/2014, but did not pick up medications due to financial constraints.  Attacks occur primarily in the lower extremites. Patient reports his chronic pain is 7/10, his joint stiffness is daily and his joint swelling occurs periodically.   Condition is exacerbated by increased activity. The patient is avoiding high purine foods and reports that he has not been drinking alcoholic beverages. He has not attempted any OTC interventions to alleviate symptoms.   Past Medical History  Diagnosis Date  . HIV infection   . Gout    Immunization History  Administered Date(s) Administered  . Hepatitis B 05/18/2007, 04/04/2008, 05/13/2011  . Influenza Split 11/21/2010, 12/11/2011  . Influenza Whole 12/07/2007, 11/28/2008, 12/20/2009  . Influenza,inj,Quad PF,36+ Mos 11/26/2012, 03/09/2014, 10/20/2014  . PPD Test 02/12/2010  . Pneumococcal Polysaccharide-23 05/18/2007, 07/06/2012  No Known Allergies    Medication List       This list is accurate as of: 10/31/54  6:21 PM.  Always use your most recent med list.               acetaminophen 500 MG tablet  Commonly known as:  TYLENOL  Take 500 mg by mouth every 6 (six) hours as needed. For pain     allopurinol 300 MG tablet  Commonly known as:  ZYLOPRIM  Take 1 tablet (300 mg total) by mouth daily.     colchicine 0.6 MG tablet  1 tab twice daily for 5 days for treatment of gout flare     efavirenz-emtricitabine-tenofovir 600-200-300 MG per tablet  Commonly known as:  ATRIPLA  Take 1 tablet by mouth at bedtime.     indomethacin 50 MG capsule  Commonly known as:  INDOCIN  Take 1 capsule (50 mg total) by mouth 3 (three) times daily as needed.     multivitamin with minerals Tabs tablet  Take 1 tablet by mouth daily.       Review of Systems  Constitutional: Negative for fatigue.  Eyes: Negative for photophobia and visual disturbance.  Respiratory: Negative.   Cardiovascular: Negative.  Negative for chest pain, palpitations and leg swelling.  Gastrointestinal: Negative for nausea and diarrhea.  Endocrine: Negative for polydipsia, polyphagia and polyuria.  Genitourinary: Negative.   Musculoskeletal: Positive for arthralgias.  Allergic/Immunologic: Positive for immunocompromised state.  Neurological: Negative for dizziness and numbness.  Hematological: Negative.   Psychiatric/Behavioral: Positive for sleep disturbance. Negative for suicidal ideas.       Objective:   Physical Exam  Constitutional: He is oriented to person, place, and time. He appears well-developed and well-nourished.  HENT:  Head: Normocephalic and atraumatic.  Right Ear: External ear normal.  Left Ear: External ear normal.  Mouth/Throat: Oropharynx is clear and moist.  Eyes: Conjunctivae and EOM are normal. Pupils are equal, round, and reactive to light.  Neck: Normal range of motion. Neck supple.  Cardiovascular: Normal rate, regular rhythm, normal heart sounds and intact distal pulses.   Pulmonary/Chest: Effort normal and breath sounds normal.  Abdominal: Soft. Bowel sounds are normal.  Musculoskeletal:       Right shoulder: He exhibits decreased range of motion, tenderness and pain. He exhibits no swelling.       Left shoulder: He exhibits decreased range of motion and tenderness.       Right knee: He  exhibits decreased range of motion. He exhibits no swelling. Tenderness found.       Left knee: He exhibits decreased range of motion. He exhibits no swelling. Tenderness found.  Neurological: He is alert and oriented to person, place, and time. He has normal reflexes.  Skin: Skin is warm and dry.  Psychiatric: He has a normal mood and affect. His behavior is normal. Judgment and thought content normal.         BP 139/85 mmHg  Pulse 70  Temp(Src) 97.8 F (36.6 C) (Oral)  Resp 16  Ht 6\' 5"  (1.956 m)  Wt 208 lb (94.348 kg)  BMI 24.66 kg/m2 Assessment & Plan:  1. Gouty arthritis Advised patient to pick up prescribed medications from pharmacy. Patient received a Toradol injection without complication.   - ketorolac (TORADOL) injection 60 mg; Inject 2 mLs (60 mg total) into the muscle once.  2. Elevated serum creatinine Reviewed labs from 09/20/2014, patient's serum creatinine is elevated to 1.36, will recheck BMP and review urinalysis for proteinuria.  - Basic Metabolic Panel - POCT urinalysis dipstick  3. HIV disease Patient is followed by Dr. Linus Salmons at [facility]. Reviewed labs, patient has detectable virus. Patient was advised by provider to discontinue Atripla. He has a follow up appointment scheduled with [facility].   4. Primary osteoarthritis involving multiple joints Advised patient to start Indomethacin as previously prescribed. Will re-evaluate in 1 month.   5. Prehypertension The patient is asked to make an attempt to improve diet and exercise patterns to aid in medical management of this problem.   Hollis,Lachina M, FNP  RTC: 1 month for evaluation of gout and osteoarthritis  Health Maintenance:  Will return for a complete physical exam w/ prostate examination Reviewed recent cholesterol results Never had colonoscopy   The patient was given clear instructions to go to ER or return to medical center if symptoms do not improve, worsen or new problems develop. The patient  verbalized understanding. Will notify patient with laboratory results.

## 2014-11-02 ENCOUNTER — Ambulatory Visit (INDEPENDENT_AMBULATORY_CARE_PROVIDER_SITE_OTHER): Payer: Self-pay | Admitting: Infectious Disease

## 2014-11-02 ENCOUNTER — Encounter: Payer: Self-pay | Admitting: Infectious Disease

## 2014-11-02 VITALS — BP 133/84 | HR 70 | Temp 97.8°F | Ht 77.0 in | Wt 207.0 lb

## 2014-11-02 DIAGNOSIS — M1000 Idiopathic gout, unspecified site: Secondary | ICD-10-CM

## 2014-11-02 DIAGNOSIS — B20 Human immunodeficiency virus [HIV] disease: Secondary | ICD-10-CM

## 2014-11-02 LAB — CBC WITH DIFFERENTIAL/PLATELET
BASOS PCT: 0 % (ref 0–1)
Basophils Absolute: 0 10*3/uL (ref 0.0–0.1)
EOS ABS: 0.1 10*3/uL (ref 0.0–0.7)
Eosinophils Relative: 2 % (ref 0–5)
HCT: 47.3 % (ref 39.0–52.0)
HEMOGLOBIN: 16.4 g/dL (ref 13.0–17.0)
Lymphocytes Relative: 26 % (ref 12–46)
Lymphs Abs: 1.6 10*3/uL (ref 0.7–4.0)
MCH: 32.3 pg (ref 26.0–34.0)
MCHC: 34.7 g/dL (ref 30.0–36.0)
MCV: 93.3 fL (ref 78.0–100.0)
MPV: 9.4 fL (ref 8.6–12.4)
Monocytes Absolute: 0.6 10*3/uL (ref 0.1–1.0)
Monocytes Relative: 9 % (ref 3–12)
NEUTROS ABS: 4 10*3/uL (ref 1.7–7.7)
NEUTROS PCT: 63 % (ref 43–77)
Platelets: 302 10*3/uL (ref 150–400)
RBC: 5.07 MIL/uL (ref 4.22–5.81)
RDW: 13.2 % (ref 11.5–15.5)
WBC: 6.3 10*3/uL (ref 4.0–10.5)

## 2014-11-02 LAB — COMPLETE METABOLIC PANEL WITH GFR
ALBUMIN: 4 g/dL (ref 3.6–5.1)
ALK PHOS: 105 U/L (ref 40–115)
ALT: 110 U/L — ABNORMAL HIGH (ref 9–46)
AST: 68 U/L — ABNORMAL HIGH (ref 10–35)
BUN: 20 mg/dL (ref 7–25)
CALCIUM: 9.5 mg/dL (ref 8.6–10.3)
CO2: 26 mmol/L (ref 20–31)
Chloride: 106 mmol/L (ref 98–110)
Creat: 1.2 mg/dL (ref 0.70–1.33)
GFR, EST AFRICAN AMERICAN: 78 mL/min (ref >=60)
GFR, EST NON AFRICAN AMERICAN: 67 mL/min (ref >=60)
Glucose, Bld: 126 mg/dL — ABNORMAL HIGH (ref 65–99)
POTASSIUM: 4.5 mmol/L (ref 3.5–5.3)
SODIUM: 141 mmol/L (ref 135–146)
Total Bilirubin: 0.4 mg/dL (ref 0.2–1.2)
Total Protein: 7.2 g/dL (ref 6.1–8.1)

## 2014-11-02 MED ORDER — EMTRICITABINE-TENOFOVIR AF 200-25 MG PO TABS: 1.0000 | ORAL_TABLET | Freq: Every day | ORAL | Status: DC

## 2014-11-02 MED ORDER — DOLUTEGRAVIR SODIUM 50 MG PO TABS: 50.0000 mg | ORAL_TABLET | Freq: Every day | ORAL | Status: DC

## 2014-11-02 NOTE — Patient Instructions (Signed)
Your new regimen will be :  TIVICAY ONE YELLOW PILL ONCE DAILY  AND  DESCOVY ONE LITTLE BLUE PILL ONCE DAILY  WITH OR WITHOUT FOOD

## 2014-11-02 NOTE — Progress Notes (Signed)
Chief complaint: follow-up for HIV after having stopped his medications  Subjective:    Patient ID: Chad Little, male    DOB: 07-Feb-1960, 54 y.o.   MRN: [id number]  HPI  54 year old African American man diagnosed with HIV in the 1990s but started on Atripla in the 2002 has typically been highly adherent with his Atripla though he has had an episode in 2011 where he became highly viremic with a viral load in the form thousand range after being off meds for a short period of time with been out of care and off of his Atripla for 3 months prior to seeing Dr. Linus Salmons in August. He was restarted on his Atripla at that time after his genotype and fail to show any evidence of resistance. Recheck of his viral load in early September showed to be 843 copies. There is concern that he developed resistance although I suspect it to be not have enough time to suppress his virus from likely a high viral load in August to the current value in September. He has been off the Atripla since last week when he was called and told to stop it.  We went over some other possible regimens for him that would be more friendly to kidney and bone including TRIUMEQ, ODEFSEY and GENVOYA. Since I did not know his HLA status try neck was not currently a possibility.  While ODEFSEY seemed like an appealing option to him I worried about the fact that he has rebounded very high viral loads in the past and this made me more concerned that that might not be the best option for him given that history of individuals failing ropivacaine based regimens with high viral loads. Instead I thought it would be prudent to put him on integrase ace regimen and start initially with TIVICAY and DESCOVY given its low side effect profile and high barrier to resistance. We can then consider changing him to [facility] if he is HLA B571 negative he is hepatitis B surface antigen negative.  Past Medical History  Diagnosis Date  . HIV infection   . Gout      History reviewed. No pertinent past surgical history.  Family History  Problem Relation Age of Onset  . Alcohol abuse Mother   . Hypertension Father   . Stroke Father       Social History   Social History  . Marital Status: Single    Spouse Name: N/A  . Number of Children: N/A  . Years of Education: N/A   Social History Main Topics  . Smoking status: Never Smoker   . Smokeless tobacco: Never Used  . Alcohol Use: No     Comment: "very seldom"  . Drug Use: No  . Sexual Activity: Not Currently     Comment: refused  condoms   Other Topics Concern  . None   Social History Narrative    No Known Allergies   Current outpatient prescriptions:  .  acetaminophen (TYLENOL) 500 MG tablet, Take 500 mg by mouth every 6 (six) hours as needed. For pain, Disp: , Rfl:  .  allopurinol (ZYLOPRIM) 300 MG tablet, Take 1 tablet (300 mg total) by mouth daily. (Patient not taking: Reported on 11/02/2014), Disp: 30 tablet, Rfl: 5 .  colchicine 0.6 MG tablet, 1 tab twice daily for 5 days for treatment of gout flare (Patient not taking: Reported on 10/31/2014), Disp: 10 tablet, Rfl: 3 .  dolutegravir (TIVICAY) 50 MG tablet, Take 1 tablet (50 mg  total) by mouth daily., Disp: 30 tablet, Rfl: 11 .  emtricitabine-tenofovir AF (DESCOVY) 200-25 MG tablet, Take 1 tablet by mouth daily., Disp: 30 tablet, Rfl: 11 .  indomethacin (INDOCIN) 50 MG capsule, Take 1 capsule (50 mg total) by mouth 3 (three) times daily as needed. (Patient not taking: Reported on 10/31/2014), Disp: 30 capsule, Rfl: 4 .  Multiple Vitamin (MULTIVITAMIN WITH MINERALS) TABS, Take 1 tablet by mouth daily., Disp: , Rfl:     Review of Systems  Constitutional: Negative for fever, chills, diaphoresis, activity change, appetite change, fatigue and unexpected weight change.  HENT: Negative for congestion, rhinorrhea, sinus pressure, sneezing, sore throat and trouble swallowing.   Eyes: Negative for photophobia and visual disturbance.   Respiratory: Negative for cough, chest tightness, shortness of breath, wheezing and stridor.   Cardiovascular: Negative for chest pain, palpitations and leg swelling.  Gastrointestinal: Negative for nausea, vomiting, abdominal pain, diarrhea, constipation, blood in stool, abdominal distention and anal bleeding.  Genitourinary: Negative for dysuria, hematuria, flank pain and difficulty urinating.  Musculoskeletal: Negative for myalgias, back pain, joint swelling, arthralgias and gait problem.  Skin: Negative for color change, pallor, rash and wound.  Neurological: Negative for dizziness, tremors, weakness and light-headedness.  Hematological: Negative for adenopathy. Does not bruise/bleed easily.  Psychiatric/Behavioral: Negative for behavioral problems, confusion, sleep disturbance, dysphoric mood, decreased concentration and agitation.       Objective:   Physical Exam  Constitutional: He is oriented to person, place, and time. He appears well-developed and well-nourished.  HENT:  Head: Normocephalic and atraumatic.  Eyes: Conjunctivae and EOM are normal.  Neck: Normal range of motion. Neck supple.  Cardiovascular: Normal rate and regular rhythm.   Pulmonary/Chest: Effort normal. No respiratory distress. He has no wheezes.  Abdominal: Soft. He exhibits no distension.  Musculoskeletal: Normal range of motion. He exhibits no edema or tenderness.  Neurological: He is alert and oriented to person, place, and time.  Skin: Skin is warm and dry. No rash noted. No erythema. No pallor.  Psychiatric: He has a normal mood and affect. His behavior is normal. Judgment and thought content normal.          Assessment & Plan:  HIV: Start TIVICAY and DESCOVY checking viral load and CD4 count today as well as an HLA-B27 01 recheck labs in one month's time and reassess how he is doing.  Gout: Continue with his allopurinol and colchcine  I spent greater than 25 minutes with the patient including  greater than 50% of time in face to face counsel of the patient re his HIV and various ARV regimens including the one we picked today and in coordination of their care.

## 2014-11-03 LAB — HIV-1 RNA ULTRAQUANT REFLEX TO GENTYP+
HIV 1 RNA Quant: 920 copies/mL — ABNORMAL HIGH (ref <20)
HIV-1 RNA Quant, Log: 2.96 {Log} — ABNORMAL HIGH (ref <1.30)

## 2014-11-04 LAB — T-HELPER CELL (CD4) - (RCID CLINIC ONLY)
CD4 % Helper T Cell: 30 % — ABNORMAL LOW (ref 33–55)
CD4 T Cell Abs: 480 /uL (ref 400–2700)

## 2014-11-08 LAB — HLA B*5701: HLA-B 5701 W/RFLX HLA-B HIGH: NEGATIVE

## 2014-11-14 ENCOUNTER — Ambulatory Visit: Payer: Self-pay | Admitting: Infectious Diseases

## 2014-11-16 ENCOUNTER — Other Ambulatory Visit: Payer: Self-pay | Admitting: Family Medicine

## 2014-11-16 ENCOUNTER — Encounter: Payer: Self-pay | Admitting: Family Medicine

## 2014-11-16 ENCOUNTER — Ambulatory Visit (INDEPENDENT_AMBULATORY_CARE_PROVIDER_SITE_OTHER): Payer: Self-pay | Admitting: Family Medicine

## 2014-11-16 VITALS — BP 110/88 | HR 80 | Temp 98.0°F | Resp 16 | Ht 77.0 in | Wt 214.0 lb

## 2014-11-16 DIAGNOSIS — M1009 Idiopathic gout, multiple sites: Secondary | ICD-10-CM

## 2014-11-16 DIAGNOSIS — M2550 Pain in unspecified joint: Secondary | ICD-10-CM

## 2014-11-16 DIAGNOSIS — M109 Gout, unspecified: Secondary | ICD-10-CM

## 2014-11-16 DIAGNOSIS — R030 Elevated blood-pressure reading, without diagnosis of hypertension: Secondary | ICD-10-CM

## 2014-11-16 DIAGNOSIS — R823 Hemoglobinuria: Secondary | ICD-10-CM

## 2014-11-16 LAB — POCT URINALYSIS DIP (DEVICE)
Bilirubin Urine: NEGATIVE
Glucose, UA: NEGATIVE mg/dL
HGB URINE DIPSTICK: NEGATIVE
LEUKOCYTES UA: NEGATIVE
Nitrite: NEGATIVE
PROTEIN: NEGATIVE mg/dL
UROBILINOGEN UA: 0.2 mg/dL (ref 0.0–1.0)
pH: 5.5 (ref 5.0–8.0)

## 2014-11-16 LAB — COMPLETE METABOLIC PANEL WITH GFR
ALBUMIN: 4.3 g/dL (ref 3.6–5.1)
ALK PHOS: 99 U/L (ref 40–115)
ALT: 48 U/L — ABNORMAL HIGH (ref 9–46)
AST: 34 U/L (ref 10–35)
BILIRUBIN TOTAL: 0.5 mg/dL (ref 0.2–1.2)
BUN: 18 mg/dL (ref 7–25)
CALCIUM: 9.5 mg/dL (ref 8.6–10.3)
CO2: 27 mmol/L (ref 20–31)
CREATININE: 1.26 mg/dL (ref 0.70–1.33)
Chloride: 105 mmol/L (ref 98–110)
GFR, EST AFRICAN AMERICAN: 73 mL/min (ref >=60)
GFR, EST NON AFRICAN AMERICAN: 63 mL/min (ref >=60)
Glucose, Bld: 58 mg/dL — ABNORMAL LOW (ref 65–99)
Potassium: 4.3 mmol/L (ref 3.5–5.3)
Sodium: 142 mmol/L (ref 135–146)
TOTAL PROTEIN: 7.3 g/dL (ref 6.1–8.1)

## 2014-11-16 MED ORDER — KETOROLAC TROMETHAMINE 60 MG/2ML IM SOLN
60.0000 mg | Freq: Once | INTRAMUSCULAR | Status: AC
  Administered 2014-11-16: 60 mg via INTRAMUSCULAR

## 2014-11-16 MED ORDER — ACETAMINOPHEN 500 MG PO TABS: 500.0000 mg | ORAL_TABLET | Freq: Four times a day (QID) | ORAL | Status: DC | PRN

## 2014-11-16 NOTE — Patient Instructions (Addendum)
Gout Gout is an inflammatory arthritis caused by a buildup of uric acid crystals in the joints. Uric acid is a chemical that is normally present in the blood. When the level of uric acid in the blood is too high it can form crystals that deposit in your joints and tissues. This causes joint redness, soreness, and swelling (inflammation). Repeat attacks are common. Over time, uric acid crystals can form into masses (tophi) near a joint, destroying bone and causing disfigurement. Gout is treatable and often preventable. CAUSES  The disease begins with elevated levels of uric acid in the blood. Uric acid is produced by your body when it breaks down a naturally found substance called purines. Certain foods you eat, such as meats and fish, contain high amounts of purines. Causes of an elevated uric acid level include:  Being passed down from parent to child (heredity).  Diseases that cause increased uric acid production (such as obesity, psoriasis, and certain cancers).  Excessive alcohol use.  Diet, especially diets rich in meat and seafood.  Medicines, including certain cancer-fighting medicines (chemotherapy), water pills (diuretics), and aspirin.  Chronic kidney disease. The kidneys are no longer able to remove uric acid well.  Problems with metabolism. Conditions strongly associated with gout include:  Obesity.  High blood pressure.  High cholesterol.  Diabetes. Not everyone with elevated uric acid levels gets gout. It is not understood why some people get gout and others do not. Surgery, joint injury, and eating too much of certain foods are some of the factors that can lead to gout attacks. SYMPTOMS   An attack of gout comes on quickly. It causes intense pain with redness, swelling, and warmth in a joint.  Fever can occur.  Often, only one joint is involved. Certain joints are more commonly involved:  Base of the big toe.  Knee.  Ankle.  Wrist.  Finger. Without  treatment, an attack usually goes away in a few days to weeks. Between attacks, you usually will not have symptoms, which is different from many other forms of arthritis. DIAGNOSIS  Your caregiver will suspect gout based on your symptoms and exam. In some cases, tests may be recommended. The tests may include:  Blood tests.  Urine tests.  X-rays.  Joint fluid exam. This exam requires a needle to remove fluid from the joint (arthrocentesis). Using a microscope, gout is confirmed when uric acid crystals are seen in the joint fluid. TREATMENT  There are two phases to gout treatment: treating the sudden onset (acute) attack and preventing attacks (prophylaxis).  Treatment of an Acute Attack.  Medicines are used. These include anti-inflammatory medicines or steroid medicines.  An injection of steroid medicine into the affected joint is sometimes necessary.  The painful joint is rested. Movement can worsen the arthritis.  You may use warm or cold treatments on painful joints, depending which works best for you.  Treatment to Prevent Attacks.  If you suffer from frequent gout attacks, your caregiver may advise preventive medicine. These medicines are started after the acute attack subsides. These medicines either help your kidneys eliminate uric acid from your body or decrease your uric acid production. You may need to stay on these medicines for a very long time.  The early phase of treatment with preventive medicine can be associated with an increase in acute gout attacks. For this reason, during the first few months of treatment, your caregiver may also advise you to take medicines usually used for acute gout treatment. Be sure you   understand your caregiver's directions. Your caregiver may make several adjustments to your medicine dose before these medicines are effective. °· Discuss dietary treatment with your caregiver or dietitian. Alcohol and drinks high in sugar and fructose and foods  such as meat, poultry, and seafood can increase uric acid levels. Your caregiver or dietitian can advise you on drinks and foods that should be limited. °HOME CARE INSTRUCTIONS  °· Do not take aspirin to relieve pain. This raises uric acid levels. °· Only take over-the-counter or prescription medicines for pain, discomfort, or fever as directed by your caregiver. °· Rest the joint as much as possible. When in bed, keep sheets and blankets off painful areas. °· Keep the affected joint raised (elevated). °· Apply warm or cold treatments to painful joints. Use of warm or cold treatments depends on which works best for you. °· Use crutches if the painful joint is in your leg. °· Drink enough fluids to keep your urine clear or pale yellow. This helps your body get rid of uric acid. Limit alcohol, sugary drinks, and fructose drinks. °· Follow your dietary instructions. Pay careful attention to the amount of protein you eat. Your daily diet should emphasize fruits, vegetables, whole grains, and fat-free or low-fat milk products. Discuss the use of coffee, vitamin C, and cherries with your caregiver or dietitian. These may be helpful in lowering uric acid levels. °· Maintain a healthy body weight. °SEEK MEDICAL CARE IF:  °· You develop diarrhea, vomiting, or any side effects from medicines. °· You do not feel better in 24 hours, or you are getting worse. °SEEK IMMEDIATE MEDICAL CARE IF:  °· Your joint becomes suddenly more tender, and you have chills or a fever. °MAKE SURE YOU:  °· Understand these instructions. °· Will watch your condition. °· Will get help right away if you are not doing well or get worse. °  °This information is not intended to replace advice given to you by your health care provider. Make sure you discuss any questions you have with your health care provider. °  °Document Released: 01/19/2000 Document Revised: 02/11/2014 Document Reviewed: 09/04/2011 °Elsevier Interactive Patient Education ©2016  Elsevier Inc[employer] ° °Low-Purine Diet °Purines are compounds that affect the level of uric acid in your body. A low-purine diet is a diet that is low in purines. Eating a low-purine diet can prevent the level of uric acid in your body from getting too high and causing gout or kidney stones or both. °WHAT DO I NEED TO KNOW ABOUT THIS DIET? °· Choose low-purine foods. Examples of low-purine foods are listed in the next section. °· Drink plenty of fluids, especially water. Fluids can help remove uric acid from your body. Try to drink 8-16 cups (1.9-3.8 L) a day. °· Limit foods high in fat, especially saturated fat, as fat makes it harder for the body to get rid of uric acid. Foods high in saturated fat include pizza, cheese, ice cream, whole milk, fried foods, and gravies. Choose foods that are lower in fat and lean sources of protein. Use olive oil when cooking as it contains healthy fats that are not high in saturated fat. °· Limit alcohol. Alcohol interferes with the elimination of uric acid from your body. If you are having a gout attack, avoid all alcohol. °· Keep in mind that different people's bodies react differently to different foods. You will probably learn over time which foods do or do not affect you. If you discover that a food tends to   your gout to flare up, avoid eating that food. You can more freely enjoy foods that do not cause problems. If you have any questions about a food item, talk to your dietitian or health care provider. WHICH FOODS ARE LOW, MODERATE, AND HIGH IN PURINES? The following is a list of foods that are low, moderate, and high in purines. You can eat any amount of the foods that are low in purines. You may be able to have small amounts of foods that are moderate in purines. Ask your health care provider how much of a food moderate in purines you can have. Avoid foods high in purines. Grains  Foods low in purines: Enriched white bread, pasta, rice, cake, cornbread,  popcorn.  Foods moderate in purines: Whole-grain breads and cereals, wheat germ, bran, oatmeal. Uncooked oatmeal. Dry wheat bran or wheat germ.  Foods high in purines: Pancakes, [place] toast, biscuits, muffins. Vegetables  Foods low in purines: All vegetables, except those that are moderate in purines.  Foods moderate in purines: Asparagus, cauliflower, spinach, mushrooms, green peas. Fruits  All fruits are low in purines. Meats and other Protein Foods  Foods low in purines: Eggs, nuts, peanut butter.  Foods moderate in purines: 80-90% lean beef, lamb, veal, pork, poultry, fish, eggs, peanut butter, nuts. Crab, lobster, oysters, and shrimp. Cooked dried beans, peas, and lentils.  Foods high in purines: Anchovies, sardines, herring, mussels, tuna, codfish, scallops, trout, and haddock. Berniece Salines. Organ meats (such as liver or kidney). Tripe. Game meat. Goose. Sweetbreads. Dairy  All dairy foods are low in purines. Low-fat and fat-free dairy products are best because they are low in saturated fat. Beverages  Drinks low in purines: Water, carbonated beverages, tea, coffee, cocoa.  Drinks moderate in purines: Soft drinks and other drinks sweetened with high-fructose corn syrup. Juices. To find whether a food or drink is sweetened with high-fructose corn syrup, look at the ingredients list.  Drinks high in purines: Alcoholic beverages (such as beer). Condiments  Foods low in purines: Salt, herbs, olives, pickles, relishes, vinegar.  Foods moderate in purines: Butter, margarine, oils, mayonnaise. Fats and Oils  Foods low in purines: All types, except gravies and sauces made with meat.  Foods high in purines: Gravies and sauces made with meat. Other Foods  Foods low in purines: Sugars, sweets, gelatin. Cake. Soups made without meat.  Foods moderate in purines: Meat-based or fish-based soups, broths, or bouillons. Foods and drinks sweetened with high-fructose corn syrup.  Foods high  in purines: High-fat desserts (such as ice cream, cookies, cakes, pies, doughnuts, and chocolate). Contact your dietitian for more information on foods that are not listed here.   This information is not intended to replace advice given to you by your health care provider. Make sure you discuss any questions you have with your health care provider.   Document Released: 05/18/2010 Document Revised: 01/26/2013 Document Reviewed: 12/28/2012 Elsevier Interactive Patient Education 2016 Elsevier Inc. Rheumatoid Arthritis Rheumatoid arthritis is a long-term (chronic) inflammatory disease that causes pain, swelling, and stiffness of the joints. It can affect the entire body, including the eyes and lungs. The effects of rheumatoid arthritis vary widely among those with the condition. CAUSES The cause of rheumatoid arthritis is not known. It tends to run in families and is more common in women. Certain cells of the body's natural defense system (immune system) do not work properly and begin to attack healthy joints. It primarily involves the connective tissue that lines the joints (synovial membrane). This can  cause damage to the joint. SYMPTOMS  Pain, stiffness, swelling, and decreased motion of many joints, especially in the hands and feet.  Stiffness that is worse in the morning. It may last 1-2 hours or longer.  Numbness and tingling in the hands.  Fatigue.  Loss of appetite.  Weight loss.  Low-grade fever.  Dry eyes and mouth.  Firm lumps (rheumatoid nodules) that grow beneath the skin in areas such as the elbows and hands. DIAGNOSIS Diagnosis is based on the symptoms described, an exam, and blood tests. Sometimes, X-rays are helpful. TREATMENT The goals of treatment are to relieve pain, reduce inflammation, and to slow down or stop joint damage and disability. Methods vary and may include:  Maintaining a balance of rest, exercise, and proper nutrition.  Your health care provider may  adjust your medicines every 3 months until treatment goals are reached. Common medicines include:  Pain relievers (analgesics).  Corticosteroids and nonsteroidal anti-inflammatory drugs (NSAIDs) to reduce inflammation.  Disease-modifying antirheumatic drugs (DMARDs) to try to slow the course of the disease.  Biologic response modifiers to reduce inflammation and damage.  Physical therapy and occupational therapy.  Surgery for patients with severe joint damage. Joint replacement or fusing of joints may be needed.  Routine monitoring and ongoing care, such as office visits, blood and urine tests, and X-rays. Your health care provider will work with you to identify the best treatment option for you, based on an assessment of the overall disease activity in your body. HOME CARE INSTRUCTIONS  Remain physically active and reduce activity when the disease gets worse.  Eat a well-balanced diet.  Put heat on affected joints when you wake up and before activities. Keep the heat on the affected joint for as long as directed by your health care provider.  Put ice on affected joints following activities or exercising.  Put ice in a plastic bag.  Place a towel between your skin and the bag.  Leave the ice on for 15-20 minutes, 3-4 times per day, or as directed by your health care provider.  Take medicines and supplements only as directed by your health care provider.  Use splints as directed by your health care provider. Splints help maintain joint position and function.  Do not sleep with pillows under your knees. This may lead to spasms.  Participate in a self-management program to keep current with the latest treatment and coping skills. SEEK IMMEDIATE MEDICAL CARE IF:  You have fainting episodes.  You have periods of extreme weakness.  You rapidly develop a hot, painful joint that is more severe than usual joint aches.  You have chills.  You have a fever. FOR MORE  INFORMATION  [employer] Rheumat[employer]: www.rheumatology.[place]: www.arthritis.org   This information is not intended to replace advice given to you by your health care provider. Make sure you discuss any questions you have with your health care provider.   Document Released: 01/19/2000 Document Revised: 02/11/2014 Document Reviewed: 02/27/2011 Elsevier Interactive Patient Education [employer].

## 2014-11-16 NOTE — Progress Notes (Signed)
Subjective:    Patient ID: LEV CERVONE, male    DOB: 1960-10-08, 54 y.o.   MRN: [id number]  HPI  Mr. Leanord Thibeau, a 54 year old with a history of HIV, gout, and osteoarthritis presents complaining of pain to multiple joints.   He is complaining of pain to multiple joints. He had also been without gout medications for several months.He was given prescriptions for allopurinol and Indomethacin he states that he is taking medications consistently. Gout pain has improved, but he continues to have increased pain in shoulder, hips and knees. .  Attacks occur primarily in the lower extremites. Patient reports his chronic pain is 7/10, his joint stiffness is daily and his joint swelling occurs periodically.   Condition is exacerbated by increased activity. Mr. Vandunk reports a family history of rheumatoid arthritis and osteoarthritis. He states that he last had Aleve 2 days ago with moderate relief.   Past Medical History  Diagnosis Date  . HIV infection   . Gout    Immunization History  Administered Date(s) Administered  . Hepatitis B 05/18/2007, 04/04/2008, 05/13/2011  . Influenza Split 11/21/2010, 12/11/2011  . Influenza Whole 12/07/2007, 11/28/2008, 12/20/2009  . Influenza,inj,Quad PF,36+ Mos 11/26/2012, 03/09/2014, 10/20/2014  . PPD Test 02/12/2010  . Pneumococcal Polysaccharide-23 05/18/2007, 07/06/2012  No Known Allergies    Medication List       This list is accurate as of: 11/16/14  2:19 PM.  Always use your most recent med list.               acetaminophen 500 MG tablet  Commonly known as:  TYLENOL  Take 500 mg by mouth every 6 (six) hours as needed. For pain     allopurinol 300 MG tablet  Commonly known as:  ZYLOPRIM  Take 1 tablet (300 mg total) by mouth daily.     colchicine 0.6 MG tablet  1 tab twice daily for 5 days for treatment of gout flare     dolutegravir 50 MG tablet  Commonly known as:  TIVICAY  Take 1 tablet (50 mg total) by mouth daily.      emtricitabine-tenofovir AF 200-25 MG tablet  Commonly known as:  DESCOVY  Take 1 tablet by mouth daily.     indomethacin 50 MG capsule  Commonly known as:  INDOCIN  Take 1 capsule (50 mg total) by mouth 3 (three) times daily as needed.     multivitamin with minerals Tabs tablet  Take 1 tablet by mouth daily.       Review of Systems  Constitutional: Negative for fatigue.  Eyes: Negative for photophobia and visual disturbance.  Respiratory: Negative.   Cardiovascular: Negative.  Negative for chest pain, palpitations and leg swelling.  Gastrointestinal: Negative for nausea and diarrhea.  Endocrine: Negative for polydipsia, polyphagia and polyuria.  Genitourinary: Negative.   Musculoskeletal: Positive for arthralgias.  Allergic/Immunologic: Positive for immunocompromised state.  Neurological: Negative for dizziness and numbness.  Hematological: Negative.   Psychiatric/Behavioral: Positive for sleep disturbance. Negative for suicidal ideas.       Objective:   Physical Exam  Constitutional: He is oriented to person, place, and time. He appears well-developed and well-nourished.  HENT:  Head: Normocephalic and atraumatic.  Right Ear: External ear normal.  Left Ear: External ear normal.  Mouth/Throat: Oropharynx is clear and moist.  Eyes: Conjunctivae and EOM are normal. Pupils are equal, round, and reactive to light.  Neck: Normal range of motion. Neck supple.  Cardiovascular: Normal rate, regular rhythm, normal  heart sounds and intact distal pulses.   Pulmonary/Chest: Effort normal and breath sounds normal.  Abdominal: Soft. Bowel sounds are normal.  Musculoskeletal:       Right shoulder: He exhibits decreased range of motion, tenderness and pain. He exhibits no swelling.       Left shoulder: He exhibits decreased range of motion and tenderness.       Right knee: He exhibits decreased range of motion. He exhibits no swelling. Tenderness found.       Left knee: He  exhibits decreased range of motion. He exhibits no swelling. Tenderness found.  Neurological: He is alert and oriented to person, place, and time. He has normal reflexes.  Skin: Skin is warm and dry.  Psychiatric: He has a normal mood and affect. His behavior is normal. Judgment and thought content normal.         BP 110/88 mmHg  Pulse 80  Temp(Src) 98 F (36.7 C) (Oral)  Resp 16  Ht 6\' 5"  (1.956 m)  Wt 214 lb (97.07 kg)  BMI 25.37 kg/m2 Assessment & Plan:  1. Arthralgia of multiple sites, bilateral Mr. Betts reports that he has increased pain to joints in shoulders, hips, and knees. Will rule out rheumatoid arthritis. Will check urine for proteinuria. Will also check renal functioning.  - Cyclic Citrul Peptide Antibody - ketorolac (TORADOL) injection 60 mg; Inject 2 mLs (60 mg total) into the muscle once. - acetaminophen (TYLENOL) 500 MG tablet; Take 1 tablet (500 mg total) by mouth every 6 (six) hours as needed. For pain  Dispense: 30 tablet; Refill: 0 - COMPLETE METABOLIC PANEL WITH GFR  2. Gouty arthritis Continue Indocin and allopurinol as prescribed. Reviewed previous uric acid, within normal range.   3. Hemoglobinuria Reviewed urinalysis from 9/26, trace hemoglobin. Will re-check urinalysis - POCT urinalysis dipstick  4. Prehypertension Blood pressure is at goal with diet and increased daily activity    Affan Callow M, FNP     The patient was given clear instructions to go to ER or return to medical center if symptoms do not improve, worsen or new problems develop. The patient verbalized understanding. Will notify patient with laboratory results.

## 2014-11-17 LAB — CYCLIC CITRUL PEPTIDE ANTIBODY, IGG: Cyclic Citrullin Peptide Ab: 25 Units — ABNORMAL HIGH

## 2014-11-18 ENCOUNTER — Telehealth: Payer: Self-pay | Admitting: Family Medicine

## 2014-11-18 LAB — RHEUMATOID FACTOR: Rhuematoid fact SerPl-aCnc: <10 IU/mL (ref <=14)

## 2014-11-18 NOTE — Telephone Encounter (Signed)
Opened in error

## 2014-12-01 ENCOUNTER — Ambulatory Visit: Payer: Self-pay | Admitting: Family Medicine

## 2014-12-01 ENCOUNTER — Other Ambulatory Visit: Payer: Self-pay | Admitting: *Deleted

## 2014-12-01 ENCOUNTER — Telehealth: Payer: Self-pay | Admitting: *Deleted

## 2014-12-01 DIAGNOSIS — M109 Gout, unspecified: Secondary | ICD-10-CM

## 2014-12-01 MED ORDER — ALLOPURINOL 300 MG PO TABS: 300.0000 mg | ORAL_TABLET | Freq: Every day | ORAL | Status: DC

## 2014-12-01 MED ORDER — COLCHICINE 0.6 MG PO TABS: ORAL_TABLET | ORAL | Status: DC

## 2014-12-01 NOTE — Telephone Encounter (Signed)
Patient called stating he needs help with his gout medication. He previously got this through patient assistance. Rxs printed and signed by Dr. Linus Salmons and placed on Erin's desk for assistance. He will call her on Monday. Myrtis Hopping

## 2014-12-05 NOTE — Telephone Encounter (Signed)
Called patient back, informed him of two different programs we can go through to cover his gout medication. Printed the applications, patient says he will be here on Wednesday and will take care of it then. Samara Snide

## 2014-12-07 ENCOUNTER — Ambulatory Visit: Payer: Self-pay | Admitting: Family Medicine

## 2014-12-13 ENCOUNTER — Ambulatory Visit (INDEPENDENT_AMBULATORY_CARE_PROVIDER_SITE_OTHER): Payer: Self-pay | Admitting: Family Medicine

## 2014-12-13 VITALS — BP 132/88 | HR 71 | Temp 97.6°F | Resp 14 | Ht 77.0 in | Wt 222.0 lb

## 2014-12-13 DIAGNOSIS — Z8719 Personal history of other diseases of the digestive system: Secondary | ICD-10-CM

## 2014-12-13 DIAGNOSIS — B20 Human immunodeficiency virus [HIV] disease: Secondary | ICD-10-CM

## 2014-12-13 DIAGNOSIS — M159 Polyosteoarthritis, unspecified: Secondary | ICD-10-CM

## 2014-12-13 DIAGNOSIS — R748 Abnormal levels of other serum enzymes: Secondary | ICD-10-CM

## 2014-12-13 DIAGNOSIS — M109 Gout, unspecified: Secondary | ICD-10-CM

## 2014-12-13 DIAGNOSIS — M1009 Idiopathic gout, multiple sites: Secondary | ICD-10-CM

## 2014-12-13 DIAGNOSIS — N400 Enlarged prostate without lower urinary tract symptoms: Secondary | ICD-10-CM

## 2014-12-13 DIAGNOSIS — M150 Primary generalized (osteo)arthritis: Secondary | ICD-10-CM

## 2014-12-13 LAB — COMPLETE METABOLIC PANEL WITH GFR
ALBUMIN: 4 g/dL (ref 3.6–5.1)
ALK PHOS: 101 U/L (ref 40–115)
ALT: 40 U/L (ref 9–46)
AST: 36 U/L — ABNORMAL HIGH (ref 10–35)
BILIRUBIN TOTAL: 0.4 mg/dL (ref 0.2–1.2)
BUN: 18 mg/dL (ref 7–25)
CO2: 26 mmol/L (ref 20–31)
CREATININE: 1.09 mg/dL (ref 0.70–1.33)
Calcium: 9.3 mg/dL (ref 8.6–10.3)
Chloride: 104 mmol/L (ref 98–110)
GFR, EST NON AFRICAN AMERICAN: 75 mL/min (ref >=60)
GFR, Est African American: 87 mL/min (ref >=60)
GLUCOSE: 92 mg/dL (ref 65–99)
Potassium: 4.4 mmol/L (ref 3.5–5.3)
SODIUM: 139 mmol/L (ref 135–146)
TOTAL PROTEIN: 7.2 g/dL (ref 6.1–8.1)

## 2014-12-13 LAB — HEMOCCULT GUIAC POC 1CARD (OFFICE): Fecal Occult Blood, POC: NEGATIVE

## 2014-12-13 NOTE — Progress Notes (Signed)
Subjective:    Patient ID: Chad Little, male    DOB: 08/13/1960, 54 y.o.   MRN: [id number]  HPI  [name], a 54 year old with a history of HIV, gout, and osteoarthritis presents complaining of pain to multiple joints.   He is complaining of pain to multiple joints. .He was given prescriptions for allopurinol and Indomethacin he states that he is taking medications consistently. Gout pain has improved, but he continues to have increased pain in shoulder, hips and knees Patient reports his chronic pain is 5/10, his joint stiffness is daily and his joint swelling occurs periodically.   Condition is exacerbated by increased activity. [name] reports a family history of rheumatoid arthritis and osteoarthritis. He states that he last had Aleve on yesterday with moderate relief.   Past Medical History  Diagnosis Date  . HIV infection ([place])   . Gout    Immunization History  Administered Date(s) Administered  . Hepatitis B 05/18/2007, 04/04/2008, 05/13/2011  . Influenza Split 11/21/2010, 12/11/2011  . Influenza Whole 12/07/2007, 11/28/2008, 12/20/2009  . Influenza,inj,Quad PF,36+ Mos 11/26/2012, 03/09/2014, 10/20/2014  . PPD Test 02/12/2010  . Pneumococcal Polysaccharide-23 05/18/2007, 07/06/2012  No Known Allergies    Medication List       This list is accurate as of: 12/13/14  8:35 AM.  Always use your most recent med list.               acetaminophen 500 MG tablet  Commonly known as:  TYLENOL  Take 1 tablet (500 mg total) by mouth every 6 (six) hours as needed. For pain     allopurinol 300 MG tablet  Commonly known as:  ZYLOPRIM  Take 1 tablet (300 mg total) by mouth daily.     colchicine 0.6 MG tablet  1 tab twice daily for 5 days for treatment of gout flare     dolutegravir 50 MG tablet  Commonly known as:  TIVICAY  Take 1 tablet (50 mg total) by mouth daily.     emtricitabine-tenofovir AF 200-25 MG tablet  Commonly known as:  DESCOVY  Take 1  tablet by mouth daily.     indomethacin 50 MG capsule  Commonly known as:  INDOCIN  Take 1 capsule (50 mg total) by mouth 3 (three) times daily as needed.     multivitamin with minerals Tabs tablet  Take 1 tablet by mouth daily.       Review of Systems  Constitutional: Negative for fatigue.  Eyes: Negative for photophobia and visual disturbance.  Respiratory: Negative.   Cardiovascular: Negative.  Negative for chest pain, palpitations and leg swelling.  Gastrointestinal: Negative for nausea and diarrhea.  Endocrine: Negative for polydipsia, polyphagia and polyuria.  Genitourinary: Negative.   Musculoskeletal: Positive for arthralgias.  Allergic/Immunologic: Positive for immunocompromised state.  Neurological: Negative for dizziness and numbness.  Hematological: Negative.   Psychiatric/Behavioral: Positive for sleep disturbance. Negative for suicidal ideas.        Objective:   Physical Exam  Constitutional: He is oriented to person, place, and time. He appears well-developed and well-nourished.  HENT:  Head: Normocephalic and atraumatic.  Right Ear: External ear normal.  Left Ear: External ear normal.  Mouth/Throat: Oropharynx is clear and moist.  Eyes: Conjunctivae and EOM are normal. Pupils are equal, round, and reactive to light.  Neck: Normal range of motion. Neck supple.  Cardiovascular: Normal rate, regular rhythm, normal heart sounds and intact distal pulses.   Pulmonary/Chest: Effort normal and breath sounds  normal.  Abdominal: Soft. Bowel sounds are normal.  Musculoskeletal:       Right shoulder: He exhibits decreased range of motion, tenderness and pain. He exhibits no swelling.       Left shoulder: He exhibits decreased range of motion and tenderness.       Right knee: He exhibits decreased range of motion. He exhibits no swelling. Tenderness found.       Left knee: He exhibits decreased range of motion. He exhibits no swelling. Tenderness found.  Neurological:  He is alert and oriented to person, place, and time. He has normal reflexes.  Skin: Skin is warm and dry.  Psychiatric: He has a normal mood and affect. His behavior is normal. Judgment and thought content normal.      BP 132/88 mmHg  Pulse 71  Temp(Src) 97.6 F (36.4 C) (Oral)  Resp 14  Ht 6\' 5"  (1.956 m)  Wt 222 lb (100.699 kg)  BMI 26.32 kg/m2 Assessment & Plan:   1. Primary osteoarthritis involving multiple joints [name] has a history of osteoarthritis.  Patient has a positive CCP, will send a referral to rheumatologist. Patient is requesting a Toradol injection as previously given, not given due to occasional blood in stool. Patient expressed understanding.   2. Gouty arthritis Continue medications as previously prescribed.   3. HIV disease ([place]) Patient to follow up with Dr. Tommy Medal as previously scheduled.   4. History of rectal bleeding [name] states that he has occasional blood in stool and on the tissue with wiping. Hemoccult negative. Patient in the process of applying for insurance. Will send for screening colonoscopy.   - Hemoccult - 1 Card (office)  5. Enlarged prostate Prostate mildly enlarged. Will check PSA. Patient currently asymptomatic.  - PSA  6. Elevated liver enzymes - COMPLETE METABOLIC PANEL WITH GFR    RTC: as needed  Berenis Corter M, FNP     The patient was given clear instructions to go to ER or return to medical center if symptoms do not improve, worsen or new problems develop. The patient verbalized understanding. Will notify patient with laboratory results.

## 2014-12-13 NOTE — Patient Instructions (Addendum)
Recommend follow up at [facility]. M-F 8am-3pm walk in clinic.  [address], [place]  Arthritis Arthritis is a term that is commonly used to refer to joint pain or joint disease. There are more than 100 types of arthritis. CAUSES The most common cause of this condition is wear and tear of a joint. Other causes include:  Gout.  Inflammation of a joint.  An infection of a joint.  Sprains and other injuries near the joint.  A drug reaction or allergic reaction. In some cases, the cause may not be known. SYMPTOMS The main symptom of this condition is pain in the joint with movement. Other symptoms include:  Redness, swelling, or stiffness at a joint.  Warmth coming from the joint.  Fever.  Overall feeling of illness. DIAGNOSIS This condition may be diagnosed with a physical exam and tests, including:  Blood tests.  Urine tests.  Imaging tests, such as MRI, X-rays, or a CT scan. Sometimes, fluid is removed from a joint for testing. TREATMENT Treatment for this condition may involve:  Treatment of the cause, if it is known.  Rest.  Raising (elevating) the joint.  Applying cold or hot packs to the joint.  Medicines to improve symptoms and reduce inflammation.  Injections of a steroid such as cortisone into the joint to help reduce pain and inflammation. Depending on the cause of your arthritis, you may need to make lifestyle changes to reduce stress on your joint. These changes may include exercising more and losing weight. HOME CARE INSTRUCTIONS Medicines  Take over-the-counter and prescription medicines only as told by your health care provider.  Do not take aspirin to relieve pain if gout is suspected. Activities  Rest your joint if told by your health care provider. Rest is important when your disease is active and your joint feels painful, swollen, or stiff.  Avoid activities that make the pain worse. It is  important to balance activity with rest.  Exercise your joint regularly with range-of-motion exercises as told by your health care provider. Try doing low-impact exercise, such as:  Swimming.  Water aerobics.  Biking.  Walking. Joint Care  If your joint is swollen, keep it elevated if told by your health care provider.  If your joint feels stiff in the morning, try taking a warm shower.  If directed, apply heat to the joint. If you have diabetes, do not apply heat without permission from your health care provider.  Put a towel between the joint and the hot pack or heating pad.  Leave the heat on the area for 20-30 minutes.  If directed, apply ice to the joint:  Put ice in a plastic bag.  Place a towel between your skin and the bag.  Leave the ice on for 20 minutes, 2-3 times per day.  Keep all follow-up visits as told by your health care provider. This is important. SEEK MEDICAL CARE IF:  The pain gets worse.  You have a fever. SEEK IMMEDIATE MEDICAL CARE IF:  You develop severe joint pain, swelling, or redness.  Many joints become painful and swollen.  You develop severe back pain.  You develop severe weakness in your leg.  You cannot control your bladder or bowels.   This information is not intended to replace advice given to you by your health care provider. Make sure you discuss any questions you have with your health care provider.   Document Released: 02/29/2004 Document Revised: 10/12/2014 Document Reviewed: 04/18/2014 Elsevier  Interactive Patient Education 2016 [place]. High-Fiber Diet Fiber, also called dietary fiber, is a type of carbohydrate found in fruits, vegetables, whole grains, and beans. A high-fiber diet can have many health benefits. Your health care provider may recommend a high-fiber diet to help:  Prevent constipation. Fiber can make your bowel movements more regular.  Lower your cholesterol.  Relieve hemorrhoids, uncomplicated  diverticulosis, or irritable bowel syndrome.  Prevent overeating as part of a weight-loss plan.  Prevent heart disease, type 2 diabetes, and certain cancers. WHAT IS MY PLAN? The recommended daily intake of fiber includes:  38 grams for men under age 106.  76 grams for men over age 47.  48 grams for women under age 85.  17 grams for women over age 54. You can get the recommended daily intake of dietary fiber by eating a variety of fruits, vegetables, grains, and beans. Your health care provider may also recommend a fiber supplement if it is not possible to get enough fiber through your diet. WHAT DO I NEED TO KNOW ABOUT A HIGH-FIBER DIET?  Fiber supplements have not been widely studied for their effectiveness, so it is better to get fiber through food sources.  Always check the fiber content on thenutrition facts label of any prepackaged food. Look for foods that contain at least 5 grams of fiber per serving.  Ask your dietitian if you have questions about specific foods that are related to your condition, especially if those foods are not listed in the following section.  Increase your daily fiber consumption gradually. Increasing your intake of dietary fiber too quickly may cause bloating, cramping, or gas.  Drink plenty of water. Water helps you to digest fiber. WHAT FOODS CAN I EAT? Grains Whole-grain breads. Multigrain cereal. Oats and oatmeal. Brown rice. Barley. Bulgur wheat. [place]. Bran muffins. Popcorn. Rye wafer crackers. Vegetables Sweet potatoes. Spinach. Kale. Artichokes. Cabbage. Broccoli. Green peas. Carrots. Squash. Fruits Berries. Pears. Apples. Oranges. Avocados. Prunes and raisins. Dried figs. Meats and Other Protein Sources [place], kidney, pinto, and soy beans. Split peas. Lentils. Nuts and seeds. Dairy Fiber-fortified yogurt. Beverages Fiber-fortified soy milk. Fiber-fortified orange juice. Other Fiber bars. The items listed above may not be a complete  list of recommended foods or beverages. Contact your dietitian for more options. WHAT FOODS ARE NOT RECOMMENDED? Grains White bread. Pasta made with refined flour. White rice. Vegetables Fried potatoes. Canned vegetables. Well-cooked vegetables.  Fruits Fruit juice. Cooked, strained fruit. Meats and Other Protein Sources Fatty cuts of meat. Fried Sales executive or fried fish. Dairy Milk. Yogurt. Cream cheese. Sour cream. Beverages Soft drinks. Other Cakes and pastries. Butter and oils. The items listed above may not be a complete list of foods and beverages to avoid. Contact your dietitian for more information. WHAT ARE SOME TIPS FOR INCLUDING HIGH-FIBER FOODS IN MY DIET?  Eat a wide variety of high-fiber foods.  Make sure that half of all grains consumed each day are whole grains.  Replace breads and cereals made from refined flour or white flour with whole-grain breads and cereals.  Replace white rice with brown rice, bulgur wheat, or millet.  Start the day with a breakfast that is high in fiber, such as a cereal that contains at least 5 grams of fiber per serving.  Use beans in place of meat in soups, salads, or pasta.  Eat high-fiber snacks, such as berries, raw vegetables, nuts, or popcorn.   This information is not intended to replace advice given to you by your  health care provider. Make sure you discuss any questions you have with your health care provider.   Document Released: 01/21/2005 Document Revised: 02/11/2014 Document Reviewed: 07/06/2013 Elsevier Interactive Patient Education [employer].

## 2014-12-14 ENCOUNTER — Encounter: Payer: Self-pay | Admitting: Family Medicine

## 2014-12-14 ENCOUNTER — Telehealth: Payer: Self-pay

## 2014-12-14 LAB — PSA: PSA: 0.85 ng/mL (ref <=4.00)

## 2014-12-14 NOTE — Telephone Encounter (Signed)
Called and spoke with patient and advised of normal labs and to start tylenol 500mg  every 6 hours as needed. Will send referral after patient re-applies for orange card. Thanks!

## 2014-12-14 NOTE — Telephone Encounter (Signed)
-----   Message from Dorena Dew, [place] sent at 12/14/2014  7:10 AM EST ----- Please inform Chad Little that PSA was normal. Will start Tylenol 500 mg every 6 hours as needed for osteoarthriits. Will send referral to rheumatologist. Remind patient to apply for [facility] or [employer].   Thanks! ----- Message -----    From: Adelina Mings, LPN    Sent: 11/06/1592   9:06 AM      To: Dorena Dew, FNP

## 2014-12-22 ENCOUNTER — Encounter: Payer: Self-pay | Admitting: Infectious Diseases

## 2014-12-22 ENCOUNTER — Ambulatory Visit (INDEPENDENT_AMBULATORY_CARE_PROVIDER_SITE_OTHER): Payer: Self-pay | Admitting: Infectious Diseases

## 2014-12-22 ENCOUNTER — Ambulatory Visit: Payer: Self-pay

## 2014-12-22 VITALS — Temp 97.7°F | Ht 77.0 in | Wt 220.0 lb

## 2014-12-22 DIAGNOSIS — G4700 Insomnia, unspecified: Secondary | ICD-10-CM

## 2014-12-22 DIAGNOSIS — R030 Elevated blood-pressure reading, without diagnosis of hypertension: Secondary | ICD-10-CM

## 2014-12-22 DIAGNOSIS — F4310 Post-traumatic stress disorder, unspecified: Secondary | ICD-10-CM

## 2014-12-22 DIAGNOSIS — B20 Human immunodeficiency virus [HIV] disease: Secondary | ICD-10-CM

## 2014-12-22 MED ORDER — TRAZODONE HCL 50 MG PO TABS: 25.0000 mg | ORAL_TABLET | Freq: Every evening | ORAL | Status: DC | PRN

## 2014-12-22 NOTE — Assessment & Plan Note (Signed)
Doing well on new ART Will check his CD4 and VL today.  Offered/refused condoms.

## 2014-12-22 NOTE — Progress Notes (Signed)
   Subjective:    Patient ID: Chad Little, male    DOB: 1960/05/04, 54 y.o.   MRN: [id number]  HPI 54 yo M with hx of HIV+ since 15s. He was previously maintained on [place] for many years. He was switched to DTGV/descovy this summer.  He also was seen this fall by PCP for gout.  Hep B S Ab indeterm 11-2010.  Has been doing well with his new ART.  No problems- states his gout is well controlled and now osteoarthritis. 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    HIV 1 RNA QUANT (copies/mL)  Date Value  11/02/2014 920*  10/20/2014 847*  03/09/2014 <20   CD4 T CELL ABS (/uL)  Date Value  11/02/2014 480  09/15/2014 370*  03/09/2014 560    Review of Systems  Constitutional: Negative for appetite change and unexpected weight change.  Gastrointestinal: Negative for diarrhea and constipation.  Genitourinary: Negative for difficulty urinating.  Musculoskeletal: Positive for arthralgias.       Objective:   Physical Exam  Constitutional: He appears well-developed and well-nourished.  HENT:  Mouth/Throat: No oropharyngeal exudate.  Eyes: EOM are normal. Pupils are equal, round, and reactive to light.  Neck: Neck supple.  Cardiovascular: Normal rate, regular rhythm and normal heart sounds.   Pulmonary/Chest: Effort normal and breath sounds normal.  Abdominal: Soft. Bowel sounds are normal. There is no tenderness. There is no rebound.  Musculoskeletal: He exhibits no edema.  Lymphadenopathy:    He has no cervical adenopathy.       Assessment & Plan:

## 2014-12-22 NOTE — Assessment & Plan Note (Signed)
Will start him on trazadone, low dose

## 2014-12-22 NOTE — Assessment & Plan Note (Signed)
Will continue to watch

## 2014-12-22 NOTE — Addendum Note (Signed)
Addended by: HATCHER, JEFFREY C on: 12/22/2014 12:11 PM   Modules accepted: Orders

## 2014-12-22 NOTE — BH Specialist Note (Signed)
Chad Little continues to complain about flashbacks and poor sleep due to multiple past traumas, which have been exacerbated recently by the death of his brother and from hearing about [name].  I talked some more about EMDR and suggested we try 2 guided mental exercises that are used as precursors to EMDR.  He agreed to this and we did the "container" exercise, where he "put away" several traumatic events, and then followed with the "safe place" exercise where he imagined driving down a country road outside of [place], [place].  He said he definitely felt better afterward.  Then we discussed his sleep problems and I discussed this with Dr. Johnnye Sima, who prescribed trazadone for him.  Chad Little had told me this has worked in the past. I was able to catch him before he left the building to tell him that was being called in to his pharmacy and he thanked me. Plan to meet in 2 weeks and tentatively will do an EMDR session. [name], LCSW

## 2014-12-23 LAB — T-HELPER CELL (CD4) - (RCID CLINIC ONLY)
CD4 T CELL HELPER: 32 % — AB (ref 33–55)
CD4 T Cell Abs: 580 /uL (ref 400–2700)

## 2015-01-05 ENCOUNTER — Ambulatory Visit: Payer: Self-pay

## 2015-01-11 ENCOUNTER — Ambulatory Visit: Payer: Self-pay | Admitting: Infectious Diseases

## 2015-01-24 ENCOUNTER — Ambulatory Visit: Payer: Self-pay

## 2015-02-14 ENCOUNTER — Other Ambulatory Visit: Payer: Self-pay | Admitting: Infectious Diseases

## 2015-02-14 DIAGNOSIS — Z79899 Other long term (current) drug therapy: Secondary | ICD-10-CM

## 2015-02-14 DIAGNOSIS — B20 Human immunodeficiency virus [HIV] disease: Secondary | ICD-10-CM

## 2015-02-14 DIAGNOSIS — Z113 Encounter for screening for infections with a predominantly sexual mode of transmission: Secondary | ICD-10-CM

## 2015-02-15 ENCOUNTER — Ambulatory Visit: Payer: Self-pay

## 2015-02-20 ENCOUNTER — Ambulatory Visit: Payer: Self-pay | Admitting: Family Medicine

## 2015-06-21 ENCOUNTER — Ambulatory Visit: Payer: Self-pay | Admitting: Infectious Diseases

## 2015-06-28 ENCOUNTER — Ambulatory Visit: Payer: Self-pay

## 2015-07-18 ENCOUNTER — Telehealth: Payer: Self-pay | Admitting: *Deleted

## 2015-07-18 NOTE — Telephone Encounter (Signed)
Called patient and unable to leave a voice mail. Called and spoke to his sister Malachi Bonds (emergency contact). She will go by his house today and ask Fareed to call me. Please transfer call if patient calls back. Myrtis Hopping

## 2015-07-21 ENCOUNTER — Ambulatory Visit: Payer: Self-pay | Admitting: Infectious Diseases

## 2015-08-23 ENCOUNTER — Other Ambulatory Visit: Payer: Self-pay | Admitting: *Deleted

## 2015-08-23 DIAGNOSIS — B20 Human immunodeficiency virus [HIV] disease: Secondary | ICD-10-CM

## 2015-08-23 MED ORDER — EMTRICITABINE-TENOFOVIR AF 200-25 MG PO TABS
1.0000 | ORAL_TABLET | Freq: Every day | ORAL | Status: DC
Start: 2015-08-23 — End: 2019-03-03

## 2015-08-23 MED ORDER — DOLUTEGRAVIR SODIUM 50 MG PO TABS: 50.0000 mg | ORAL_TABLET | Freq: Every day | ORAL | Status: DC

## 2015-08-23 NOTE — Telephone Encounter (Signed)
Pt's sister asked that refill be sent to [facility] in [place], [place].  Pt is already at the [facility] in [place].  Pt has ADAP approval through 11/04/15.  RN reminded the pt's sister that the patient will need to reapply for RW/ADAP prior to 11/04/15 or he will not be able to continue his HIV medications.

## 2015-09-07 ENCOUNTER — Telehealth: Payer: Self-pay | Admitting: *Deleted

## 2015-09-07 NOTE — Telephone Encounter (Signed)
Received signed release of information for patient from the [facility] Department. Patient is currently receiving care there. They are requesting most recent medical records - labs, notes, and medications. Faxed as requested to attention [employer]. [phone]

## 2015-09-27 ENCOUNTER — Telehealth: Payer: Self-pay | Admitting: *Deleted

## 2015-09-27 NOTE — Telephone Encounter (Signed)
Patient called to find out if there was a way to get his bottom dentures sent to [facility] where he is currently staying. Dr. Berna Bue office stated he has not had the mold made yet, even though the patient thought he had. Spoke to [place] and she is going to find out if patient can be seen in [place]. She was given the # [phone].

## 2015-09-30 DIAGNOSIS — G44219 Episodic tension-type headache, not intractable: Secondary | ICD-10-CM | POA: Insufficient documentation

## 2015-09-30 DIAGNOSIS — I10 Essential (primary) hypertension: Secondary | ICD-10-CM | POA: Insufficient documentation

## 2018-08-14 ENCOUNTER — Encounter: Payer: Self-pay | Admitting: Family

## 2019-02-16 ENCOUNTER — Other Ambulatory Visit: Payer: Self-pay

## 2019-02-16 ENCOUNTER — Other Ambulatory Visit: Payer: Medicaid Other

## 2019-02-16 ENCOUNTER — Ambulatory Visit: Payer: Medicaid Other

## 2019-02-16 DIAGNOSIS — B20 Human immunodeficiency virus [HIV] disease: Secondary | ICD-10-CM

## 2019-02-17 LAB — T-HELPER CELL (CD4) - (RCID CLINIC ONLY)
CD4 % Helper T Cell: 35 % (ref 33–65)
CD4 T Cell Abs: 647 /uL (ref 400–1790)

## 2019-02-20 LAB — CBC WITH DIFFERENTIAL/PLATELET
Absolute Monocytes: 672 cells/uL (ref 200–950)
Basophils Absolute: 88 cells/uL (ref 0–200)
Basophils Relative: 1.2 %
Eosinophils Absolute: 402 cells/uL (ref 15–500)
Eosinophils Relative: 5.5 %
HCT: 48.2 % (ref 38.5–50.0)
Hemoglobin: 16.8 g/dL (ref 13.2–17.1)
Lymphs Abs: 1854 cells/uL (ref 850–3900)
MCH: 32.6 pg (ref 27.0–33.0)
MCHC: 34.9 g/dL (ref 32.0–36.0)
MCV: 93.4 fL (ref 80.0–100.0)
MPV: 9.9 fL (ref 7.5–12.5)
Monocytes Relative: 9.2 %
Neutro Abs: 4285 cells/uL (ref 1500–7800)
Neutrophils Relative %: 58.7 %
Platelets: 306 10*3/uL (ref 140–400)
RBC: 5.16 10*6/uL (ref 4.20–5.80)
RDW: 12.8 % (ref 11.0–15.0)
Total Lymphocyte: 25.4 %
WBC: 7.3 10*3/uL (ref 3.8–10.8)

## 2019-02-20 LAB — COMPLETE METABOLIC PANEL WITH GFR
AG Ratio: 1.4 (calc) (ref 1.0–2.5)
ALT: 19 U/L (ref 9–46)
AST: 24 U/L (ref 10–35)
Albumin: 4.4 g/dL (ref 3.6–5.1)
Alkaline phosphatase (APISO): 96 U/L (ref 35–144)
BUN/Creatinine Ratio: 10 (calc) (ref 6–22)
BUN: 14 mg/dL (ref 7–25)
CO2: 28 mmol/L (ref 20–32)
Calcium: 9.9 mg/dL (ref 8.6–10.3)
Chloride: 102 mmol/L (ref 98–110)
Creat: 1.34 mg/dL — ABNORMAL HIGH (ref 0.70–1.25)
GFR, Est African American: 66 mL/min/{1.73_m2} (ref >=60)
GFR, Est Non African American: 57 mL/min/{1.73_m2} — ABNORMAL LOW (ref >=60)
Globulin: 3.2 g/dL (calc) (ref 1.9–3.7)
Glucose, Bld: 124 mg/dL — ABNORMAL HIGH (ref 65–99)
Potassium: 4.1 mmol/L (ref 3.5–5.3)
Sodium: 139 mmol/L (ref 135–146)
Total Bilirubin: 0.5 mg/dL (ref 0.2–1.2)
Total Protein: 7.6 g/dL (ref 6.1–8.1)

## 2019-02-20 LAB — HIV-1 RNA QUANT-NO REFLEX-BLD
HIV 1 RNA Quant: <20 copies/mL
HIV-1 RNA Quant, Log: <1.3 Log copies/mL

## 2019-03-02 ENCOUNTER — Telehealth: Payer: Self-pay

## 2019-03-02 ENCOUNTER — Telehealth: Payer: Self-pay | Admitting: Pharmacy Technician

## 2019-03-02 NOTE — Telephone Encounter (Signed)
[facility] Patient Advocate Encounter  Insurance verification completed.    The patient is insured through [place] [employer] and has a $3 copay.  Hendry Speas E. Ileene Allie, CPhT Specialty Pharmacy Patient Adv[facility] Phone: [phone] Fax:  [phone]   

## 2019-03-02 NOTE — Telephone Encounter (Signed)
COVID-19 Pre-Screening Questions:03/02/19  Do you currently have a fever (>100 F), chills or unexplained body aches? NO   Are you currently experiencing new cough, shortness of breath, sore throat, runny nose? NO .  Have you recently travelled outside the state of [place] in the last 14 days? NO .  Have you been in contact with someone that is currently pending confirmation of Covid19 testing or has been confirmed to have the Covid19 virus?  NO   **If the patient answers NO to ALL questions -  advise the patient to please call the clinic before coming to the office should any symptoms develop.     

## 2019-03-03 ENCOUNTER — Ambulatory Visit (INDEPENDENT_AMBULATORY_CARE_PROVIDER_SITE_OTHER): Payer: Medicaid Other | Admitting: Family

## 2019-03-03 ENCOUNTER — Encounter: Payer: Self-pay | Admitting: Family

## 2019-03-03 ENCOUNTER — Other Ambulatory Visit: Payer: Self-pay

## 2019-03-03 ENCOUNTER — Ambulatory Visit (INDEPENDENT_AMBULATORY_CARE_PROVIDER_SITE_OTHER): Payer: Medicaid Other | Admitting: Pharmacist

## 2019-03-03 VITALS — BP 129/88 | HR 93 | Wt 255.0 lb

## 2019-03-03 DIAGNOSIS — I10 Essential (primary) hypertension: Secondary | ICD-10-CM

## 2019-03-03 DIAGNOSIS — N400 Enlarged prostate without lower urinary tract symptoms: Secondary | ICD-10-CM

## 2019-03-03 DIAGNOSIS — R7989 Other specified abnormal findings of blood chemistry: Secondary | ICD-10-CM | POA: Diagnosis not present

## 2019-03-03 DIAGNOSIS — B20 Human immunodeficiency virus [HIV] disease: Secondary | ICD-10-CM

## 2019-03-03 MED ORDER — HYDROCHLOROTHIAZIDE 50 MG PO TABS: 50.0000 mg | ORAL_TABLET | Freq: Every day | ORAL | 3 refills | Status: DC

## 2019-03-03 MED ORDER — OMEPRAZOLE 20 MG PO CPDR: 20.0000 mg | DELAYED_RELEASE_CAPSULE | Freq: Every day | ORAL | 3 refills | Status: DC

## 2019-03-03 MED ORDER — TIVICAY 50 MG PO TABS: 50.0000 mg | ORAL_TABLET | Freq: Every day | ORAL | 5 refills | Status: DC

## 2019-03-03 MED ORDER — EMTRICITABINE-TENOFOVIR AF 200-25 MG PO TABS: 1.0000 | ORAL_TABLET | Freq: Every day | ORAL | 5 refills | Status: DC

## 2019-03-03 NOTE — Assessment & Plan Note (Signed)
Previously diagnosed with enlarged prostate while in [place] and continues to have symptoms which are adequately controlled with current dose of tamsulosin.  May need referral to urology and will work to establish in primary care.  In the meantime, continue tamsulosin.

## 2019-03-03 NOTE — Assessment & Plan Note (Signed)
Hypertension well controlled with current medication regimen of amlodipine and hydrochlorothiazide.  No red flags/warning symptoms of headache or blurred vision.  Encouraged to monitor blood pressure at home as able.  Continue current dose of hydrochlorothiazide and amlodipine.

## 2019-03-03 NOTE — Assessment & Plan Note (Signed)
Chad Little continues to have well-controlled HIV disease with good adherence and tolerance to his ART regimen of Tivicay and Descovy.  His renal function is slightly elevated at 1.34 which may be related to other causes as well.  No signs/symptoms of opportunistic infection or progressive HIV disease.  We reviewed his lab work and discussed the plan of care with time for questions.  Continue current dose of Tivicay and Descovy.  Plan for follow-up in 3 months or sooner if needed with lab work on the same day or 1 to 2 weeks prior to appointment.

## 2019-03-03 NOTE — Progress Notes (Signed)
Subjective:    Patient ID: Chad Little, male    DOB: April 19, 1960, 60 y.o.   MRN: [id number]  Chief Complaint  Patient presents with  . New Patient (Initial Visit)    return to care; no complaints    HPI:  Chad Little is a 59 y.o. male with HIV disease who was last seen in the office in November 2016 for routine follow-up and has since been in [place] now returning to care.  Most recent blood work shows a viral load is undetectable and CD4 count of 647.  Creatinine noted to be 1.34.  Hepatic function and electrolytes within normal ranges.  Chad Little has been living in the [place] area for the last several years and at the care of infectious disease continuing to take his Tivicay and Descovy as prescribed with no adverse side effects.  He has now moved back to the [place] area which is a positive move for him.  Overall feeling well today with no new concerns/complaints. Denies fevers, chills, night sweats, headaches, changes in vision, neck pain/stiffness, nausea, diarrhea, vomiting, lesions or rashes.  Chad Little has no problems obtaining his medication from the pharmacy and remains covered through [facility].  Denies feelings of being down, depressed, or hopeless recently.  No recreational or illicit drug use, tobacco use, or alcohol consumption.  Not currently sexually active.  Had a dental screening/cleaning within the last month.He is currently working part time and for himself.   No Known Allergies    Outpatient Medications Prior to Visit  Medication Sig Dispense Refill  . allopurinol (ZYLOPRIM) 300 MG tablet Take 1 tablet (300 mg total) by mouth daily. 30 tablet 5  . amlodipine-atorvastatin (CADUET) 10-10 MG tablet Take 1 tablet by mouth daily.    . meloxicam (MOBIC) 15 MG tablet Take 15 mg by mouth daily.    . Multiple Vitamin (MULTIVITAMIN WITH MINERALS) TABS Take 1 tablet by mouth daily.    . tamsulosin (FLOMAX) 0.4 MG CAPS capsule Take 0.4 mg by mouth.      . dolutegravir (TIVICAY) 50 MG tablet Take 1 tablet (50 mg total) by mouth daily. 30 tablet 11  . emtricitabine-tenofovir AF (DESCOVY) 200-25 MG tablet Take 1 tablet by mouth daily. 30 tablet 11  . hydrochlorothiazide (HYDRODIURIL) 50 MG tablet Take 50 mg by mouth daily.    [name] omeprazole (PRILOSEC) 20 MG capsule Take 20 mg by mouth daily.    [name] acetaminophen (TYLENOL) 500 MG tablet Take 1 tablet (500 mg total) by mouth every 6 (six) hours as needed. For pain (Patient not taking: Reported on 03/03/2019) 30 tablet 0  . indomethacin (INDOCIN) 50 MG capsule Take 1 capsule (50 mg total) by mouth 3 (three) times daily as needed. (Patient not taking: Reported on 03/03/2019) 30 capsule 4  . traZODone (DESYREL) 50 MG tablet Take 0.5 tablets (25 mg total) by mouth at bedtime as needed for sleep. (Patient not taking: Reported on 03/03/2019) 30 tablet 1   No facility-administered medications prior to visit.     Past Medical History:  Diagnosis Date  . Enlarged prostate   . Gout   . HIV infection ([place])   . Prediabetes       Past Surgical History:  Procedure Laterality Date  . Right elbow surgery        Family History  Problem Relation Age of Onset  . Alcohol abuse Mother   . Hypertension Father   . Stroke Father  Social History   Socioeconomic History  . Marital status: Single    Spouse name: Not on file  . Number of children: Not on file  . Years of education: Not on file  . Highest education level: Not on file  Occupational History  . Not on file  Tobacco Use  . Smoking status: Never Smoker  . Smokeless tobacco: Never Used  Substance and Sexual Activity  . Alcohol use: Not Currently    Alcohol/week: 0.0 standard drinks  . Drug use: No  . Sexual activity: Not Currently    Comment: refused  condoms  Other Topics Concern  . Not on file  Social History Narrative  . Not on file   Social Determinants of Health   Financial Resource Strain:   . Difficulty of Paying  Living Expenses: Not on file  Food Insecurity:   . Worried About Charity fundraiser in the Last Year: Not on file  . Ran Out of Food in the Last Year: Not on file  Transportation Needs:   . Lack of Transportation (Medical): Not on file  . Lack of Transportation (Non-Medical): Not on file  Physical Activity:   . Days of Exercise per Week: Not on file  . Minutes of Exercise per Session: Not on file  Stress:   . Feeling of Stress : Not on file  Social Connections:   . Frequency of Communication with Friends and Family: Not on file  . Frequency of Social Gatherings with Friends and Family: Not on file  . Attends Religious Services: Not on file  . Active Member of Clubs or Organizations: Not on file  . Attends Archivist Meetings: Not on file  . Marital Status: Not on file  Intimate Partner Violence:   . Fear of Current or Ex-Partner: Not on file  . Emotionally Abused: Not on file  . Physically Abused: Not on file  . Sexually Abused: Not on file     Review of Systems  Constitutional: Negative for appetite change, chills, fatigue, fever and unexpected weight change.  Eyes: Negative for visual disturbance.  Respiratory: Negative for cough, chest tightness, shortness of breath and wheezing.   Cardiovascular: Negative for chest pain and leg swelling.  Gastrointestinal: Negative for abdominal pain, constipation, diarrhea, nausea and vomiting.  Genitourinary: Negative for dysuria, flank pain, frequency, genital sores, hematuria and urgency.  Skin: Negative for rash.  Allergic/Immunologic: Negative for immunocompromised state.  Neurological: Negative for dizziness and headaches.       Objective:    BP 129/88   Pulse 93   Wt 255 lb (115.7 kg)   BMI 30.24 kg/m  Nursing note and vital signs reviewed.  Physical Exam Constitutional:      General: He is not in acute distress.    Appearance: He is well-developed.  Eyes:     Conjunctiva/sclera: Conjunctivae normal.    Cardiovascular:     Rate and Rhythm: Normal rate and regular rhythm.     Heart sounds: Normal heart sounds. No murmur. No friction rub. No gallop.   Pulmonary:     Effort: Pulmonary effort is normal. No respiratory distress.     Breath sounds: Normal breath sounds. No wheezing or rales.  Chest:     Chest wall: No tenderness.  Abdominal:     General: Bowel sounds are normal.     Palpations: Abdomen is soft.     Tenderness: There is no abdominal tenderness.  Musculoskeletal:     Cervical back: Neck supple.  Lymphadenopathy:     Cervical: No cervical adenopathy.  Skin:    General: Skin is warm and dry.     Findings: No rash.  Neurological:     Mental Status: He is alert and oriented to person, place, and time.  Psychiatric:        Behavior: Behavior normal.        Thought Content: Thought content normal.        Judgment: Judgment normal.        Assessment & Plan:   Patient Active Problem List   Diagnosis Date Noted  . Enlarged prostate 03/03/2019  . Arthralgia of multiple sites, bilateral 11/16/2014  . Gouty arthritis 10/31/2014  . Hypertension 10/31/2014  . Elevated serum creatinine 10/31/2014  . Onychomycosis 12/11/2011  . Gout 11/28/2008  . DENTAL CARIES 08/22/2008  . ELEVATED BLOOD PRESSURE 08/22/2008  . TOE PAIN 08/04/2008  . HIV disease ([place]) 12/28/2007  . Osteoarthritis 12/28/2007  . INSOMNIA 12/28/2007     Problem List Items Addressed This Visit      Cardiovascular and Mediastinum   Hypertension    Hypertension well controlled with current medication regimen of amlodipine and hydrochlorothiazide.  No red flags/warning symptoms of headache or blurred vision.  Encouraged to monitor blood pressure at home as able.  Continue current dose of hydrochlorothiazide and amlodipine.      Relevant Medications   amlodipine-atorvastatin (CADUET) 10-10 MG tablet   hydrochlorothiazide (HYDRODIURIL) 50 MG tablet     Other   HIV disease ([place]) - Primary    Chad Little  continues to have well-controlled HIV disease with good adherence and tolerance to his ART regimen of Tivicay and Descovy.  His renal function is slightly elevated at 1.34 which may be related to other causes as well.  No signs/symptoms of opportunistic infection or progressive HIV disease.  We reviewed his lab work and discussed the plan of care with time for questions.  Continue current dose of Tivicay and Descovy.  Plan for follow-up in 3 months or sooner if needed with lab work on the same day or 1 to 2 weeks prior to appointment.      Relevant Medications   emtricitabine-tenofovir AF (DESCOVY) 200-25 MG tablet   dolutegravir (TIVICAY) 50 MG tablet   Elevated serum creatinine    Creatinine of 1.34 which appears to be stable.  He does have several medications concerning for renal insult including meloxicam and cidofovir.  Continue to monitor renal function periodically.      Enlarged prostate    Previously diagnosed with enlarged prostate while in [place] and continues to have symptoms which are adequately controlled with current dose of tamsulosin.  May need referral to urology and will work to establish in primary care.  In the meantime, continue tamsulosin.          I have discontinued Chad Little. Chad Little's indomethacin, acetaminophen, and traZODone. I have also changed his omeprazole and hydrochlorothiazide. Additionally, I am having him maintain his multivitamin with minerals, allopurinol, amlodipine-atorvastatin, meloxicam, tamsulosin, emtricitabine-tenofovir AF, and Tivicay.   Meds ordered this encounter  Medications  . omeprazole (PRILOSEC) 20 MG capsule    Sig: Take 1 capsule (20 mg total) by mouth daily.    Dispense:  30 capsule    Refill:  3    Order Specific Question:   Supervising Provider    Answer:   Carlyle Basques [[id number]]  . hydrochlorothiazide (HYDRODIURIL) 50 MG tablet    Sig: Take 1 tablet (50 mg total) by mouth  daily.    Dispense:  30 tablet    Refill:  3     Order Specific Question:   Supervising Provider    Answer:   Carlyle Basques [[id number]]  . emtricitabine-tenofovir AF (DESCOVY) 200-25 MG tablet    Sig: Take 1 tablet by mouth daily.    Dispense:  30 tablet    Refill:  5    Order Specific Question:   Supervising Provider    Answer:   Carlyle Basques [[id number]]  . dolutegravir (TIVICAY) 50 MG tablet    Sig: Take 1 tablet (50 mg total) by mouth daily.    Dispense:  30 tablet    Refill:  5    Order Specific Question:   Supervising Provider    Answer:   Carlyle Basques [[id number]]     Follow-up: Return in about 3 months (around 06/01/2019), or if symptoms worsen or fail to improve.    Terri Piedra, MSN, FNP-C Nurse Practitioner [facility] for Infectious Disease [place] number: [phone]

## 2019-03-03 NOTE — Patient Instructions (Signed)
Nice to see you.  We will continue your Tivicay and Descovy daily.  Refills will be sent to the pharmacy.  Plan for follow up office visit in 3 months or sooner if needed with lab work 1-2 prior to appointment.

## 2019-03-03 NOTE — Assessment & Plan Note (Signed)
Creatinine of 1.34 which appears to be stable.  He does have several medications concerning for renal insult including meloxicam and cidofovir.  Continue to monitor renal function periodically.

## 2019-03-05 NOTE — Progress Notes (Signed)
Did not get to meet with patient today. Will meet with him at his next follow up appointment.

## 2019-04-02 ENCOUNTER — Telehealth: Payer: Self-pay | Admitting: *Deleted

## 2019-04-02 NOTE — Telephone Encounter (Signed)
Chad Little called to see if he could switch his tamsulosin to the morning instead of at night. He has been experiencing increased nocturia, is hoping that he will get better rest by taking it in the morning. RN advised patient to watch for side effects of dizziness, to change positions slowly if he has been sitting a while, and to take tamsulosin about 30 minutes after a meal.  He takes his vitamin, acid reflux medication, and hypertension medications in the morning as well. Landis Gandy, RN

## 2019-04-06 NOTE — Telephone Encounter (Signed)
Agree with recommendations. Thanks!

## 2019-04-14 ENCOUNTER — Ambulatory Visit: Payer: Medicaid Other | Admitting: Internal Medicine

## 2019-04-14 VITALS — BP 140/100 | HR 86 | Temp 98.4°F | Ht 77.0 in | Wt 252.6 lb

## 2019-04-14 DIAGNOSIS — R7303 Prediabetes: Secondary | ICD-10-CM | POA: Insufficient documentation

## 2019-04-14 DIAGNOSIS — Z79899 Other long term (current) drug therapy: Secondary | ICD-10-CM

## 2019-04-14 DIAGNOSIS — M1000 Idiopathic gout, unspecified site: Secondary | ICD-10-CM

## 2019-04-14 DIAGNOSIS — N400 Enlarged prostate without lower urinary tract symptoms: Secondary | ICD-10-CM

## 2019-04-14 DIAGNOSIS — M109 Gout, unspecified: Secondary | ICD-10-CM | POA: Diagnosis not present

## 2019-04-14 DIAGNOSIS — R7989 Other specified abnormal findings of blood chemistry: Secondary | ICD-10-CM | POA: Diagnosis present

## 2019-04-14 DIAGNOSIS — M1990 Unspecified osteoarthritis, unspecified site: Secondary | ICD-10-CM | POA: Diagnosis not present

## 2019-04-14 DIAGNOSIS — Z21 Asymptomatic human immunodeficiency virus [HIV] infection status: Secondary | ICD-10-CM

## 2019-04-14 DIAGNOSIS — I10 Essential (primary) hypertension: Secondary | ICD-10-CM | POA: Diagnosis not present

## 2019-04-14 NOTE — Progress Notes (Signed)
ROI completed / signed - will fax to [facility] for records.

## 2019-04-14 NOTE — Progress Notes (Signed)
Internal Medicine Clinic Attending  Case discussed with Dr. MacLean at the time of the visit.  We reviewed the resident's history and exam and pertinent patient test results.  I agree with the assessment, diagnosis, and plan of care documented in the resident's note.  Kimblery Diop, M.D., Ph.D.  

## 2019-04-14 NOTE — Assessment & Plan Note (Signed)
Patient reports his symptoms are improved with tamsulosin.  He states this medication does make him fatigued and make him urinate more.  Patient plans to take medication in the evening and restrict water intake in the evening.

## 2019-04-14 NOTE — Patient Instructions (Addendum)
You were seen in the clinic to establish care. You are doing a great job with your health, keep it up! Here are my recommendations:  1) We are checking blood work today to check on your renal function and electrolytes. We will call you with the results  2) Please check your blood pressure at home every day and record the values. Please bring this log and your home blood pressure cuff to your next appointment  3) We have sent paperwork to get records from your primary care doctor   4) Continue taking your tamsulosin close to bedtime, and try to limit your water intake at the end of the day to prevent you from having to urinate as much overnight.   5) We have not made any changes to your medications. Please bring your medication bottles to your next appointment  Thank you for allowing [place] to be part of your medical care!

## 2019-04-14 NOTE — Assessment & Plan Note (Addendum)
Patient reports that he no longer takes allopurinol, has not had a gout attack in 2 years, has implemented dietary changes including avoiding red meats and seafood.

## 2019-04-14 NOTE — Assessment & Plan Note (Signed)
Patient reports he was told by his primary care provider about 3-4 months ago in [place] that he has prediabetes.  Patient reports that he has implemented lifestyle changes and had a repeat test done on Monday.  Plan: *Records request sent to PCP office

## 2019-04-14 NOTE — Assessment & Plan Note (Addendum)
Review last encounter, patient had a creatinine of 1.34.  It was recommended at last encounter and patient discontinue meloxicam.  *We will recheck BMP today. Patient consented that these results could be left on phone voicemail if he does not answer

## 2019-04-14 NOTE — Progress Notes (Signed)
   CC: Establish care  HPI: Patient is a 59 year old male with past medical history significant for hypertension, gouty arthritis, osteoarthritis, HIV infection who presents to establish care.  Mr.Kenaz Jerilynn Mages Sparr is a 59 y.o.   Past Medical History: * Enlarged prostate * Gout * HIV Infection * Pre-diabetes * Essential hypertension  Review of Systems:   Review of Systems  Respiratory: Negative for shortness of breath.   Cardiovascular: Negative for chest pain.  All other systems reviewed and are negative.  Allergies: Denies known allergies to medications  Family History: *Type 2 diabetes mellitus in several family members including sister *Gout in grandmother *Father with stroke  Social: *Denies past or current tobacco usage *Denies current alcohol usage, denies past heavy alcohol usage *Reports a past history of cocaine usage, denies current substance usage  Physical Exam:  Vitals:   04/14/19 0842 04/14/19 0925  BP: (!) 144/96 (!) 140/100  Pulse: 88 86  Temp: 98.4 F (36.9 C)   TempSrc: Oral   SpO2: 100%   Weight: 252 lb 9.6 oz (114.6 kg)   Height: 6\' 5"  (1.956 m)    Physical Exam  Constitutional: He is well-developed, well-nourished, and in no distress.  HENT:  Head: Normocephalic and atraumatic.  Eyes: EOM are normal. Right eye exhibits no discharge. Left eye exhibits no discharge.  Neck: No tracheal deviation present.  Cardiovascular: Normal rate and regular rhythm. Exam reveals no gallop and no friction rub.  No murmur heard. Pulmonary/Chest: Effort normal and breath sounds normal. No respiratory distress. He has no wheezes. He has no rales.  Abdominal: Soft. He exhibits no distension. There is no abdominal tenderness. There is no rebound and no guarding.  Musculoskeletal:        General: No tenderness, deformity or edema. Normal range of motion.     Cervical back: Normal range of motion.  Neurological: He is alert. Coordination normal.  Skin: Skin  is warm and dry. No rash noted. He is not diaphoretic. No erythema.  Psychiatric: Memory and judgment normal.    Assessment & Plan:   See Encounters Tab for problem based charting.  Patient discussed with Dr. Rebeca Alert

## 2019-04-14 NOTE — Assessment & Plan Note (Signed)
Blood pressure 144/90, 140/100 on recheck.  Hypertension is asymptomatic.  Encourage patient to measure blood pressure at home, record the values, bring log to next visit.  Patient reports that he stopped taking hydrochlorothiazide after starting tamsulosin.  *Continue amlodipine 10 mg daily *Reassess blood pressure at follow-up.  If patient remains hypertensive, can consider reinitiating hydrochlorothiazide.

## 2019-04-15 LAB — BMP8+ANION GAP
Anion Gap: 16 mmol/L (ref 10.0–18.0)
BUN/Creatinine Ratio: 11 (ref 10–24)
BUN: 13 mg/dL (ref 8–27)
CO2: 21 mmol/L (ref 20–29)
Calcium: 9.5 mg/dL (ref 8.6–10.2)
Chloride: 102 mmol/L (ref 96–106)
Creatinine, Ser: 1.18 mg/dL (ref 0.76–1.27)
GFR calc Af Amer: 77 mL/min/{1.73_m2} (ref >59)
GFR calc non Af Amer: 67 mL/min/{1.73_m2} (ref >59)
Glucose: 101 mg/dL — ABNORMAL HIGH (ref 65–99)
Potassium: 4.4 mmol/L (ref 3.5–5.2)
Sodium: 139 mmol/L (ref 134–144)

## 2019-04-16 ENCOUNTER — Other Ambulatory Visit: Payer: Self-pay

## 2019-04-16 ENCOUNTER — Telehealth: Payer: Self-pay

## 2019-04-16 MED ORDER — AMLODIPINE-ATORVASTATIN 10-10 MG PO TABS: 1.0000 | ORAL_TABLET | Freq: Every day | ORAL | 3 refills | Status: DC

## 2019-04-16 NOTE — Telephone Encounter (Signed)
Prior authorization initiated on patients amlodipine-atorvastatin prescription. Awaiting response.   Yechiel Erny Lorita Officer, RN

## 2019-04-16 NOTE — Telephone Encounter (Signed)
Patient stated his blood pressure medication was not sent to pharmacy .   I will contact the pharmacy and send his [place].    Laverle Patter, RN

## 2019-04-21 ENCOUNTER — Telehealth: Payer: Self-pay

## 2019-04-21 NOTE — Telephone Encounter (Signed)
Requesting to speak with Dr. Steen, please call pt back.  

## 2019-04-21 NOTE — Telephone Encounter (Signed)
RTC to patient, notified Dr. Court Joy would not be in office until Monday and offered assistance.  Pt states he had a conversation with Dr. Court Joy regarding his amlodipine.  He was previously taking amlodipine/caudet from prior pcp.  States Dr. Court Joy "told me it was ok to just take amlodipine".  Pt received a RX for amlodipine-caudet from [facility] last month.  Wants to ask Dr. Court Joy questions r/t their previous conversations.  States he can call him some time next week.   As of note, per chart review, pt has only seen Dr. Benjamine Mola in [facility] on 04/14/19, will also forward to Dr. Benjamine Mola. Thank you, SChaplin, RN,BSN

## 2019-04-21 NOTE — Telephone Encounter (Signed)
Patient previously talked with me. I will call and discuss with patient. Thanks!

## 2019-05-03 ENCOUNTER — Telehealth: Payer: Self-pay | Admitting: *Deleted

## 2019-05-03 DIAGNOSIS — R351 Nocturia: Secondary | ICD-10-CM

## 2019-05-03 NOTE — Telephone Encounter (Signed)
Notified patient. Thanks! 

## 2019-05-03 NOTE — Addendum Note (Signed)
Addended by: Mauricio Po D on: 05/03/2019 11:23 AM   Modules accepted: Orders

## 2019-05-03 NOTE — Telephone Encounter (Signed)
Patient is still having multiple episodes of nocturia despite switching his tamsulosin to the morning. He would like to know if Marya Amsler can add labs to check his prostate and a1c for his upcoming lab draw 4/5. He sees primary 4/14, Marya Amsler 4/20).  Please advise if ok to add labs patient is requesting. Landis Gandy, RN

## 2019-05-03 NOTE — Telephone Encounter (Signed)
Additional lab work added.

## 2019-05-10 ENCOUNTER — Other Ambulatory Visit (HOSPITAL_COMMUNITY)
Admission: RE | Admit: 2019-05-10 | Discharge: 2019-05-10 | Disposition: A | Discharge status: Home / Self Care | Payer: Medicaid Other | Source: Ambulatory Visit | Attending: Family | Admitting: Family

## 2019-05-10 ENCOUNTER — Other Ambulatory Visit: Payer: Self-pay

## 2019-05-10 ENCOUNTER — Other Ambulatory Visit: Payer: Medicaid Other

## 2019-05-10 DIAGNOSIS — Z113 Encounter for screening for infections with a predominantly sexual mode of transmission: Secondary | ICD-10-CM

## 2019-05-10 DIAGNOSIS — R351 Nocturia: Secondary | ICD-10-CM | POA: Diagnosis present

## 2019-05-10 DIAGNOSIS — I10 Essential (primary) hypertension: Secondary | ICD-10-CM

## 2019-05-10 DIAGNOSIS — B20 Human immunodeficiency virus [HIV] disease: Secondary | ICD-10-CM

## 2019-05-10 DIAGNOSIS — Z79899 Other long term (current) drug therapy: Secondary | ICD-10-CM

## 2019-05-11 LAB — T-HELPER CELL (CD4) - (RCID CLINIC ONLY)
CD4 % Helper T Cell: 35 % (ref 33–65)
CD4 T Cell Abs: 628 /uL (ref 400–1790)

## 2019-05-11 LAB — URINE CYTOLOGY ANCILLARY ONLY
Chlamydia: NEGATIVE
Comment: NEGATIVE
Comment: NORMAL
Neisseria Gonorrhea: NEGATIVE

## 2019-05-12 LAB — LIPID PANEL
Cholesterol: 124 mg/dL (ref <200)
HDL: 54 mg/dL (ref >=40)
LDL Cholesterol (Calc): 61 mg/dL (calc)
Non-HDL Cholesterol (Calc): 70 mg/dL (calc) (ref <130)
Total CHOL/HDL Ratio: 2.3 (calc) (ref <5.0)
Triglycerides: 31 mg/dL (ref <150)

## 2019-05-12 LAB — COMPREHENSIVE METABOLIC PANEL
AG Ratio: 1.5 (calc) (ref 1.0–2.5)
ALT: 27 U/L (ref 9–46)
AST: 32 U/L (ref 10–35)
Albumin: 4.6 g/dL (ref 3.6–5.1)
Alkaline phosphatase (APISO): 96 U/L (ref 35–144)
BUN: 18 mg/dL (ref 7–25)
CO2: 25 mmol/L (ref 20–32)
Calcium: 9.9 mg/dL (ref 8.6–10.3)
Chloride: 106 mmol/L (ref 98–110)
Creat: 1.17 mg/dL (ref 0.70–1.25)
Globulin: 3.1 g/dL (calc) (ref 1.9–3.7)
Glucose, Bld: 102 mg/dL — ABNORMAL HIGH (ref 65–99)
Potassium: 4.2 mmol/L (ref 3.5–5.3)
Sodium: 139 mmol/L (ref 135–146)
Total Bilirubin: 0.6 mg/dL (ref 0.2–1.2)
Total Protein: 7.7 g/dL (ref 6.1–8.1)

## 2019-05-12 LAB — CBC WITH DIFFERENTIAL/PLATELET
Absolute Monocytes: 748 cells/uL (ref 200–950)
Basophils Absolute: 59 cells/uL (ref 0–200)
Basophils Relative: 0.9 %
Eosinophils Absolute: 299 cells/uL (ref 15–500)
Eosinophils Relative: 4.6 %
HCT: 47.7 % (ref 38.5–50.0)
Hemoglobin: 16.5 g/dL (ref 13.2–17.1)
Lymphs Abs: 1898 cells/uL (ref 850–3900)
MCH: 32.5 pg (ref 27.0–33.0)
MCHC: 34.6 g/dL (ref 32.0–36.0)
MCV: 94.1 fL (ref 80.0–100.0)
MPV: 9.7 fL (ref 7.5–12.5)
Monocytes Relative: 11.5 %
Neutro Abs: 3497 cells/uL (ref 1500–7800)
Neutrophils Relative %: 53.8 %
Platelets: 279 10*3/uL (ref 140–400)
RBC: 5.07 10*6/uL (ref 4.20–5.80)
RDW: 13 % (ref 11.0–15.0)
Total Lymphocyte: 29.2 %
WBC: 6.5 10*3/uL (ref 3.8–10.8)

## 2019-05-12 LAB — PSA: PSA: 1.3 ng/mL (ref <=4.0)

## 2019-05-12 LAB — URINALYSIS, ROUTINE W REFLEX MICROSCOPIC
Bilirubin Urine: NEGATIVE
Glucose, UA: NEGATIVE
Hgb urine dipstick: NEGATIVE
Ketones, ur: NEGATIVE
Leukocytes,Ua: NEGATIVE
Nitrite: NEGATIVE
Protein, ur: NEGATIVE
Specific Gravity, Urine: 1.008 (ref 1.001–1.03)
pH: 6 (ref 5.0–8.0)

## 2019-05-12 LAB — HEMOGLOBIN A1C
Hgb A1c MFr Bld: 5.7 % of total Hgb — ABNORMAL HIGH (ref <5.7)
Mean Plasma Glucose: 117 (calc)
eAG (mmol/L): 6.5 (calc)

## 2019-05-12 LAB — HIV-1 RNA QUANT-NO REFLEX-BLD
HIV 1 RNA Quant: 65 copies/mL — ABNORMAL HIGH
HIV-1 RNA Quant, Log: 1.81 Log copies/mL — ABNORMAL HIGH

## 2019-05-12 LAB — RPR: RPR Ser Ql: NONREACTIVE

## 2019-05-19 ENCOUNTER — Encounter: Payer: Medicaid Other | Admitting: Internal Medicine

## 2019-05-25 ENCOUNTER — Ambulatory Visit (INDEPENDENT_AMBULATORY_CARE_PROVIDER_SITE_OTHER): Payer: Medicaid Other | Admitting: Family

## 2019-05-25 ENCOUNTER — Other Ambulatory Visit: Payer: Self-pay

## 2019-05-25 ENCOUNTER — Encounter: Payer: Self-pay | Admitting: Family

## 2019-05-25 VITALS — BP 125/78 | HR 88 | Temp 98.0°F | Ht 77.0 in | Wt 249.2 lb

## 2019-05-25 DIAGNOSIS — Z113 Encounter for screening for infections with a predominantly sexual mode of transmission: Secondary | ICD-10-CM

## 2019-05-25 DIAGNOSIS — R7303 Prediabetes: Secondary | ICD-10-CM

## 2019-05-25 DIAGNOSIS — B20 Human immunodeficiency virus [HIV] disease: Secondary | ICD-10-CM | POA: Diagnosis not present

## 2019-05-25 DIAGNOSIS — Z0000 Encounter for general adult medical examination without abnormal findings: Secondary | ICD-10-CM | POA: Insufficient documentation

## 2019-05-25 MED ORDER — TIVICAY 50 MG PO TABS: 50.0000 mg | ORAL_TABLET | Freq: Every day | ORAL | 5 refills | Status: DC

## 2019-05-25 MED ORDER — EMTRICITABINE-TENOFOVIR AF 200-25 MG PO TABS: 1.0000 | ORAL_TABLET | Freq: Every day | ORAL | 5 refills | Status: DC

## 2019-05-25 NOTE — Assessment & Plan Note (Signed)
Chad Little has prediabetes with an hemoglobin A1c of 5.7.  No medication indicated at the present time.  We discussed dietary interventions to reduce blood sugar and prevent progression of insulin resistance.  He has upcoming appointment with primary care.

## 2019-05-25 NOTE — Patient Instructions (Addendum)
Nice to see you.  We will continue your Tivicay and Descovy daily.  Refills will be sent to the pharmacy.  We will plan to follow up in 4 months or sooner if needed with lab work 1-2 weeks prior to appointment.   Have a great day and stay safe!

## 2019-05-25 NOTE — Assessment & Plan Note (Signed)
   Completed Covid vaccinations.  Discussed importance of safe sexual practice to reduce risk of STI.  Condoms declined.

## 2019-05-25 NOTE — Assessment & Plan Note (Signed)
Chad Little has well-controlled HIV disease with good adherence and tolerance to his ART regimen of Tivicay and Descovy.  No signs/symptoms of opportunistic infection or progressive HIV disease.  We reviewed his lab work and discussed the plan of care.  He has no problems obtaining his medication from the pharmacy.  Continue current dose of Tivicay and Descovy.  Plan for follow-up in 4 months or sooner if needed with lab work 1 to 2 weeks prior to appointment.

## 2019-05-25 NOTE — Progress Notes (Signed)
Subjective:    Patient ID: Chad Little, male    DOB: [birth year], 59 y.o.   MRN: [id number]  Chief Complaint  Patient presents with  . Follow-up    3 month follow up     HPI:  Chad Little is a 59 y.o. male with HIV disease who was last seen in the office on 03/03/2019 with good adherence and tolerance to his ART regimen of Tivicay and Descovy.  Lab work showed a viral load that was undetectable with CD4 count of 647.  Most recent blood work completed on 05/10/2019 with CD4 count of 628 and a viral load of 65.  Hemoglobin A1c of 5.7 indicating prediabetes and PSA normal.  Chad Little continues to take his Tivicay and Descovy as prescribed with no adverse side effects or missed doses since his last office visit.  Overall feeling well today with no new concerns/complaints.  He is curious as to lower his A1c to prevent from the development of diabetes. Denies fevers, chills, night sweats, headaches, changes in vision, neck pain/stiffness, nausea, diarrhea, vomiting, lesions or rashes.  Chad Little has no problems obtaining his medication from the pharmacy and remains covered through [facility].  Denies feelings of being down, depressed, or hopeless recently.  No recreational or illicit drug use, tobacco use, or alcohol consumption.  Declines condoms today.  Recently received his Covid vaccination series.  No Known Allergies    Outpatient Medications Prior to Visit  Medication Sig Dispense Refill  . amlodipine-atorvastatin (CADUET) 10-10 MG tablet Take 1 tablet by mouth daily. 30 tablet 3  . Multiple Vitamin (MULTIVITAMIN WITH MINERALS) TABS Take 1 tablet by mouth daily.    [name] omeprazole (PRILOSEC) 20 MG capsule Take 1 capsule (20 mg total) by mouth daily. 30 capsule 3  . tamsulosin (FLOMAX) 0.4 MG CAPS capsule Take 0.4 mg by mouth.    . dolutegravir (TIVICAY) 50 MG tablet Take 1 tablet (50 mg total) by mouth daily. 30 tablet 5  . emtricitabine-tenofovir AF (DESCOVY) 200-25 MG  tablet Take 1 tablet by mouth daily. 30 tablet 5   No facility-administered medications prior to visit.     Past Medical History:  Diagnosis Date  . Enlarged prostate   . Essential hypertension   . Gout   . HIV infection ([place])   . Prediabetes      Past Surgical History:  Procedure Laterality Date  . Right elbow surgery      Review of Systems  Constitutional: Negative for appetite change, chills, fatigue, fever and unexpected weight change.  Eyes: Negative for visual disturbance.  Respiratory: Negative for cough, chest tightness, shortness of breath and wheezing.   Cardiovascular: Negative for chest pain and leg swelling.  Gastrointestinal: Negative for abdominal pain, constipation, diarrhea, nausea and vomiting.  Genitourinary: Negative for dysuria, flank pain, frequency, genital sores, hematuria and urgency.  Skin: Negative for rash.  Allergic/Immunologic: Negative for immunocompromised state.  Neurological: Negative for dizziness and headaches.      Objective:    BP 125/78 (BP Location: Left Arm, Patient Position: Sitting)   Pulse 88   Temp 98 F (36.7 C)   Ht 6\' 5"  (1.956 m)   Wt 249 lb 3.2 oz (113 kg)   SpO2 99%   BMI 29.55 kg/m  Nursing note and vital signs reviewed.  Physical Exam Constitutional:      General: He is not in acute distress.    Appearance: He is well-developed.  Eyes:     Conjunctiva/sclera: Conjunctivae  normal.  Cardiovascular:     Rate and Rhythm: Normal rate and regular rhythm.     Heart sounds: Normal heart sounds. No murmur. No friction rub. No gallop.   Pulmonary:     Effort: Pulmonary effort is normal. No respiratory distress.     Breath sounds: Normal breath sounds. No wheezing or rales.  Chest:     Chest wall: No tenderness.  Abdominal:     General: Bowel sounds are normal.     Palpations: Abdomen is soft.     Tenderness: There is no abdominal tenderness.  Musculoskeletal:     Cervical back: Neck supple.  Lymphadenopathy:       Cervical: No cervical adenopathy.  Skin:    General: Skin is warm and dry.     Findings: No rash.  Neurological:     Mental Status: He is alert and oriented to person, place, and time.  Psychiatric:        Behavior: Behavior normal.        Thought Content: Thought content normal.        Judgment: Judgment normal.      Depression screen [facility] 2/9 04/14/2019 03/03/2019 12/22/2014 12/13/2014 11/16/2014  Decreased Interest 0 0 0 0 (No Data)  Down, Depressed, Hopeless 0 0 1 0 (No Data)  PHQ - 2 Score 0 0 1 0 -  Altered sleeping 1 - - - -  Tired, decreased energy 0 - - - -  Change in appetite 0 - - - -  Feeling bad or failure about yourself  0 - - - -  Trouble concentrating 0 - - - -  Moving slowly or fidgety/restless 0 - - - -  Suicidal thoughts 0 - - - -  PHQ-9 Score 1 - - - -  Difficult doing work/chores Not difficult at all - - - -       Assessment & Plan:    Patient Active Problem List   Diagnosis Date Noted  . Healthcare maintenance 05/25/2019  . Prediabetes 04/14/2019  . Enlarged prostate 03/03/2019  . Arthralgia of multiple sites, bilateral 11/16/2014  . Gouty arthritis 10/31/2014  . Hypertension 10/31/2014  . Elevated serum creatinine 10/31/2014  . Onychomycosis 12/11/2011  . Gout 11/28/2008  . DENTAL CARIES 08/22/2008  . ELEVATED BLOOD PRESSURE 08/22/2008  . TOE PAIN 08/04/2008  . HIV disease ([place]) 12/28/2007  . Osteoarthritis 12/28/2007  . INSOMNIA 12/28/2007     Problem List Items Addressed This Visit      Other   HIV disease [facility])    Chad Little has well-controlled HIV disease with good adherence and tolerance to his ART regimen of Tivicay and Descovy.  No signs/symptoms of opportunistic infection or progressive HIV disease.  We reviewed his lab work and discussed the plan of care.  He has no problems obtaining his medication from the pharmacy.  Continue current dose of Tivicay and Descovy.  Plan for follow-up in 4 months or sooner if needed with lab work  1 to 2 weeks prior to appointment.      Relevant Medications   emtricitabine-tenofovir AF (DESCOVY) 200-25 MG tablet   dolutegravir (TIVICAY) 50 MG tablet   Other Relevant Orders   HIV-1 RNA quant-no reflex-bld   T-helper cell (CD4)- ([facility] clinic only)   Comprehensive metabolic panel   Prediabetes    Chad Little has prediabetes with an hemoglobin A1c of 5.7.  No medication indicated at the present time.  We discussed dietary interventions to reduce blood sugar and  prevent progression of insulin resistance.  He has upcoming appointment with primary care.      Healthcare maintenance     Completed Covid vaccinations.  Discussed importance of safe sexual practice to reduce risk of STI.  Condoms declined.       Other Visit Diagnoses    Screening for STDs (sexually transmitted diseases)    -  Primary   Relevant Orders   RPR       I am having [name] maintain his multivitamin with minerals, tamsulosin, omeprazole, amlodipine-atorvastatin, emtricitabine-tenofovir AF, and Tivicay.   Meds ordered this encounter  Medications  . emtricitabine-tenofovir AF (DESCOVY) 200-25 MG tablet    Sig: Take 1 tablet by mouth daily.    Dispense:  30 tablet    Refill:  5    Order Specific Question:   Supervising Provider    Answer:   Carlyle Basques [[id number]]  . dolutegravir (TIVICAY) 50 MG tablet    Sig: Take 1 tablet (50 mg total) by mouth daily.    Dispense:  30 tablet    Refill:  5    Order Specific Question:   Supervising Provider    Answer:   Carlyle Basques [[id number]]     Follow-up: Return in about 4 months (around 09/24/2019), or if symptoms worsen or fail to improve.   Terri Piedra, MSN, FNP-C Nurse Practitioner [facility] for Infectious Disease [place] number: [phone]

## 2019-05-26 ENCOUNTER — Other Ambulatory Visit: Payer: Self-pay

## 2019-05-26 ENCOUNTER — Ambulatory Visit: Payer: Medicaid Other | Admitting: Internal Medicine

## 2019-05-26 ENCOUNTER — Encounter: Payer: Self-pay | Admitting: Internal Medicine

## 2019-05-26 VITALS — BP 129/89 | HR 86 | Temp 98.3°F | Ht 77.0 in | Wt 251.0 lb

## 2019-05-26 DIAGNOSIS — Z79899 Other long term (current) drug therapy: Secondary | ICD-10-CM

## 2019-05-26 DIAGNOSIS — I10 Essential (primary) hypertension: Secondary | ICD-10-CM | POA: Diagnosis not present

## 2019-05-26 DIAGNOSIS — N400 Enlarged prostate without lower urinary tract symptoms: Secondary | ICD-10-CM

## 2019-05-26 DIAGNOSIS — R600 Localized edema: Secondary | ICD-10-CM

## 2019-05-26 NOTE — Progress Notes (Signed)
   CC: BPH, HTN  HPI:[name] is a 59 y.o. male who presents for a yearly checkup and discuss his chronic conditions including HTN, BPH, and edema in his ankles. Please see individual problem based A/P for details.   Depression, PHQ-9: Based on the patients    Office Visit from 04/14/2019 in [place]  PHQ-9 Total Score  1     Past Medical History:  Diagnosis Date  . Enlarged prostate   . Essential hypertension   . Gout   . HIV infection ([place])   . Prediabetes    Review of Systems:  ROS negative except as per HPI.  Physical Exam: Vitals:   05/26/19 1430  BP: 129/89  Pulse: 86  Temp: 98.3 F (36.8 C)  TempSrc: Oral  SpO2: 98%  Weight: 251 lb (113.9 kg)  Height: 6\' 5"  (1.956 m)    General: NAD, nl appearance HE: Normocephalic, atraumatic , EOMI, Conjunctivae normal ENT: No congestion, no rhinorrhea, no exudate or erythema  Cardiovascular: Normal rate, regular rhythm.  No murmurs, rubs, or gallops Pulmonary : Effort normal, breath sounds normal. No wheezes, rales, or rhonchi Abdominal: soft, nontender,  bowel sounds present Musculoskeletal: no swelling , deformity, injury ,or tenderness in extremities, Skin: plaques on left leg, see below   t  Assessment & Plan:   See Encounters Tab for problem based charting.  Patient discussed with Dr. Lynnae January

## 2019-05-26 NOTE — Patient Instructions (Addendum)
Thank you for trusting me with your care. To recap, today we discussed the following:  Swelling of your feet after at the end of the day _ I expect this is normal. Let me know if the swelling worsens , you notice it in the mornings, or if you feel short of breath when laying flat  Enlarged prostate - PSA is nl - Continue Flomax  Your other chronic conditions are well controlled. Keep eating health , exercising frequently, and I will see you at our next visit.   My best,   Tamsen Snider, MD

## 2019-05-27 ENCOUNTER — Encounter: Payer: Self-pay | Admitting: Internal Medicine

## 2019-05-27 NOTE — Assessment & Plan Note (Signed)
Patient currently on Tamsulosin and reports it has helped with his symptoms. PSA check recently and in normal limits. Will continue to monitor PSA yearly.

## 2019-05-27 NOTE — Assessment & Plan Note (Signed)
  Hypertension: Patient's BP today is 129/89 with a goal of <140/80. The patient endorses adherence to his medication regimen. He denies, chest pain, headache, visual changes, lightheadedness, weakness, and dizziness on standing. Patient reports swelling in the feet and ankles for over 10 years. He says he notices it after a long day while wearing boots and he notices it when taking off his socks. He denies orthopnea, PND,  or ever having edema in his feet in the mornings. He reports starting Amlodipine around 4 years ago. On exam there are skin changes on left leg, but not sure if this is related. He has no edema on my exam. Will continue to monitor changes, but expect this from local compression.   Plan: Continue amlodipine atorvastatin 10-10 mg

## 2019-05-31 NOTE — Progress Notes (Signed)
Internal Medicine Clinic Attending ° °Case discussed with Dr. Steen  at the time of the visit.  We reviewed the resident’s history and exam and pertinent patient test results.  I agree with the assessment, diagnosis, and plan of care documented in the resident’s note.  °

## 2019-06-25 ENCOUNTER — Other Ambulatory Visit: Payer: Self-pay

## 2019-06-25 MED ORDER — TAMSULOSIN HCL 0.4 MG PO CAPS: 0.4000 mg | ORAL_CAPSULE | Freq: Every day | ORAL | 3 refills | Status: DC

## 2019-06-25 NOTE — Telephone Encounter (Signed)
Question about   tamsulosin (FLOMAX) 0.4 MG CAPS capsule   Please call pt back.

## 2019-06-25 NOTE — Telephone Encounter (Signed)
RTC, pt asking if he is to continue his tamsulosin and if so, states he will need an RX called in. Patient informed that Per Dr. Lonzo Candy last office note on 05/26/19, pt is to continue tamsulosin. Will forward refill request to Dr. Court Joy. Thank you, SChaplin, RN,BSN

## 2019-06-28 ENCOUNTER — Emergency Department (HOSPITAL_COMMUNITY)
Admission: EM | Admit: 2019-06-28 | Discharge: 2019-06-28 | Disposition: A | Discharge status: Home / Self Care | Payer: Medicaid Other | Attending: Emergency Medicine | Admitting: Emergency Medicine

## 2019-06-28 ENCOUNTER — Other Ambulatory Visit: Payer: Self-pay

## 2019-06-28 ENCOUNTER — Emergency Department (HOSPITAL_COMMUNITY): Payer: Medicaid Other

## 2019-06-28 DIAGNOSIS — R1084 Generalized abdominal pain: Secondary | ICD-10-CM | POA: Diagnosis not present

## 2019-06-28 DIAGNOSIS — B20 Human immunodeficiency virus [HIV] disease: Secondary | ICD-10-CM | POA: Insufficient documentation

## 2019-06-28 DIAGNOSIS — I10 Essential (primary) hypertension: Secondary | ICD-10-CM | POA: Insufficient documentation

## 2019-06-28 DIAGNOSIS — Z79899 Other long term (current) drug therapy: Secondary | ICD-10-CM | POA: Diagnosis not present

## 2019-06-28 DIAGNOSIS — R109 Unspecified abdominal pain: Secondary | ICD-10-CM | POA: Diagnosis not present

## 2019-06-28 LAB — CBC
HCT: 49.1 % (ref 39.0–52.0)
Hemoglobin: 16.6 g/dL (ref 13.0–17.0)
MCH: 32.2 pg (ref 26.0–34.0)
MCHC: 33.8 g/dL (ref 30.0–36.0)
MCV: 95.3 fL (ref 80.0–100.0)
Platelets: 304 10*3/uL (ref 150–400)
RBC: 5.15 MIL/uL (ref 4.22–5.81)
RDW: 12.6 % (ref 11.5–15.5)
WBC: 6.9 10*3/uL (ref 4.0–10.5)
nRBC: 0 % (ref 0.0–0.2)

## 2019-06-28 LAB — COMPREHENSIVE METABOLIC PANEL
ALT: 30 U/L (ref 0–44)
AST: 29 U/L (ref 15–41)
Albumin: 4.1 g/dL (ref 3.5–5.0)
Alkaline Phosphatase: 92 U/L (ref 38–126)
Anion gap: 9 (ref 5–15)
BUN: 11 mg/dL (ref 6–20)
CO2: 23 mmol/L (ref 22–32)
Calcium: 9.5 mg/dL (ref 8.9–10.3)
Chloride: 107 mmol/L (ref 98–111)
Creatinine, Ser: 1.17 mg/dL (ref 0.61–1.24)
GFR calc Af Amer: >60 mL/min (ref >60)
GFR calc non Af Amer: >60 mL/min (ref >60)
Glucose, Bld: 98 mg/dL (ref 70–99)
Potassium: 4.2 mmol/L (ref 3.5–5.1)
Sodium: 139 mmol/L (ref 135–145)
Total Bilirubin: 0.4 mg/dL (ref 0.3–1.2)
Total Protein: 7.5 g/dL (ref 6.5–8.1)

## 2019-06-28 LAB — URINALYSIS, ROUTINE W REFLEX MICROSCOPIC
Bilirubin Urine: NEGATIVE
Glucose, UA: NEGATIVE mg/dL
Hgb urine dipstick: NEGATIVE
Ketones, ur: NEGATIVE mg/dL
Leukocytes,Ua: NEGATIVE
Nitrite: NEGATIVE
Protein, ur: NEGATIVE mg/dL
Specific Gravity, Urine: 1.005 (ref 1.005–1.030)
pH: 6 (ref 5.0–8.0)

## 2019-06-28 LAB — LIPASE, BLOOD: Lipase: 46 U/L (ref 11–51)

## 2019-06-28 MED ORDER — ONDANSETRON HCL 4 MG/2ML IJ SOLN
4.0000 mg | Freq: Once | INTRAMUSCULAR | Status: AC
  Administered 2019-06-28: 4 mg via INTRAVENOUS
  Filled 2019-06-28: qty 2

## 2019-06-28 MED ORDER — DICYCLOMINE HCL 20 MG PO TABS
20.0000 mg | ORAL_TABLET | Freq: Two times a day (BID) | ORAL | 0 refills | Status: DC | PRN
Start: 2019-06-28 — End: 2019-10-21

## 2019-06-28 MED ORDER — IOHEXOL 300 MG/ML  SOLN
100.0000 mL | Freq: Once | INTRAMUSCULAR | Status: AC | PRN
  Administered 2019-06-28: 100 mL via INTRAVENOUS

## 2019-06-28 MED ORDER — SODIUM CHLORIDE 0.9% FLUSH: 3.0000 mL | Freq: Once | INTRAVENOUS | Status: DC

## 2019-06-28 MED ORDER — DICYCLOMINE HCL 10 MG PO CAPS
10.0000 mg | ORAL_CAPSULE | Freq: Once | ORAL | Status: AC
  Administered 2019-06-28: 10 mg via ORAL
  Filled 2019-06-28: qty 1

## 2019-06-28 MED ORDER — FAMOTIDINE IN NACL 20-0.9 MG/50ML-% IV SOLN
20.0000 mg | Freq: Once | INTRAVENOUS | Status: AC
  Administered 2019-06-28: 20 mg via INTRAVENOUS
  Filled 2019-06-28: qty 50

## 2019-06-28 NOTE — ED Notes (Signed)
Patient verbalizes understanding of discharge instructions . Opportunity for questions and answers were provided . Armband removed by staff ,Pt discharged from ED. W/C  offered at D/C  and Declined W/C at D/C and was escorted to lobby by RN.  

## 2019-06-28 NOTE — Discharge Instructions (Addendum)
You were seen in the emergency department for evaluation of generalized abdominal pain and spasms.  You had blood work and a CT of your abdomen and pelvis that did not show any serious abnormalities.  We are trying you on some Bentyl or dicyclomine which may help with your spasms.  Drink plenty of fluids.  Follow-up with your doctor or return to the emergency department for any worsening or concerning symptoms

## 2019-06-28 NOTE — ED Provider Notes (Signed)
[facility] [place] EMERGENCY DEPARTMENT Provider Note   CSN: [id number] Arrival date & time: 06/28/19  [id number]     History Chief Complaint  Patient presents with  . Abdominal Pain  . Nausea    Chad Little is a 59 y.o. male.  He has history of HIV and gout.  He says he usually does not eat red meat due to his gout.  He had a hamburger 2 days ago and since then he has felt some generalized abdominal discomfort and nausea with reflux.  No vomiting.  Had a small bowel movement.  No blood from above or below.  Tried Pepto-Bismol without relief.  The history is provided by the patient.  Abdominal Pain Pain location:  Generalized Pain quality: cramping   Pain radiates to:  Does not radiate Pain severity:  Moderate Onset quality:  Gradual Duration:  2 days Timing:  Intermittent Progression:  Unchanged Chronicity:  Recurrent Context: not recent travel, not sick contacts, not suspicious food intake and not trauma   Relieved by:  Nothing Worsened by:  Nothing Ineffective treatments:  OTC medications Associated symptoms: belching, fatigue and nausea   Associated symptoms: no chest pain, no chills, no cough, no diarrhea, no dysuria, no fever, no hematemesis, no hematochezia, no hematuria, no shortness of breath, no sore throat and no vomiting        Past Medical History:  Diagnosis Date  . Enlarged prostate   . Essential hypertension   . Gout   . HIV infection ([place])   . Prediabetes     Patient Active Problem List   Diagnosis Date Noted  . Healthcare maintenance 05/25/2019  . Prediabetes 04/14/2019  . Enlarged prostate 03/03/2019  . Arthralgia of multiple sites, bilateral 11/16/2014  . Gouty arthritis 10/31/2014  . Hypertension 10/31/2014  . Elevated serum creatinine 10/31/2014  . Onychomycosis 12/11/2011  . Gout 11/28/2008  . DENTAL CARIES 08/22/2008  . ELEVATED BLOOD PRESSURE 08/22/2008  . TOE PAIN 08/04/2008  . HIV disease ([place]) 12/28/2007  .  Osteoarthritis 12/28/2007  . INSOMNIA 12/28/2007    Past Surgical History:  Procedure Laterality Date  . Right elbow surgery         Family History  Problem Relation Age of Onset  . Alcohol abuse Mother   . Hypertension Father   . Stroke Father     Social History   Tobacco Use  . Smoking status: Never Smoker  . Smokeless tobacco: Never Used  Substance Use Topics  . Alcohol use: Not Currently    Alcohol/week: 0.0 standard drinks  . Drug use: No    Home Medications Prior to Admission medications   Medication Sig Start Date End Date Taking? Authorizing Provider  amlodipine-atorvastatin (CADUET) 10-10 MG tablet Take 1 tablet by mouth daily. 04/16/19   Golden Circle, FNP  dolutegravir (TIVICAY) 50 MG tablet Take 1 tablet (50 mg total) by mouth daily. 05/25/19   Golden Circle, FNP  emtricitabine-tenofovir AF (DESCOVY) 200-25 MG tablet Take 1 tablet by mouth daily. 05/25/19   Golden Circle, FNP  Multiple Vitamin (MULTIVITAMIN WITH MINERALS) TABS Take 1 tablet by mouth daily.    [provider]  omeprazole (PRILOSEC) 20 MG capsule Take 1 capsule (20 mg total) by mouth daily. 03/03/19   Golden Circle, FNP  tamsulosin (FLOMAX) 0.4 MG CAPS capsule Take 1 capsule (0.4 mg total) by mouth daily. 06/25/19   Madalyn Rob, MD    Allergies    Patient has no  known allergies.  Review of Systems   Review of Systems  Constitutional: Positive for fatigue. Negative for chills and fever.  HENT: Negative for sore throat.   Eyes: Negative for visual disturbance.  Respiratory: Negative for cough and shortness of breath.   Cardiovascular: Negative for chest pain.  Gastrointestinal: Positive for abdominal pain and nausea. Negative for diarrhea, hematemesis, hematochezia and vomiting.  Genitourinary: Negative for dysuria and hematuria.  Musculoskeletal: Negative for neck pain.  Skin: Negative for rash.  Neurological: Negative for headaches.    Physical Exam Updated  Vital Signs BP (!) 144/98 (BP Location: Left Arm)   Pulse 76   Temp 97.6 F (36.4 C) (Oral)   Resp 20   SpO2 99%   Physical Exam Vitals and nursing note reviewed.  Constitutional:      Appearance: He is well-developed.  HENT:     Head: Normocephalic and atraumatic.  Eyes:     Conjunctiva/sclera: Conjunctivae normal.  Cardiovascular:     Rate and Rhythm: Normal rate and regular rhythm.     Heart sounds: No murmur.  Pulmonary:     Effort: Pulmonary effort is normal. No respiratory distress.     Breath sounds: Normal breath sounds.  Abdominal:     Palpations: Abdomen is soft.     Tenderness: There is no abdominal tenderness. There is no guarding or rebound.  Musculoskeletal:        General: No deformity or signs of injury. Normal range of motion.     Cervical back: Neck supple.  Skin:    General: Skin is warm and dry.     Capillary Refill: Capillary refill takes less than 2 seconds.  Neurological:     General: No focal deficit present.     Mental Status: He is alert.     ED Results / Procedures / Treatments   Labs (all labs ordered are listed, but only abnormal results are displayed) Labs Reviewed  LIPASE, BLOOD  COMPREHENSIVE METABOLIC PANEL  CBC  URINALYSIS, ROUTINE W REFLEX MICROSCOPIC    EKG EKG Interpretation  Date/Time:  Monday Jun 28 2019 10:47:20 EDT Ventricular Rate:  66 PR Interval:    QRS Duration: 95 QT Interval:  397 QTC Calculation: 416 R Axis:   79 Text Interpretation: Sinus rhythm Prolonged PR interval No significant change since prior 11/08 Confirmed by Aletta Edouard [phone]) on 06/28/2019 10:53:57 AM   Radiology CT Abdomen Pelvis W Contrast  Result Date: 06/28/2019 CLINICAL DATA:  Generalized abdominal pain, nausea EXAM: CT ABDOMEN AND PELVIS WITH CONTRAST TECHNIQUE: Multidetector CT imaging of the abdomen and pelvis was performed using the standard protocol following bolus administration of intravenous contrast. CONTRAST:  185mL OMNIPAQUE  IOHEXOL 300 MG/ML  SOLN COMPARISON:  None. FINDINGS: Lower chest: Hypoventilatory changes are seen at the lung bases. Hepatobiliary: No focal liver abnormality is seen. No gallstones, gallbladder wall thickening, or biliary dilatation. Pancreas: Unremarkable. No pancreatic ductal dilatation or surrounding inflammatory changes. Spleen: Normal in size without focal abnormality. Adrenals/Urinary Tract: Small cyst lower pole left kidney. Otherwise the kidneys are unremarkable. The adrenals are normal. Bladder is moderately distended with no focal abnormality. Stomach/Bowel: No bowel obstruction or ileus. No bowel wall thickening or inflammatory change. The appendix, if still present, is not well visualized. No inflammatory changes to suggest appendicitis. Vascular/Lymphatic: No significant vascular findings are present. No enlarged abdominal or pelvic lymph nodes. Reproductive: Prostate is unremarkable. Other: No abdominal wall hernia or abnormality. No abdominopelvic ascites. Musculoskeletal: No acute or destructive bony lesions. Prominent  spondylosis at L5/S1. Reconstructed images demonstrate no additional findings. IMPRESSION: 1. No acute intra-abdominal or intrapelvic process to explain the patient's symptoms. 2. Prominent spondylosis at L5/S1. Electronically Signed   By: Randa Ngo M.D.   On: 06/28/2019 15:58    Procedures Procedures (including critical care time)  Medications Ordered in ED Medications  sodium chloride flush (NS) 0.9 % injection 3 mL (has no administration in time range)  ondansetron (ZOFRAN) injection 4 mg (4 mg Intravenous Given 06/28/19 1056)  famotidine (PEPCID) IVPB 20 mg premix (0 mg Intravenous Stopped 06/28/19 1141)  dicyclomine (BENTYL) capsule 10 mg (10 mg Oral Given 06/28/19 1614)  iohexol (OMNIPAQUE) 300 MG/ML solution 100 mL (100 mLs Intravenous Contrast Given 06/28/19 1541)    ED Course  I have reviewed the triage vital signs and the nursing notes.  Pertinent labs &  imaging results that were available during my care of the patient were reviewed by me and considered in my medical decision making (see chart for details).    MDM Rules/Calculators/A&P                     This patient complains of generalized crampy abdominal discomfort; this involves an extensive number of treatment Options and is a complaint that carries with it a high risk of complications and Morbidity. The differential includes obstruction, diverticulitis, colitis, peptic ulcer disease  I ordered, reviewed and interpreted labs, which included normal CBC normal chemistries normal LFTs normal urine I ordered medication Pepcid Zofran Bentyl with some improvement in his symptoms I ordered imaging studies which included CT abdomen and pelvis and I independently    visualized and interpreted imaging which showed no acute disease Previous records obtained and reviewed in epic  After the interventions stated above, I reevaluated the patient and found patient hemodynamically stable.  Unremarkable work-up.  Will trial with Bentyl at home.  Likely related to his new meat ingestion.  Return instructions discussed   Final Clinical Impression(s) / ED Diagnoses Final diagnoses:  Generalized abdominal pain    Rx / DC Orders ED Discharge Orders         Ordered    dicyclomine (BENTYL) 20 MG tablet  2 times daily PRN     06/28/19 1605           Hayden Rasmussen, MD 06/28/19 2004

## 2019-06-28 NOTE — ED Triage Notes (Signed)
Pt sts he does not normally eat red meat d/t digestion problems and gout, but Saturday night ate an angus burger and within two hours developed generalized abdominal pain and nausea. Symptoms have persisted without improvement since, taking Peptobismol without relief. Has not vomited. Hx of same symptoms after eating steak (the last time he had red meat prior to this weekend).

## 2019-08-04 ENCOUNTER — Other Ambulatory Visit: Payer: Self-pay | Admitting: Family

## 2019-08-10 ENCOUNTER — Other Ambulatory Visit: Payer: Self-pay | Admitting: Internal Medicine

## 2019-08-10 MED ORDER — OMEPRAZOLE 20 MG PO CPDR: 20.0000 mg | DELAYED_RELEASE_CAPSULE | Freq: Every day | ORAL | 3 refills | Status: DC

## 2019-08-10 NOTE — Telephone Encounter (Signed)
Need refill on omeprazole (PRILOSEC) 20 MG capsule ;pt contact [facility] [place] [facility] [place]

## 2019-09-01 ENCOUNTER — Telehealth: Payer: Self-pay | Admitting: Internal Medicine

## 2019-09-01 NOTE — Telephone Encounter (Signed)
Pt would like to know if he could take some melation to help him sleep at night pls contact [phone]

## 2019-09-02 NOTE — Telephone Encounter (Signed)
Yes melatonin is fine.

## 2019-09-02 NOTE — Telephone Encounter (Signed)
Called pt - stated he had asked the pharmacist also and was told ok to take Melatonin along with his other meds. Also informed it's ok per Dr Koleen Distance.

## 2019-09-08 ENCOUNTER — Other Ambulatory Visit: Payer: Medicaid Other

## 2019-09-08 ENCOUNTER — Other Ambulatory Visit: Payer: Self-pay

## 2019-09-08 ENCOUNTER — Ambulatory Visit: Payer: Medicaid Other

## 2019-09-08 DIAGNOSIS — B20 Human immunodeficiency virus [HIV] disease: Secondary | ICD-10-CM | POA: Diagnosis not present

## 2019-09-08 DIAGNOSIS — Z113 Encounter for screening for infections with a predominantly sexual mode of transmission: Secondary | ICD-10-CM

## 2019-09-09 ENCOUNTER — Encounter: Payer: Self-pay | Admitting: Family

## 2019-09-09 LAB — T-HELPER CELL (CD4) - (RCID CLINIC ONLY)
CD4 % Helper T Cell: 35 % (ref 33–65)
CD4 T Cell Abs: 611 /uL (ref 400–1790)

## 2019-09-10 LAB — COMPREHENSIVE METABOLIC PANEL
AG Ratio: 1.3 (calc) (ref 1.0–2.5)
ALT: 25 U/L (ref 9–46)
AST: 28 U/L (ref 10–35)
Albumin: 4.5 g/dL (ref 3.6–5.1)
Alkaline phosphatase (APISO): 104 U/L (ref 35–144)
BUN/Creatinine Ratio: 12 (calc) (ref 6–22)
BUN: 15 mg/dL (ref 7–25)
CO2: 27 mmol/L (ref 20–32)
Calcium: 10.1 mg/dL (ref 8.6–10.3)
Chloride: 104 mmol/L (ref 98–110)
Creat: 1.3 mg/dL — ABNORMAL HIGH (ref 0.70–1.25)
Globulin: 3.4 g/dL (calc) (ref 1.9–3.7)
Glucose, Bld: 95 mg/dL (ref 65–99)
Potassium: 4.7 mmol/L (ref 3.5–5.3)
Sodium: 138 mmol/L (ref 135–146)
Total Bilirubin: 0.6 mg/dL (ref 0.2–1.2)
Total Protein: 7.9 g/dL (ref 6.1–8.1)

## 2019-09-10 LAB — RPR: RPR Ser Ql: NONREACTIVE

## 2019-09-10 LAB — HIV-1 RNA QUANT-NO REFLEX-BLD
HIV 1 RNA Quant: 144 Copies/mL — ABNORMAL HIGH
HIV-1 RNA Quant, Log: 2.16 Log cps/mL — ABNORMAL HIGH

## 2019-09-22 ENCOUNTER — Telehealth: Payer: Self-pay

## 2019-09-22 NOTE — Telephone Encounter (Signed)
COVID-19 Pre-Screening Questions:09/22/19  Do you currently have a fever (>100 F), chills or unexplained body aches? NO  Are you currently experiencing new cough, shortness of breath, sore throat, runny nose?NO  .  Have you been in contact with someone that is currently pending confirmation of Covid19 testing or has been confirmed to have the [place] virus?  NO  **If the patient answers NO to ALL questions -  advise the patient to please call the clinic before coming to the office should any symptoms develop.

## 2019-09-23 ENCOUNTER — Encounter: Payer: Self-pay | Admitting: Family

## 2019-09-23 ENCOUNTER — Other Ambulatory Visit: Payer: Self-pay

## 2019-09-23 ENCOUNTER — Ambulatory Visit (INDEPENDENT_AMBULATORY_CARE_PROVIDER_SITE_OTHER): Payer: Medicaid Other | Admitting: Family

## 2019-09-23 VITALS — Ht 77.0 in | Wt 248.0 lb

## 2019-09-23 DIAGNOSIS — Z23 Encounter for immunization: Secondary | ICD-10-CM

## 2019-09-23 DIAGNOSIS — Z0000 Encounter for general adult medical examination without abnormal findings: Secondary | ICD-10-CM | POA: Diagnosis not present

## 2019-09-23 DIAGNOSIS — B20 Human immunodeficiency virus [HIV] disease: Secondary | ICD-10-CM

## 2019-09-23 NOTE — Assessment & Plan Note (Addendum)
   Covid vaccine up-to-date per recommendations.  Discussed booster dosing.  Discussed importance of safe sexual practice to reduce risk of STI.  Condoms declined.  Prevnar updated today.  Due for colon cancer screening through colonoscopy and will defer to primary care.

## 2019-09-23 NOTE — Patient Instructions (Signed)
Nice to see you.  Continue to take your Tivicay and Descovy daily as prescribed.  Refills to be sent to the pharmacy.  Plan for follow-up in 1 month or sooner if needed to recheck your viral load.  Have a great day and stay safe!

## 2019-09-23 NOTE — Progress Notes (Signed)
Subjective:    Patient ID: Chad Little, male    DOB: 08-Jan-1961, 59 y.o.   MRN: [id number]  Chief Complaint  Patient presents with  . Follow-up     HPI:  Chad Little is a 59 y.o. male with HIV disease with risk factor including MSM who was last seen in the office on 05/25/2019 with good adherence and tolerance to his ART regimen of Tivicay and Descovy.  Lab work at the time showed a viral load of 65 and CD4 count of 628.  Most recent blood work completed on 09/08/2019 with CD4 count of 611 and viral load of 144.  Here today for routine follow-up.  Chad Little continues to take his Tivicay and Descovy daily as prescribed with no adverse side effects or missed doses since his last office visit.  Overall feeling well today.  He has been having difficulty sleeping recently waking up at 3:30 in the morning and having difficulties going back to sleep. Denies fevers, chills, night sweats, headaches, changes in vision, neck pain/stiffness, nausea, diarrhea, vomiting, lesions or rashes.  Chad Little has no problems obtaining his medication from the pharmacy and remains covered through [facility].  Denies feelings of being down, depressed, or hopeless recently.  No recreational or illicit drug use, tobacco use, or alcohol consumption.  Not currently sexually active and declines condoms. Has received both doses of Pfizer vaccination.  No Known Allergies    Outpatient Medications Prior to Visit  Medication Sig Dispense Refill  . amlodipine-atorvastatin (CADUET) 10-10 MG tablet TAKE 1 TABLET BY MOUTH DAILY 30 tablet 3  . Ascorbic Acid (VITAMIN C) 100 MG tablet Take 500 mg by mouth every other day.    . dolutegravir (TIVICAY) 50 MG tablet Take 1 tablet (50 mg total) by mouth daily. 30 tablet 5  . emtricitabine-tenofovir AF (DESCOVY) 200-25 MG tablet Take 1 tablet by mouth daily. 30 tablet 5  . Multiple Vitamin (MULTIVITAMIN WITH MINERALS) TABS Take 1 tablet by mouth daily.    [name]  omeprazole (PRILOSEC) 20 MG capsule Take 1 capsule (20 mg total) by mouth daily. 30 capsule 3  . tamsulosin (FLOMAX) 0.4 MG CAPS capsule Take 1 capsule (0.4 mg total) by mouth daily. 30 capsule 3  . dicyclomine (BENTYL) 20 MG tablet Take 1 tablet (20 mg total) by mouth 2 (two) times daily as needed for spasms. (Patient not taking: Reported on 09/23/2019) 20 tablet 0   No facility-administered medications prior to visit.     Past Medical History:  Diagnosis Date  . Enlarged prostate   . Essential hypertension   . Gout   . HIV infection ([place])   . Prediabetes      Past Surgical History:  Procedure Laterality Date  . Right elbow surgery         Review of Systems  Constitutional: Negative for appetite change, chills, fatigue, fever and unexpected weight change.  Eyes: Negative for visual disturbance.  Respiratory: Negative for cough, chest tightness, shortness of breath and wheezing.   Cardiovascular: Negative for chest pain and leg swelling.  Gastrointestinal: Negative for abdominal pain, constipation, diarrhea, nausea and vomiting.  Genitourinary: Negative for dysuria, flank pain, frequency, genital sores, hematuria and urgency.  Skin: Negative for rash.  Allergic/Immunologic: Negative for immunocompromised state.  Neurological: Negative for dizziness and headaches.      Objective:    Ht 6\' 5"  (1.956 m)   Wt 248 lb (112.5 kg)   SpO2 98%   BMI 29.41 kg/m  Nursing note and vital signs reviewed.  Physical Exam Constitutional:      General: He is not in acute distress.    Appearance: He is well-developed.  Eyes:     Conjunctiva/sclera: Conjunctivae normal.  Cardiovascular:     Rate and Rhythm: Normal rate and regular rhythm.     Heart sounds: Normal heart sounds. No murmur heard.  No friction rub. No gallop.   Pulmonary:     Effort: Pulmonary effort is normal. No respiratory distress.     Breath sounds: Normal breath sounds. No wheezing or rales.  Chest:     Chest  wall: No tenderness.  Abdominal:     General: Bowel sounds are normal.     Palpations: Abdomen is soft.     Tenderness: There is no abdominal tenderness.  Musculoskeletal:     Cervical back: Neck supple.  Lymphadenopathy:     Cervical: No cervical adenopathy.  Skin:    General: Skin is warm and dry.     Findings: No rash.  Neurological:     Mental Status: He is alert and oriented to person, place, and time.  Psychiatric:        Behavior: Behavior normal.        Thought Content: Thought content normal.        Judgment: Judgment normal.      Depression screen [facility] 2/9 05/26/2019 04/14/2019 03/03/2019 12/22/2014 12/13/2014  Decreased Interest 0 0 0 0 0  Down, Depressed, Hopeless 0 0 0 1 0  PHQ - 2 Score 0 0 0 1 0  Altered sleeping - 1 - - -  Tired, decreased energy - 0 - - -  Change in appetite - 0 - - -  Feeling bad or failure about yourself  - 0 - - -  Trouble concentrating - 0 - - -  Moving slowly or fidgety/restless - 0 - - -  Suicidal thoughts - 0 - - -  PHQ-9 Score - 1 - - -  Difficult doing work/chores - Not difficult at all - - -       Assessment & Plan:    Patient Active Problem List   Diagnosis Date Noted  . Healthcare maintenance 05/25/2019  . Prediabetes 04/14/2019  . Enlarged prostate 03/03/2019  . Arthralgia of multiple sites, bilateral 11/16/2014  . Gouty arthritis 10/31/2014  . Hypertension 10/31/2014  . Elevated serum creatinine 10/31/2014  . Onychomycosis 12/11/2011  . Gout 11/28/2008  . DENTAL CARIES 08/22/2008  . ELEVATED BLOOD PRESSURE 08/22/2008  . TOE PAIN 08/04/2008  . HIV disease ([place]) 12/28/2007  . Osteoarthritis 12/28/2007  . INSOMNIA 12/28/2007     Problem List Items Addressed This Visit      Other   HIV disease [facility])    Chad Little has good adherence and tolerance to his ART regimen of Tivicay and Descovy.  Over the past 2 office visits have noted small low level viremia now up to 144 with concern for potential growing resistance.   We will recheck blood work in 1 month to ensure accuracy and may consider archive testing if necessary.  No signs/symptoms of opportunistic infection or progressive HIV disease.  He has no problems obtaining medication from the pharmacy.  Continue current dose of Tivicay and Descovy.  Plan for follow-up in 1 month as previously noted.      Healthcare maintenance     Covid vaccine up-to-date per recommendations.  Discussed booster dosing.  Discussed importance of safe sexual practice to reduce risk  of STI.  Condoms declined.  Prevnar updated today.  Due for colon cancer screening through colonoscopy and will defer to primary care.          I am having [name] maintain his multivitamin with minerals, emtricitabine-tenofovir AF, Tivicay, tamsulosin, vitamin C, dicyclomine, amlodipine-atorvastatin, and omeprazole.   No orders of the defined types were placed in this encounter.    Follow-up: No follow-ups on file.   Terri Piedra, MSN, FNP-C Nurse Practitioner [facility] [place] number: [phone]

## 2019-09-23 NOTE — Assessment & Plan Note (Signed)
Chad Little has good adherence and tolerance to his ART regimen of Tivicay and Descovy.  Over the past 2 office visits have noted small low level viremia now up to 144 with concern for potential growing resistance.  We will recheck blood work in 1 month to ensure accuracy and may consider archive testing if necessary.  No signs/symptoms of opportunistic infection or progressive HIV disease.  He has no problems obtaining medication from the pharmacy.  Continue current dose of Tivicay and Descovy.  Plan for follow-up in 1 month as previously noted.

## 2019-09-23 NOTE — Addendum Note (Signed)
Addended by: Landis Gandy on: 09/23/2019 04:21 PM   Modules accepted: Orders

## 2019-10-08 ENCOUNTER — Telehealth: Payer: Self-pay

## 2019-10-08 NOTE — Telephone Encounter (Signed)
Patient called office today to schedule lab appointment and follow up. States that during his last visit he was not able to schedule appointment due to Epic downtime. Will schedule labs for this week due to concerns from last lab results. Is scheduled to follow up with FNP in October. Patient would like call before appointment to review labs.

## 2019-10-14 ENCOUNTER — Other Ambulatory Visit: Payer: Self-pay

## 2019-10-14 ENCOUNTER — Other Ambulatory Visit: Payer: Medicaid Other

## 2019-10-14 DIAGNOSIS — B20 Human immunodeficiency virus [HIV] disease: Secondary | ICD-10-CM | POA: Diagnosis not present

## 2019-10-15 LAB — T-HELPER CELL (CD4) - (RCID CLINIC ONLY)
CD4 % Helper T Cell: 35 % (ref 33–65)
CD4 T Cell Abs: 596 /uL (ref 400–1790)

## 2019-10-16 LAB — COMPREHENSIVE METABOLIC PANEL
AG Ratio: 1.4 (calc) (ref 1.0–2.5)
ALT: 24 U/L (ref 9–46)
AST: 28 U/L (ref 10–35)
Albumin: 4.4 g/dL (ref 3.6–5.1)
Alkaline phosphatase (APISO): 102 U/L (ref 35–144)
BUN: 14 mg/dL (ref 7–25)
CO2: 27 mmol/L (ref 20–32)
Calcium: 9.8 mg/dL (ref 8.6–10.3)
Chloride: 103 mmol/L (ref 98–110)
Creat: 1.25 mg/dL (ref 0.70–1.25)
Globulin: 3.1 g/dL (calc) (ref 1.9–3.7)
Glucose, Bld: 120 mg/dL — ABNORMAL HIGH (ref 65–99)
Potassium: 4.7 mmol/L (ref 3.5–5.3)
Sodium: 136 mmol/L (ref 135–146)
Total Bilirubin: 0.8 mg/dL (ref 0.2–1.2)
Total Protein: 7.5 g/dL (ref 6.1–8.1)

## 2019-10-16 LAB — CBC WITH DIFFERENTIAL/PLATELET
Absolute Monocytes: 784 cells/uL (ref 200–950)
Basophils Absolute: 67 cells/uL (ref 0–200)
Basophils Relative: 1 %
Eosinophils Absolute: 308 cells/uL (ref 15–500)
Eosinophils Relative: 4.6 %
HCT: 47.1 % (ref 38.5–50.0)
Hemoglobin: 16.3 g/dL (ref 13.2–17.1)
Lymphs Abs: 1709 cells/uL (ref 850–3900)
MCH: 32.7 pg (ref 27.0–33.0)
MCHC: 34.6 g/dL (ref 32.0–36.0)
MCV: 94.4 fL (ref 80.0–100.0)
MPV: 9.9 fL (ref 7.5–12.5)
Monocytes Relative: 11.7 %
Neutro Abs: 3832 cells/uL (ref 1500–7800)
Neutrophils Relative %: 57.2 %
Platelets: 283 10*3/uL (ref 140–400)
RBC: 4.99 10*6/uL (ref 4.20–5.80)
RDW: 12.6 % (ref 11.0–15.0)
Total Lymphocyte: 25.5 %
WBC: 6.7 10*3/uL (ref 3.8–10.8)

## 2019-10-16 LAB — HIV-1 RNA QUANT-NO REFLEX-BLD
HIV 1 RNA Quant: <20 Copies/mL — ABNORMAL HIGH
HIV-1 RNA Quant, Log: <1.3 Log cps/mL — ABNORMAL HIGH

## 2019-10-20 ENCOUNTER — Other Ambulatory Visit: Payer: Self-pay

## 2019-10-20 ENCOUNTER — Ambulatory Visit: Payer: Medicaid Other | Admitting: Internal Medicine

## 2019-10-20 ENCOUNTER — Encounter: Payer: Self-pay | Admitting: Internal Medicine

## 2019-10-20 VITALS — BP 131/99 | HR 97 | Temp 98.3°F | Ht 77.0 in | Wt 250.6 lb

## 2019-10-20 DIAGNOSIS — Z23 Encounter for immunization: Secondary | ICD-10-CM | POA: Diagnosis not present

## 2019-10-20 DIAGNOSIS — I10 Essential (primary) hypertension: Secondary | ICD-10-CM

## 2019-10-20 DIAGNOSIS — N401 Enlarged prostate with lower urinary tract symptoms: Secondary | ICD-10-CM | POA: Diagnosis not present

## 2019-10-20 DIAGNOSIS — R2241 Localized swelling, mass and lump, right lower limb: Secondary | ICD-10-CM

## 2019-10-20 DIAGNOSIS — R600 Localized edema: Secondary | ICD-10-CM

## 2019-10-20 DIAGNOSIS — I878 Other specified disorders of veins: Secondary | ICD-10-CM | POA: Diagnosis not present

## 2019-10-20 DIAGNOSIS — R351 Nocturia: Secondary | ICD-10-CM

## 2019-10-20 LAB — POCT URINALYSIS DIPSTICK
Bilirubin, UA: NEGATIVE
Blood, UA: NEGATIVE
Glucose, UA: NEGATIVE
Leukocytes, UA: NEGATIVE
Nitrite, UA: NEGATIVE
Protein, UA: NEGATIVE
Spec Grav, UA: 1.025 (ref 1.010–1.025)
Urobilinogen, UA: 1 E.U./dL
pH, UA: 5.5 (ref 5.0–8.0)

## 2019-10-20 MED ORDER — HYDROCHLOROTHIAZIDE 12.5 MG PO CAPS: 25.0000 mg | ORAL_CAPSULE | Freq: Every day | ORAL | 2 refills | Status: DC

## 2019-10-20 MED ORDER — ATORVASTATIN CALCIUM 10 MG PO TABS
10.0000 mg | ORAL_TABLET | Freq: Every day | ORAL | 11 refills | Status: DC
Start: 2019-10-20 — End: 2020-10-18

## 2019-10-20 MED ORDER — TAMSULOSIN HCL 0.4 MG PO CAPS: 0.8000 mg | ORAL_CAPSULE | Freq: Every day | ORAL | 3 refills | Status: DC

## 2019-10-20 NOTE — Progress Notes (Signed)
Acute Office Visit   Patient ID: Chad Little, male    DOB: 1960-12-22, 59 y.o.   MRN: [id number]  Subjective:  CC: leg swelling/discoloration, urinary sx.  HPI 59 y.o. presents today for evaluation of above concerns.  Pt notes that the leg swelling and discoloration have been present for around 7 months. Swelling is more prominent on the right but present bilaterally. The discoloration has been present for about the same amount of time and is located on his shins. No inciting event. He denies pain or puritis. He has tried several different creams to help with the discoloration without relief.   His other concern is regarding LUTS sx. He notes that he was started on flomax by a provider at an outside facility about 1 year ago. Initially symptoms improved however have began to return over the past 2 mo. Sx include nocturia, inability to empty his bladder, difficulty initiating urination and decreased flow. He denies hematuria or dysuria. He denies personal or family history of prostate cancer. No prior evaluation by urology.      ACTIVE MEDICATIONS   Current Outpatient Medications on File Prior to Visit  Medication Sig Dispense Refill  . dolutegravir (TIVICAY) 50 MG tablet Take 1 tablet (50 mg total) by mouth daily. 30 tablet 5  . emtricitabine-tenofovir AF (DESCOVY) 200-25 MG tablet Take 1 tablet by mouth daily. 30 tablet 5   No current facility-administered medications on file prior to visit.    ROS  Review of Systems  Cardiovascular: Positive for leg swelling. Negative for chest pain.  Genitourinary: Positive for difficulty urinating and frequency. Negative for dysuria and hematuria.  Skin: Positive for color change.    Objective:   BP (!) 131/99 (BP Location: Left Arm, Patient Position: Sitting, Cuff Size: Normal)   Pulse 97   Temp 98.3 F (36.8 C) (Oral)   Ht 6\' 5"  (1.956 m)   Wt 250 lb 9.6 oz (113.7 kg)   SpO2 98% Comment: room air  BMI 29.72 kg/m  Wt  Readings from Last 3 Encounters:  10/20/19 250 lb 9.6 oz (113.7 kg)  09/23/19 248 lb (112.5 kg)  05/26/19 251 lb (113.9 kg)   BP Readings from Last 3 Encounters:  10/20/19 (!) 131/99  06/28/19 128/89  05/26/19 129/89   Physical Exam Cardiovascular:     Rate and Rhythm: Normal rate and regular rhythm.     Pulses:          Dorsalis pedis pulses are 3+ on the right side and 3+ on the left side.       Posterior tibial pulses are 3+ on the right side and 3+ on the left side.  Pulmonary:     Effort: Pulmonary effort is normal.     Breath sounds: Normal breath sounds.  Abdominal:     General: There is no distension.  Musculoskeletal:     Right lower leg: 3+ Edema present.     Left lower leg: 1+ Edema present.  Skin:    General: Skin is warm.     Comments: Bilateral lower extremities without hair     Health Maintenance:   Health Maintenance  Topic Date Due  . TETANUS/TDAP  Never done  . COLONOSCOPY  Never done  . INFLUENZA VACCINE  Completed  . COVID-19 Vaccine  Completed  . Hepatitis C Screening  Completed  . HIV Screening  Completed     Assessment & Plan:   Problem List Items Addressed This Visit  Cardiovascular and Mediastinum   Hypertension    Blood pressure at goal this visit. On amlodipine-atorvastatin 10-10mg . No issues with hypotension. His primary complaint at today's visit is lower extremity edema (see separate problem). Amlodipine can cause peripheral edema so we discussed trying to switch to an alternative medication which he was agreeable to.  9/9 BMP reviewed.  Plan --amlodipine-atorvastatin stopped --start hctz 12.5mg  daily --continue atorvastatin 10mg  daily--dose reduced 2/2 interaction with HIV meds interaction>decreased drug metabolism --f/u in 4-6w for       Relevant Medications   hydrochlorothiazide (MICROZIDE) 12.5 MG capsule   atorvastatin (LIPITOR) 10 MG tablet     Genitourinary   BPH (benign prostatic hyperplasia) with LUTS -  Primary    Currently on flomax 0.4mg  which had been started around 7 mo ago by an outside provider. Symptoms initially improved however have returned again over the past 2 months.  PSA 1.0 today. I had also obtained a UA today to r/o cystitis. UA was unremarkable and without hematuria. Plan:  --increase flomax to 0.8mg  --f/u in 4-6 weeks with telehealth appointment --if sx are not improved, can consider adding a 2nd agent such as finasteride or terazosin.      Relevant Medications   tamsulosin (FLOMAX) 0.4 MG CAPS capsule   Other Relevant Orders   Ambulatory referral to Urology   Urinalysis, Reflex Microscopic (Completed)   PSA (Completed)   POCT Urinalysis Dipstick (82505) (Completed)     Other   Lower extremity edema    This is an initial OV for evaluation of 61mo hx of lower extremity edema (R>L).  My primary differentials are edema 2/2 varicose veins vs medication induced (amlodipine) vs chronic DVT.  An additional finding on exam was hairlessness on his b/l lower extremities. I obtained POC ABIs in the clinic today which were unremarkable. Left 1.28. Right 1.27 Plan --I have ordered a RLE duplex US to evaluate for a chronic DVT since the swelling is asymmetric --I have also switched him from amlodipine to hctz (see htn problem) --Encourage use of compression socks       Other Visit Diagnoses    Venous stasis       Relevant Orders   POCT ABI Screening Pilot No Charge   Localized swelling of right lower extremity       Relevant Orders   VAS [place] LOWER EXTREMITY VENOUS (DVT)   Need for immunization against influenza       Relevant Orders   Flu Vaccine QUAD 61+ mos IM (Completed)        Pt discussed with Dr. Marcille Buffy, MD Internal Medicine Resident PGY-2 [facility] Pager: [phone] 10/21/2019 11:23 AM

## 2019-10-21 DIAGNOSIS — R600 Localized edema: Secondary | ICD-10-CM | POA: Insufficient documentation

## 2019-10-21 LAB — URINALYSIS, ROUTINE W REFLEX MICROSCOPIC
Bilirubin, UA: NEGATIVE
Glucose, UA: NEGATIVE
Leukocytes,UA: NEGATIVE
Nitrite, UA: NEGATIVE
Protein,UA: NEGATIVE
RBC, UA: NEGATIVE
Specific Gravity, UA: 1.019 (ref 1.005–1.030)
Urobilinogen, Ur: 1 mg/dL (ref 0.2–1.0)
pH, UA: 5.5 (ref 5.0–7.5)

## 2019-10-21 LAB — PSA: Prostate Specific Ag, Serum: 1 ng/mL (ref 0.0–4.0)

## 2019-10-21 NOTE — Assessment & Plan Note (Addendum)
This is an initial OV for evaluation of 48mo hx of lower extremity edema (R>L).  My primary differentials are edema 2/2 varicose veins vs medication induced (amlodipine) vs chronic DVT.  An additional finding on exam was hairlessness on his b/l lower extremities. I obtained POC ABIs in the clinic today which were unremarkable. Left 1.28. Right 1.27 Plan --I have ordered a RLE duplex US to evaluate for a chronic DVT since the swelling is asymmetric --I have also switched him from amlodipine to hctz (see htn problem) --Encourage use of compression socks

## 2019-10-21 NOTE — Assessment & Plan Note (Addendum)
Currently on flomax 0.4mg  which had been started around 7 mo ago by an outside provider. Symptoms initially improved however have returned again over the past 2 months.  PSA 1.0 today. I had also obtained a UA today to r/o cystitis. UA was unremarkable and without hematuria. Plan:  --increase flomax to 0.8mg  --f/u in 4-6 weeks with telehealth appointment --if sx are not improved, can consider adding a 2nd agent such as finasteride or terazosin.

## 2019-10-21 NOTE — Assessment & Plan Note (Addendum)
Blood pressure at goal this visit. On amlodipine-atorvastatin 10-10mg . No issues with hypotension. His primary complaint at today's visit is lower extremity edema (see separate problem). Amlodipine can cause peripheral edema so we discussed trying to switch to an alternative medication which he was agreeable to.  9/9 BMP reviewed.  Plan --amlodipine-atorvastatin stopped --start hctz 12.5mg  daily --continue atorvastatin 10mg  daily--dose reduced 2/2 interaction with HIV meds interaction>decreased drug metabolism --f/u in 4-6w for

## 2019-10-25 ENCOUNTER — Ambulatory Visit (HOSPITAL_COMMUNITY)
Admission: RE | Admit: 2019-10-25 | Discharge: 2019-10-25 | Disposition: A | Discharge status: Home / Self Care | Payer: Medicaid Other | Source: Ambulatory Visit | Attending: Internal Medicine | Admitting: Internal Medicine

## 2019-10-25 ENCOUNTER — Other Ambulatory Visit: Payer: Self-pay

## 2019-10-25 DIAGNOSIS — R2241 Localized swelling, mass and lump, right lower limb: Secondary | ICD-10-CM | POA: Diagnosis not present

## 2019-10-25 NOTE — Progress Notes (Signed)
Bilateral lower extremity venous duplex completed. Refer to "CV Proc" under chart review to view preliminary results.  10/25/2019 3:21 PM Kelby Aline., MHA, RVT, RDCS, RDMS

## 2019-10-25 NOTE — Progress Notes (Signed)
Please let him know that he does not have a blood clot in his leg. If he would like further workup, I would encourage him to follow up in the office.

## 2019-10-26 ENCOUNTER — Telehealth: Payer: Self-pay

## 2019-10-26 NOTE — Telephone Encounter (Signed)
Mitzi Hansen, MD  P Imp Triage Nurse Pool Please let him know that he does not have a blood clot in his leg. If he would like further workup, I would encourage him to follow up in the office.     Patient called and notified that Venous US study showed he did not have a blood clot in his leg and if he desired further work-up to make an in-office appointment per Dr. Imagene Gurney instructions.  Patient very appreciative, states he has an appt on 11/10/19 and will f/u then if still needed. SChaplin, RN,BSN

## 2019-10-26 NOTE — Progress Notes (Signed)
Internal Medicine Clinic Attending  Case discussed with Dr. Christian  At the time of the visit.  We reviewed the resident's history and exam and pertinent patient test results.  I agree with the assessment, diagnosis, and plan of care documented in the resident's note.  

## 2019-10-28 ENCOUNTER — Telehealth: Payer: Self-pay

## 2019-10-28 NOTE — Telephone Encounter (Signed)
It is ok to wait until next appointment unless symptoms worsen or change.

## 2019-10-28 NOTE — Telephone Encounter (Signed)
RTC, patient states he had a clinic visit with Dr. Darrick Meigs on 10/20/19 and his b/p medication was changed to HCTZ.  He c/o of a slight, dull H/A X 3 days so he took his b/p which he reports as 135/93 yesterday And 136/97 today. He denies any chest pain, SOB, double vision.  He does note his "vision has been blurry at times, but this subsides quickly after blinking" RN noted his b/p in clinic on 9/15 was 131/99.  Informed patient that sometimes it takes a couple of weeks for b/p medications to start working effectively.  He verbalized understanding and states the swelling in his legs to be much improved.  Patient has a f/u appt on 11/10/19 w/ PCP.  Will route to [facility] team to advise if they would like patient seen before this date. Thank you, SChaplin, RN,BSN

## 2019-10-28 NOTE — Telephone Encounter (Signed)
Patient notified, he verbalized understanding. SChaplin, RN,BSN

## 2019-10-28 NOTE — Telephone Encounter (Signed)
Requesting to speak with a nurse about bp. Please call pt back.  

## 2019-11-08 ENCOUNTER — Telehealth: Payer: Self-pay

## 2019-11-08 NOTE — Telephone Encounter (Signed)
COVID-19 Pre-Screening Questions:11/08/19  Do you currently have a fever (>100 F), chills or unexplained body aches? *NO  Are you currently experiencing new cough, shortness of breath, sore throat, runny nose? NO .  Have you been in contact with someone that is currently pending confirmation of Covid19 testing or has been confirmed to have the [place] virus?  NO   **If the patient answers NO to ALL questions -  advise the patient to please call the clinic before coming to the office should any symptoms develop.

## 2019-11-09 ENCOUNTER — Encounter: Payer: Self-pay | Admitting: Family

## 2019-11-09 ENCOUNTER — Ambulatory Visit (INDEPENDENT_AMBULATORY_CARE_PROVIDER_SITE_OTHER): Payer: Medicaid Other

## 2019-11-09 ENCOUNTER — Other Ambulatory Visit: Payer: Self-pay

## 2019-11-09 ENCOUNTER — Ambulatory Visit (INDEPENDENT_AMBULATORY_CARE_PROVIDER_SITE_OTHER): Payer: Medicaid Other | Admitting: Family

## 2019-11-09 VITALS — BP 162/90 | HR 67 | Wt 250.0 lb

## 2019-11-09 DIAGNOSIS — Z113 Encounter for screening for infections with a predominantly sexual mode of transmission: Secondary | ICD-10-CM

## 2019-11-09 DIAGNOSIS — B20 Human immunodeficiency virus [HIV] disease: Secondary | ICD-10-CM

## 2019-11-09 DIAGNOSIS — Z0000 Encounter for general adult medical examination without abnormal findings: Secondary | ICD-10-CM | POA: Diagnosis not present

## 2019-11-09 DIAGNOSIS — Z23 Encounter for immunization: Secondary | ICD-10-CM | POA: Diagnosis not present

## 2019-11-09 MED ORDER — TIVICAY 50 MG PO TABS: 50.0000 mg | ORAL_TABLET | Freq: Every day | ORAL | 5 refills | Status: DC

## 2019-11-09 MED ORDER — EMTRICITABINE-TENOFOVIR AF 200-25 MG PO TABS: 1.0000 | ORAL_TABLET | Freq: Every day | ORAL | 5 refills | Status: DC

## 2019-11-09 NOTE — Patient Instructions (Signed)
Nice to see you.  We will continue your current dose of Tivicay and Descovy.   Refills have been sent to the pharmacy.  Continue to work with primary care regarding your blood pressure.  Plan for follow up in 4 months or sooner if needed with lab work 1-2 weeks prior to your appointment.  Have a great day and stay safe!

## 2019-11-09 NOTE — Progress Notes (Signed)
Subjective:    Patient ID: Chad Little, male    DOB: 08-10-1960, 59 y.o.   MRN: [id number]  Chief Complaint  Patient presents with  . Follow-up    No questions or concerns     HPI:  Chad Little is a 59 y.o. male with HIV disease who was last seen in the office on 09/23/2019 with good adherence and tolerance to his ART regimen Tivicay and Descovy.  There was concern at the time for elevating viral loads and developing possible resistance.  Most recent blood work shows a viral load that was undetectable.  Here today for routine follow-up.  [name] continues to take his Tivicay and Descovy daily as prescribed with no adverse side effects or missed doses since his last office visit.  Overall feeling well today with no new concerns/complaints. Denies fevers, chills, night sweats, headaches, changes in vision, neck pain/stiffness, nausea, diarrhea, vomiting, lesions or rashes.  [name] has no problems obtaining his medications from the pharmacy.  Denies feelings of being down, depressed, or hopeless recently.  No recreational or illicit drug use, tobacco use, or alcohol consumption.  He is interested in receiving his COVID-19 booster.   No Known Allergies    Outpatient Medications Prior to Visit  Medication Sig Dispense Refill  . atorvastatin (LIPITOR) 10 MG tablet Take 1 tablet (10 mg total) by mouth daily. 30 tablet 11  . hydrochlorothiazide (MICROZIDE) 12.5 MG capsule Take 2 capsules (25 mg total) by mouth daily. 60 capsule 2  . tamsulosin (FLOMAX) 0.4 MG CAPS capsule Take 2 capsules (0.8 mg total) by mouth daily. 30 capsule 3  . dolutegravir (TIVICAY) 50 MG tablet Take 1 tablet (50 mg total) by mouth daily. 30 tablet 5  . emtricitabine-tenofovir AF (DESCOVY) 200-25 MG tablet Take 1 tablet by mouth daily. 30 tablet 5   No facility-administered medications prior to visit.     Past Medical History:  Diagnosis Date  . Enlarged prostate   . Essential  hypertension   . Gout   . HIV infection ([place])   . Prediabetes      Past Surgical History:  Procedure Laterality Date  . Right elbow surgery         Review of Systems  Constitutional: Negative for appetite change, chills, fatigue, fever and unexpected weight change.  Eyes: Negative for visual disturbance.  Respiratory: Negative for cough, chest tightness, shortness of breath and wheezing.   Cardiovascular: Negative for chest pain and leg swelling.  Gastrointestinal: Negative for abdominal pain, constipation, diarrhea, nausea and vomiting.  Genitourinary: Negative for dysuria, flank pain, frequency, genital sores, hematuria and urgency.  Skin: Negative for rash.  Allergic/Immunologic: Negative for immunocompromised state.  Neurological: Negative for dizziness and headaches.      Objective:    BP (!) 162/90   Pulse 67   Wt 250 lb (113.4 kg)   SpO2 94%   BMI 29.65 kg/m  Nursing note and vital signs reviewed.  Physical Exam Constitutional:      General: He is not in acute distress.    Appearance: He is well-developed.  Eyes:     Conjunctiva/sclera: Conjunctivae normal.  Cardiovascular:     Rate and Rhythm: Normal rate and regular rhythm.     Heart sounds: Normal heart sounds. No murmur heard.  No friction rub. No gallop.   Pulmonary:     Effort: Pulmonary effort is normal. No respiratory distress.     Breath sounds: Normal breath sounds. No wheezing or  rales.  Chest:     Chest wall: No tenderness.  Abdominal:     General: Bowel sounds are normal.     Palpations: Abdomen is soft.     Tenderness: There is no abdominal tenderness.  Musculoskeletal:     Cervical back: Neck supple.  Lymphadenopathy:     Cervical: No cervical adenopathy.  Skin:    General: Skin is warm and dry.     Findings: No rash.  Neurological:     Mental Status: He is alert and oriented to person, place, and time.  Psychiatric:        Behavior: Behavior normal.        Thought Content:  Thought content normal.        Judgment: Judgment normal.      Depression screen [facility] 2/9 09/23/2019 05/26/2019 04/14/2019 03/03/2019 12/22/2014  Decreased Interest 0 0 0 0 0  Down, Depressed, Hopeless 0 0 0 0 1  PHQ - 2 Score 0 0 0 0 1  Altered sleeping - - 1 - -  Tired, decreased energy - - 0 - -  Change in appetite - - 0 - -  Feeling bad or failure about yourself  - - 0 - -  Trouble concentrating - - 0 - -  Moving slowly or fidgety/restless - - 0 - -  Suicidal thoughts - - 0 - -  PHQ-9 Score - - 1 - -  Difficult doing work/chores - - Not difficult at all - -       Assessment & Plan:    Patient Active Problem List   Diagnosis Date Noted  . Lower extremity edema 10/21/2019  . Healthcare maintenance 05/25/2019  . Prediabetes 04/14/2019  . BPH (benign prostatic hyperplasia) with LUTS 03/03/2019  . Hypertension 10/31/2014  . Gout 11/28/2008  . HIV disease ([place]) 12/28/2007  . Osteoarthritis 12/28/2007     Problem List Items Addressed This Visit      Other   HIV disease [facility])    Mr. [name] continues to have well-controlled HIV disease with good adherence and tolerance to his ART regimen of Tivicay and Descovy.  We reviewed previous lab work and discussed plan of care.  No current signs/symptoms of opportunistic infection or progressive HIV disease.  Continue current dose of Tivicay and Descovy.  Plan for follow-up in 4 months or sooner if needed with lab work 1 to 2 weeks prior to appointment.      Relevant Medications   emtricitabine-tenofovir AF (DESCOVY) 200-25 MG tablet   dolutegravir (TIVICAY) 50 MG tablet   Other Relevant Orders   HIV-1 RNA quant-no reflex-bld   T-helper cell (CD4)- ([facility] clinic only)   Comprehensive metabolic panel   CBC   Healthcare maintenance     Pfizer third dose booster updated today.  Discussed importance of safe sexual practice to reduce risk of STI.  Condoms provided.       Other Visit Diagnoses    Screening for STDs (sexually  transmitted diseases)    -  Primary   Relevant Orders   RPR       I am having [name] maintain his hydrochlorothiazide, atorvastatin, tamsulosin, emtricitabine-tenofovir AF, and Tivicay.   Meds ordered this encounter  Medications  . emtricitabine-tenofovir AF (DESCOVY) 200-25 MG tablet    Sig: Take 1 tablet by mouth daily.    Dispense:  30 tablet    Refill:  5    Order Specific Question:   Supervising Provider    Answer:  [place], [place]  . dolutegravir (TIVICAY) 50 MG tablet    Sig: Take 1 tablet (50 mg total) by mouth daily.    Dispense:  30 tablet    Refill:  5    Order Specific Question:   Supervising Provider    Answer:   Carlyle Basques [[id number]]     Follow-up: Return in about 4 months (around 03/11/2020), or if symptoms worsen or fail to improve.   Terri Piedra, MSN, FNP-C Nurse Practitioner [facility] [place] number: [phone]

## 2019-11-09 NOTE — Assessment & Plan Note (Signed)
Chad Little continues to have well-controlled HIV disease with good adherence and tolerance to his ART regimen of Tivicay and Descovy.  We reviewed previous lab work and discussed plan of care.  No current signs/symptoms of opportunistic infection or progressive HIV disease.  Continue current dose of Tivicay and Descovy.  Plan for follow-up in 4 months or sooner if needed with lab work 1 to 2 weeks prior to appointment.

## 2019-11-09 NOTE — Assessment & Plan Note (Signed)
   Pfizer third dose booster updated today.  Discussed importance of safe sexual practice to reduce risk of STI.  Condoms provided.

## 2019-11-09 NOTE — Progress Notes (Signed)
   Covid-19 Vaccination Clinic  Name:  TRAVES MAJCHRZAK    MRN: [id number] DOB: October 25, 1960  11/09/2019  Mr. Hibler was observed post Covid-19 immunization for 15 minutes without incident. He was provided with Vaccine Information Sheet and instruction to access the V-Safe system.   Mr. Bensinger was instructed to call 911 with any severe reactions post vaccine: [name] Difficulty breathing  . Swelling of face and throat  . A fast heartbeat  . A bad rash all over body  . Dizziness and weakness     Harold Mattes T Brooks Sailors

## 2019-11-10 ENCOUNTER — Ambulatory Visit: Payer: Medicaid Other | Admitting: Internal Medicine

## 2019-11-10 ENCOUNTER — Encounter: Payer: Self-pay | Admitting: Internal Medicine

## 2019-11-10 VITALS — BP 135/96 | HR 100 | Wt 251.2 lb

## 2019-11-10 DIAGNOSIS — I10 Essential (primary) hypertension: Secondary | ICD-10-CM | POA: Diagnosis not present

## 2019-11-10 MED ORDER — HYDROCHLOROTHIAZIDE 25 MG PO TABS: 25.0000 mg | ORAL_TABLET | Freq: Every day | ORAL | 2 refills | Status: DC

## 2019-11-10 MED ORDER — BETAMETHASONE DIPROPIONATE 0.05 % EX CREA: TOPICAL_CREAM | Freq: Two times a day (BID) | CUTANEOUS | 1 refills | Status: DC

## 2019-11-10 NOTE — Progress Notes (Signed)
Acute Office Visit  Subjective:    Patient ID: Chad Little, male    DOB: 1960/12/17, 59 y.o.   MRN: [id number]  Chief Complaint  Patient presents with  . Blood Pressure Check  . Medication Refill    bethamethasone cream "rash" on leg    HPI Patient is in today for follow-up on HTN. Please see problem based charting for further details.   Past Medical History:  Diagnosis Date  . Enlarged prostate   . Essential hypertension   . Gout   . HIV infection ([place])   . Prediabetes     Past Surgical History:  Procedure Laterality Date  . Right elbow surgery      Family History  Problem Relation Age of Onset  . Alcohol abuse Mother   . Hypertension Father   . Stroke Father     Social History   Socioeconomic History  . Marital status: Single    Spouse name: Not on file  . Number of children: Not on file  . Years of education: Not on file  . Highest education level: Not on file  Occupational History  . Not on file  Tobacco Use  . Smoking status: Never Smoker  . Smokeless tobacco: Never Used  Vaping Use  . Vaping Use: Never used  Substance and Sexual Activity  . Alcohol use: Not Currently    Alcohol/week: 0.0 standard drinks  . Drug use: No  . Sexual activity: Not Currently    Comment: refused  condoms 11/09/2019  Other Topics Concern  . Not on file  Social History Narrative  . Not on file   Social Determinants of Health   Financial Resource Strain:   . Difficulty of Paying Living Expenses: Not on file  Food Insecurity:   . Worried About Charity fundraiser in the Last Year: Not on file  . Ran Out of Food in the Last Year: Not on file  Transportation Needs:   . Lack of Transportation (Medical): Not on file  . Lack of Transportation (Non-Medical): Not on file  Physical Activity:   . Days of Exercise per Week: Not on file  . Minutes of Exercise per Session: Not on file  Stress:   . Feeling of Stress : Not on file  Social Connections:   .  Frequency of Communication with Friends and Family: Not on file  . Frequency of Social Gatherings with Friends and Family: Not on file  . Attends Religious Services: Not on file  . Active Member of Clubs or Organizations: Not on file  . Attends Archivist Meetings: Not on file  . Marital Status: Not on file  Intimate Partner Violence:   . Fear of Current or Ex-Partner: Not on file  . Emotionally Abused: Not on file  . Physically Abused: Not on file  . Sexually Abused: Not on file    Outpatient Medications Prior to Visit  Medication Sig Dispense Refill  . atorvastatin (LIPITOR) 10 MG tablet Take 1 tablet (10 mg total) by mouth daily. 30 tablet 11  . dolutegravir (TIVICAY) 50 MG tablet Take 1 tablet (50 mg total) by mouth daily. 30 tablet 5  . emtricitabine-tenofovir AF (DESCOVY) 200-25 MG tablet Take 1 tablet by mouth daily. 30 tablet 5  . tamsulosin (FLOMAX) 0.4 MG CAPS capsule Take 2 capsules (0.8 mg total) by mouth daily. 30 capsule 3  . hydrochlorothiazide (MICROZIDE) 12.5 MG capsule Take 2 capsules (25 mg total) by mouth daily. 60 capsule 2  No facility-administered medications prior to visit.    No Known Allergies  Review of Systems  Constitutional: Negative for fatigue.  Respiratory: Negative for shortness of breath.   Cardiovascular: Negative for chest pain, palpitations and leg swelling.  Neurological: Negative for light-headedness and headaches.       Objective:    Physical Exam Constitutional:      General: He is not in acute distress.    Appearance: Normal appearance.  Cardiovascular:     Rate and Rhythm: Normal rate and regular rhythm.  Pulmonary:     Effort: Pulmonary effort is normal.     Breath sounds: Normal breath sounds.  Musculoskeletal:     Right lower leg: No edema.     Left lower leg: Edema (trace left lower extremity edema ) present.  Neurological:     Mental Status: He is alert.     BP (!) 135/96 (BP Location: Left Arm, Patient  Position: Sitting, Cuff Size: Normal)   Pulse 100   Wt 251 lb 3.2 oz (113.9 kg)   SpO2 99%   BMI 29.79 kg/m  Wt Readings from Last 3 Encounters:  11/10/19 251 lb 3.2 oz (113.9 kg)  11/09/19 250 lb (113.4 kg)  10/20/19 250 lb 9.6 oz (113.7 kg)    Health Maintenance Due  Topic Date Due  . TETANUS/TDAP  Never done  . COLONOSCOPY  Never done    There are no preventive care reminders to display for this patient.   No results found for: TSH Lab Results  Component Value Date   WBC 6.7 10/14/2019   HGB 16.3 10/14/2019   HCT 47.1 10/14/2019   MCV 94.4 10/14/2019   PLT 283 10/14/2019   Lab Results  Component Value Date   NA 136 10/14/2019   K 4.7 10/14/2019   CO2 27 10/14/2019   GLUCOSE 120 (H) 10/14/2019   BUN 14 10/14/2019   CREATININE 1.25 10/14/2019   BILITOT 0.8 10/14/2019   ALKPHOS 92 06/28/2019   AST 28 10/14/2019   ALT 24 10/14/2019   PROT 7.5 10/14/2019   ALBUMIN 4.1 06/28/2019   CALCIUM 9.8 10/14/2019   ANIONGAP 9 06/28/2019   Lab Results  Component Value Date   CHOL 124 05/10/2019   Lab Results  Component Value Date   HDL 54 05/10/2019   Lab Results  Component Value Date   LDLCALC 61 05/10/2019   Lab Results  Component Value Date   TRIG 31 05/10/2019   Lab Results  Component Value Date   CHOLHDL 2.3 05/10/2019   Lab Results  Component Value Date   HGBA1C 5.7 (H) 05/10/2019       Assessment & Plan:   Problem List Items Addressed This Visit      Cardiovascular and Mediastinum   Hypertension - Primary    [name] is pleased with the improvement in his leg swelling since switching from amlodipine to HCTZ. He notes his diastolic readings are consistently in the 90s. Given his history of chronic HIV, it would be ideal to target a tighter blood pressure control of <130/80. Patient indicates he would prefer this also. We will check a BMP today given the new start of HCTZ and increase from 12.5 mg to 25 mg daily. Follow-up with PCP as scheduled  and can repeat BMP at that time.       Relevant Medications   hydrochlorothiazide (HYDRODIURIL) 25 MG tablet   Other Relevant Orders   BMP8+Anion Gap       Meds  ordered this encounter  Medications  . betamethasone dipropionate 0.05 % cream    Sig: Apply topically 2 (two) times daily.    Dispense:  45 g    Refill:  1  . hydrochlorothiazide (HYDRODIURIL) 25 MG tablet    Sig: Take 1 tablet (25 mg total) by mouth daily.    Dispense:  30 tablet    Refill:  2     Deshon Hsiao D Estelle Skibicki, DO

## 2019-11-10 NOTE — Assessment & Plan Note (Signed)
Chad Little is pleased with the improvement in his leg swelling since switching from amlodipine to HCTZ. He notes his diastolic readings are consistently in the 90s. Given his history of chronic HIV, it would be ideal to target a tighter blood pressure control of <130/80. Patient indicates he would prefer this also. We will check a BMP today given the new start of HCTZ and increase from 12.5 mg to 25 mg daily. Follow-up with PCP as scheduled and can repeat BMP at that time.

## 2019-11-10 NOTE — Patient Instructions (Signed)
Mr. Simington, It was nice meeting you!  Today we discussed your blood pressure. I think it is reasonable to want the bottom number lower. Hopefully with increasing the dose of your medication this will help. I'm glad the swelling improved after changing medications.  We will check some lab work today, and then have you follow-up with your primary physician as scheduled.   Take care, Dr. Koleen Distance

## 2019-11-11 ENCOUNTER — Telehealth: Payer: Self-pay | Admitting: *Deleted

## 2019-11-11 LAB — BMP8+ANION GAP
Anion Gap: 15 mmol/L (ref 10.0–18.0)
BUN/Creatinine Ratio: 9 — ABNORMAL LOW (ref 10–24)
BUN: 12 mg/dL (ref 8–27)
CO2: 25 mmol/L (ref 20–29)
Calcium: 9.8 mg/dL (ref 8.6–10.2)
Chloride: 99 mmol/L (ref 96–106)
Creatinine, Ser: 1.33 mg/dL — ABNORMAL HIGH (ref 0.76–1.27)
GFR calc Af Amer: 66 mL/min/{1.73_m2} (ref >59)
GFR calc non Af Amer: 57 mL/min/{1.73_m2} — ABNORMAL LOW (ref >59)
Glucose: 90 mg/dL (ref 65–99)
Potassium: 4.8 mmol/L (ref 3.5–5.2)
Sodium: 139 mmol/L (ref 134–144)

## 2019-11-11 MED ORDER — BETAMETHASONE VALERATE 0.1 % EX OINT: 1.0000 "application " | TOPICAL_OINTMENT | Freq: Two times a day (BID) | CUTANEOUS | 0 refills | Status: DC

## 2019-11-11 NOTE — Telephone Encounter (Signed)
Call to [employer] for PA for Betamethasone Dip 0.05% Cream.  Not on formulary.  Changed to Betamethasone  Valarate Creme 1% per Dr. Franchot Erichsen. I [id number].  Sander Nephew, RN 11/11/2019 3:10 PM.

## 2019-11-11 NOTE — Addendum Note (Signed)
Addended by: Modena Nunnery D on: 11/11/2019 02:45 PM   Modules accepted: Orders

## 2019-11-11 NOTE — Progress Notes (Signed)
Internal Medicine Clinic Attending  Case discussed with Dr. Bloomfield  At the time of the visit.  We reviewed the resident's history and exam and pertinent patient test results.  I agree with the assessment, diagnosis, and plan of care documented in the resident's note.  

## 2019-11-18 ENCOUNTER — Encounter: Payer: Self-pay | Admitting: Internal Medicine

## 2019-11-18 ENCOUNTER — Other Ambulatory Visit: Payer: Self-pay

## 2019-11-18 ENCOUNTER — Ambulatory Visit: Payer: Medicaid Other | Admitting: Internal Medicine

## 2019-11-18 DIAGNOSIS — Z79899 Other long term (current) drug therapy: Secondary | ICD-10-CM

## 2019-11-18 DIAGNOSIS — I10 Essential (primary) hypertension: Secondary | ICD-10-CM

## 2019-11-18 MED ORDER — OLMESARTAN MEDOXOMIL-HCTZ 40-25 MG PO TABS: 1.0000 | ORAL_TABLET | Freq: Every day | ORAL | 0 refills | Status: DC

## 2019-11-18 NOTE — Progress Notes (Signed)
Acute Office Visit  Subjective:    Patient ID: Chad Little, male    DOB: 11-21-1960, 59 y.o.   MRN: [id number]  Chief Complaint  Patient presents with  . Medication Management    HPI Patient is in today for Blood pressure follow-up, medication question. Please see problem based charting for further details.   Past Medical History:  Diagnosis Date  . Enlarged prostate   . Essential hypertension   . Gout   . HIV infection ([place])   . Prediabetes     Past Surgical History:  Procedure Laterality Date  . Right elbow surgery      Family History  Problem Relation Age of Onset  . Alcohol abuse Mother   . Hypertension Father   . Stroke Father     Social History   Socioeconomic History  . Marital status: Single    Spouse name: Not on file  . Number of children: Not on file  . Years of education: Not on file  . Highest education level: Not on file  Occupational History  . Not on file  Tobacco Use  . Smoking status: Former Smoker    Quit date: 10/19/2019    Years since quitting: 0.0  . Smokeless tobacco: Never Used  . Tobacco comment: stopped 4 weeks ago  Vaping Use  . Vaping Use: Never used  Substance and Sexual Activity  . Alcohol use: Not Currently    Alcohol/week: 0.0 standard drinks  . Drug use: No  . Sexual activity: Not Currently    Comment: refused  condoms 11/09/2019  Other Topics Concern  . Not on file  Social History Narrative  . Not on file   Social Determinants of Health   Financial Resource Strain:   . Difficulty of Paying Living Expenses: Not on file  Food Insecurity:   . Worried About Charity fundraiser in the Last Year: Not on file  . Ran Out of Food in the Last Year: Not on file  Transportation Needs:   . Lack of Transportation (Medical): Not on file  . Lack of Transportation (Non-Medical): Not on file  Physical Activity:   . Days of Exercise per Week: Not on file  . Minutes of Exercise per Session: Not on file  Stress:   .  Feeling of Stress : Not on file  Social Connections:   . Frequency of Communication with Friends and Family: Not on file  . Frequency of Social Gatherings with Friends and Family: Not on file  . Attends Religious Services: Not on file  . Active Member of Clubs or Organizations: Not on file  . Attends Archivist Meetings: Not on file  . Marital Status: Not on file  Intimate Partner Violence:   . Fear of Current or Ex-Partner: Not on file  . Emotionally Abused: Not on file  . Physically Abused: Not on file  . Sexually Abused: Not on file    Outpatient Medications Prior to Visit  Medication Sig Dispense Refill  . atorvastatin (LIPITOR) 10 MG tablet Take 1 tablet (10 mg total) by mouth daily. 30 tablet 11  . betamethasone valerate ointment (VALISONE) 0.1 % Apply 1 application topically 2 (two) times daily. 30 g 0  . dolutegravir (TIVICAY) 50 MG tablet Take 1 tablet (50 mg total) by mouth daily. 30 tablet 5  . emtricitabine-tenofovir AF (DESCOVY) 200-25 MG tablet Take 1 tablet by mouth daily. 30 tablet 5  . tamsulosin (FLOMAX) 0.4 MG CAPS capsule Take 2  capsules (0.8 mg total) by mouth daily. 30 capsule 3  . hydrochlorothiazide (HYDRODIURIL) 25 MG tablet Take 1 tablet (25 mg total) by mouth daily. 30 tablet 2   No facility-administered medications prior to visit.    No Known Allergies  Review of Systems  Constitutional: Positive for fatigue.  Eyes: Negative for visual disturbance.  Respiratory: Negative for shortness of breath.   Cardiovascular: Negative for chest pain and palpitations.  Neurological: Positive for headaches. Negative for syncope.       Objective:    Physical Exam Constitutional:      General: He is not in acute distress.    Appearance: Normal appearance.  Cardiovascular:     Rate and Rhythm: Normal rate.  Pulmonary:     Effort: Pulmonary effort is normal.  Neurological:     General: No focal deficit present.     Mental Status: He is alert.    Psychiatric:        Mood and Affect: Mood normal.        Behavior: Behavior normal.     BP (!) 149/99 (BP Location: Left Arm, Patient Position: Sitting, Cuff Size: Normal)   Pulse 86   Temp 98.4 F (36.9 C) (Oral)   Wt 250 lb 4.8 oz (113.5 kg)   SpO2 98%   BMI 29.68 kg/m  Wt Readings from Last 3 Encounters:  11/18/19 250 lb 4.8 oz (113.5 kg)  11/10/19 251 lb 3.2 oz (113.9 kg)  11/09/19 250 lb (113.4 kg)    Health Maintenance Due  Topic Date Due  . TETANUS/TDAP  Never done  . COLONOSCOPY  Never done    There are no preventive care reminders to display for this patient.   No results found for: TSH Lab Results  Component Value Date   WBC 6.7 10/14/2019   HGB 16.3 10/14/2019   HCT 47.1 10/14/2019   MCV 94.4 10/14/2019   PLT 283 10/14/2019   Lab Results  Component Value Date   NA 139 11/10/2019   K 4.8 11/10/2019   CO2 25 11/10/2019   GLUCOSE 90 11/10/2019   BUN 12 11/10/2019   CREATININE 1.33 (H) 11/10/2019   BILITOT 0.8 10/14/2019   ALKPHOS 92 06/28/2019   AST 28 10/14/2019   ALT 24 10/14/2019   PROT 7.5 10/14/2019   ALBUMIN 4.1 06/28/2019   CALCIUM 9.8 11/10/2019   ANIONGAP 9 06/28/2019   Lab Results  Component Value Date   CHOL 124 05/10/2019   Lab Results  Component Value Date   HDL 54 05/10/2019   Lab Results  Component Value Date   LDLCALC 61 05/10/2019   Lab Results  Component Value Date   TRIG 31 05/10/2019   Lab Results  Component Value Date   CHOLHDL 2.3 05/10/2019   Lab Results  Component Value Date   HGBA1C 5.7 (H) 05/10/2019       Assessment & Plan:   Problem List Items Addressed This Visit      Cardiovascular and Mediastinum   Hypertension    Patient presents after some confusion with his medications. He had accidentally been taking both his old HCTZ prescription and his new one, which was 50 mg in total. He had been experiencing headaches and fatigue since his last office visit and therefore stopped the medication as  of yesterday.   Discussed with patient that with just taking the HCTZ 25 mg alone, he was not quite at the goal of 130/80. Given his previous swelling on amlodipine, will try  combination pill with ARB.  He has some baseline CKD so will bring him back for repeat labs and blood pressure in 2 weeks.       Relevant Medications   olmesartan-hydrochlorothiazide (BENICAR HCT) 40-25 MG tablet       Meds ordered this encounter  Medications  . olmesartan-hydrochlorothiazide (BENICAR HCT) 40-25 MG tablet    Sig: Take 1 tablet by mouth daily.    Dispense:  30 tablet    Refill:  0     Eliseo Withers D Yoshi Vicencio, DO

## 2019-11-18 NOTE — Assessment & Plan Note (Signed)
Patient presents after some confusion with his medications. He had accidentally been taking both his old HCTZ prescription and his new one, which was 50 mg in total. He had been experiencing headaches and fatigue since his last office visit and therefore stopped the medication as of yesterday.   Discussed with patient that with just taking the HCTZ 25 mg alone, he was not quite at the goal of 130/80. Given his previous swelling on amlodipine, will try combination pill with ARB.  He has some baseline CKD so will bring him back for repeat labs and blood pressure in 2 weeks.

## 2019-11-18 NOTE — Patient Instructions (Addendum)
[name], Thank you for coming back in for medication clarification.   From now on, STOP both HCTZ pills Start the combination pill tomorrow of HCTZ-Olmesartan once daily Continue taking the atorvastatin daily to decrease long-term risk of heart attacks and strokes.   We will see you back in 2 weeks to repeat labs and check in on your blood pressure.   Take care! Dr. Koleen Distance

## 2019-11-19 NOTE — Progress Notes (Signed)
Internal Medicine Clinic Attending  Case discussed with Dr. Bloomfield  At the time of the visit.  We reviewed the resident's history and exam and pertinent patient test results.  I agree with the assessment, diagnosis, and plan of care documented in the resident's note.  

## 2019-11-30 DIAGNOSIS — N401 Enlarged prostate with lower urinary tract symptoms: Secondary | ICD-10-CM | POA: Diagnosis not present

## 2019-11-30 DIAGNOSIS — R3914 Feeling of incomplete bladder emptying: Secondary | ICD-10-CM | POA: Diagnosis not present

## 2019-11-30 DIAGNOSIS — R3912 Poor urinary stream: Secondary | ICD-10-CM | POA: Diagnosis not present

## 2019-11-30 DIAGNOSIS — R350 Frequency of micturition: Secondary | ICD-10-CM | POA: Diagnosis not present

## 2019-12-06 ENCOUNTER — Encounter: Payer: Self-pay | Admitting: Internal Medicine

## 2019-12-06 ENCOUNTER — Other Ambulatory Visit: Payer: Self-pay

## 2019-12-06 ENCOUNTER — Ambulatory Visit: Payer: Medicaid Other | Admitting: Internal Medicine

## 2019-12-06 VITALS — BP 121/91 | HR 102 | Temp 98.1°F | Ht 77.0 in | Wt 252.9 lb

## 2019-12-06 DIAGNOSIS — N401 Enlarged prostate with lower urinary tract symptoms: Secondary | ICD-10-CM

## 2019-12-06 DIAGNOSIS — R351 Nocturia: Secondary | ICD-10-CM

## 2019-12-06 DIAGNOSIS — I10 Essential (primary) hypertension: Secondary | ICD-10-CM

## 2019-12-06 MED ORDER — OLMESARTAN MEDOXOMIL-HCTZ 40-25 MG PO TABS: 1.0000 | ORAL_TABLET | Freq: Every day | ORAL | 2 refills | Status: DC

## 2019-12-06 NOTE — Patient Instructions (Signed)
°  1. Primary hypertension Your blood pressure is at goal today. BP: (!) 121/91 -I sent a refill for the blood pressure medication to your pharmacy. - BMP8+Anion Gap - olmesartan-hydrochlorothiazide (BENICAR HCT) 40-25 MG tablet; Take 1 tablet by mouth daily.  Dispense: 60 tablet; Refill: 2  2. Benign prostatic hyperplasia with nocturia Have your urologist at [facility] urology send your records over to the office after he completes his evaluation.  Thank you  -My best Dr. Court Joy

## 2019-12-06 NOTE — Progress Notes (Signed)
   CC: High blood pressure  HPI:[name] is a 59 y.o. male who presents for evaluation of high blood pressure. Please see individual problem based A/P for details.   Past Medical History:  Diagnosis Date  . Enlarged prostate   . Essential hypertension   . Gout   . HIV infection ([place])   . Prediabetes    Review of Systems:   Review of Systems  Cardiovascular: Negative for chest pain and leg swelling.  Genitourinary: Negative for dysuria and hematuria.     Physical Exam: Vitals:   12/06/19 1353  BP: (!) 128/94  Pulse: 100  Temp: 98.1 F (36.7 C)  TempSrc: Oral  SpO2: 97%  Weight: 252 lb 14.4 oz (114.7 kg)  Height: 6\' 5"  (1.956 m)     General: NAD, nl appearance HEENT: Normocephalic, atraumatic , Conjunctiva nl  Cardiovascular: Normal rate, regular rhythm.  No murmurs, rubs, or gallops Pulmonary : Equal breath sounds, No wheezes, rales, or rhonchi Abdominal: soft, nontender,  bowel sounds present   Assessment & Plan:   See Encounters Tab for problem based charting.  Patient discussed with Dr. Angelia Mould

## 2019-12-06 NOTE — Assessment & Plan Note (Signed)
HPI: Patient's Flomax was increased 0.8 mg daily at last visit.  This is improved his symptoms and he has followed up with urology.  She reports normal PSA at his urologist.  Enlarged prostate on rectal exam.  He has follow-up with his urologist scheduled for a ultrasound to measure his prostate.  Patient will have records sent to our office so we can follow results.  Denies dysuria or hematuria.  Assessment: BPH Plan: -Continue Flomax 0.8 mg daily -Follow-up with urology for continued work-up

## 2019-12-06 NOTE — Assessment & Plan Note (Signed)
HPI:  Patient's BP today is BP: (!) 121/91 with a goal of <130/80.  Systolic blood pressure at goal.  No evidence to treat diastolic blood pressure .The patient endorses adherence to his medication regimen. He denied, chest pain, headache, visual changes, lightheadedness, weakness, dizziness on standing, swelling in the feet or ankles.   most recent renal function per BMP as below. BMP Latest Ref Rng & Units 11/10/2019 10/14/2019 09/08/2019  Glucose 65 - 99 mg/dL 90 120(H) 95  BUN 8 - 27 mg/dL 12 14 15   Creatinine 0.76 - 1.27 mg/dL 1.33(H) 1.25 1.30(H)  BUN/Creat Ratio 10 - 24 9(L) NOT APPLICABLE 12  Sodium 833 - 144 mmol/L 139 136 138  Potassium 3.5 - 5.2 mmol/L 4.8 4.7 4.7  Chloride 96 - 106 mmol/L 99 103 104  CO2 20 - 29 mmol/L 25 27 27   Calcium 8.6 - 10.2 mg/dL 9.8 9.8 10.1   Medication changes: No  Assessment: Essential Hypertension Plan: -Check BMP today -Olmesartan-hydrochlorothiazide 40-25, refilled for 6 months -Follow-up in 6 months -Counseled on low salt diet

## 2019-12-07 LAB — BMP8+ANION GAP
Anion Gap: 18 mmol/L (ref 10.0–18.0)
BUN/Creatinine Ratio: 9 — ABNORMAL LOW (ref 10–24)
BUN: 13 mg/dL (ref 8–27)
CO2: 23 mmol/L (ref 20–29)
Calcium: 10 mg/dL (ref 8.6–10.2)
Chloride: 101 mmol/L (ref 96–106)
Creatinine, Ser: 1.45 mg/dL — ABNORMAL HIGH (ref 0.76–1.27)
GFR calc Af Amer: 60 mL/min/{1.73_m2} (ref >59)
GFR calc non Af Amer: 52 mL/min/{1.73_m2} — ABNORMAL LOW (ref >59)
Glucose: 95 mg/dL (ref 65–99)
Potassium: 4.9 mmol/L (ref 3.5–5.2)
Sodium: 142 mmol/L (ref 134–144)

## 2019-12-07 NOTE — Telephone Encounter (Signed)
Called and shared results of BMP with patient.

## 2019-12-10 NOTE — Progress Notes (Signed)
Internal Medicine Clinic Attending  Case discussed with Dr. Steen  At the time of the visit.  We reviewed the resident's history and exam and pertinent patient test results.  I agree with the assessment, diagnosis, and plan of care documented in the resident's note.  

## 2019-12-13 ENCOUNTER — Other Ambulatory Visit: Payer: Self-pay | Admitting: Internal Medicine

## 2019-12-13 ENCOUNTER — Telehealth: Payer: Self-pay

## 2019-12-13 NOTE — Telephone Encounter (Signed)
John with [facility] requesting to speak with a nurse, please call back.

## 2019-12-13 NOTE — Telephone Encounter (Signed)
Returned call to [employer] at [facility]. States he has Rx for HCTZ and one for olmesartan-HCTZ. Wanted to know which one is correct. HCTZ alone is no longer on current med list. Rx for combo sent on 12/06/2019. This is the most current Rx. Hubbard Hartshorn, BSN, RN-BC

## 2019-12-13 NOTE — Telephone Encounter (Signed)
Called pharmacy and clarified he should be on olmesartan-hctz. Hctz alone is no longer on his med list.

## 2019-12-18 ENCOUNTER — Other Ambulatory Visit: Payer: Self-pay | Admitting: Internal Medicine

## 2019-12-20 ENCOUNTER — Other Ambulatory Visit: Payer: Self-pay

## 2019-12-20 MED ORDER — OMEPRAZOLE 20 MG PO CPDR
20.0000 mg | DELAYED_RELEASE_CAPSULE | Freq: Every day | ORAL | 1 refills | Status: DC
Start: 2019-12-20 — End: 2020-05-24

## 2019-12-20 MED ORDER — TAMSULOSIN HCL 0.4 MG PO CAPS
0.8000 mg | ORAL_CAPSULE | Freq: Every day | ORAL | 1 refills | Status: DC
Start: 2019-12-20 — End: 2020-03-22

## 2019-12-20 NOTE — Telephone Encounter (Signed)
Requesting to speak with a nurse about meds, please call pt back.  

## 2019-12-21 ENCOUNTER — Encounter: Payer: Self-pay | Admitting: Internal Medicine

## 2019-12-21 ENCOUNTER — Ambulatory Visit: Payer: Medicaid Other | Admitting: Internal Medicine

## 2019-12-21 VITALS — BP 130/90 | HR 96 | Temp 98.0°F | Wt 256.5 lb

## 2019-12-21 DIAGNOSIS — J0140 Acute pansinusitis, unspecified: Secondary | ICD-10-CM | POA: Diagnosis not present

## 2019-12-21 DIAGNOSIS — J019 Acute sinusitis, unspecified: Secondary | ICD-10-CM | POA: Insufficient documentation

## 2019-12-21 DIAGNOSIS — J329 Chronic sinusitis, unspecified: Secondary | ICD-10-CM | POA: Insufficient documentation

## 2019-12-21 MED ORDER — FLUTICASONE PROPIONATE 50 MCG/ACT NA SUSP: 1.0000 | Freq: Every day | NASAL | 1 refills | Status: DC | PRN

## 2019-12-21 MED ORDER — AMOXICILLIN-POT CLAVULANATE 875-125 MG PO TABS: 1.0000 | ORAL_TABLET | Freq: Two times a day (BID) | ORAL | 0 refills | Status: AC

## 2019-12-21 NOTE — Assessment & Plan Note (Signed)
HPI: Patient started to have a headache and congestion approximately 12 days ago.  He has been tearing out some drywall and a subfloor due to an air conditioner leak. Reports some mildew underneath the sheeting. He notices a headache over his right eye when he lays down at night.  Headache is worse if he lays on his left side or leans forward. He has congestion and had some blood tinged rhinorrhea.    Assessment:  Acute non-recurrent pansinusitis. Considered increased ICP but given patient's HIV is well controlled this is not likely cryptococcal infection. Given patient is having severe symptoms and has tried conservative therapy at home for 12 days will start antibiotics.  In addition , Flonase to help with congestion. Plan: - amoxicillin-clavulanate (AUGMENTIN) 875-125 MG tablet; Take 1 tablet by mouth every 12 (twelve) hours for 7 days.  Dispense: 14 tablet; Refill: 0 - fluticasone (FLONASE) 50 MCG/ACT nasal spray; Place 1 spray into both nostrils daily as needed for allergies or rhinitis.  Dispense: 15.8 mL; Refill: 1

## 2019-12-21 NOTE — Patient Instructions (Signed)
Thank you, [name] for allowing [place] to provide your care today. Today we discussed sinus pain.    I have ordered the following labs for you:  Lab Orders  No laboratory test(s) ordered today     Tests ordered today:  none  Referrals ordered today:   Referral Orders  No referral(s) requested today     I have ordered the following medication/changed the following medications:   Stop the following medications: There are no discontinued medications.   Start the following medications: Meds ordered this encounter  Medications  . amoxicillin-clavulanate (AUGMENTIN) 875-125 MG tablet    Sig: Take 1 tablet by mouth every 12 (twelve) hours for 7 days.    Dispense:  14 tablet    Refill:  0  . fluticasone (FLONASE) 50 MCG/ACT nasal spray    Sig: Place 1 spray into both nostrils daily as needed for allergies or rhinitis.    Dispense:  15.8 mL    Refill:  1     Follow up: If sinus headache doesnt resolve or worsens.     Remember: Take the Flonase as needed for allergies and sinus congestion. Finish the antibiotic even if symptoms start to get better.   Should you have any questions or concerns please call the internal medicine clinic at [phone].      Chad Little, M.D. [place]

## 2019-12-21 NOTE — Progress Notes (Signed)
   CC: headache and congestion  HPI:Mr.AKSHAJ BESANCON is a 59 y.o. male who presents for evaluation of headache and congestion. Please see individual problem based A/P for details.  Past Medical History:  Diagnosis Date  . Enlarged prostate   . Essential hypertension   . Gout   . HIV infection ([place])   . Prediabetes    Review of Systems:   Review of Systems  Constitutional: Negative for chills and fever.  HENT: Positive for congestion and sore throat. Negative for sinus pain.   Eyes: Negative for blurred vision, double vision, discharge and redness.  Respiratory: Negative for cough and shortness of breath.      Physical Exam: Vitals:   12/21/19 1433  BP: 130/90  Pulse: 96  Temp: 98 F (36.7 C)  TempSrc: Oral  SpO2: 97%  Weight: 256 lb 8 oz (116.3 kg)   General: NAD, nl appearance HEENT: Normocephalic, atraumatic , Conjunctiva nl , no exudate or erythema of pharynx, Bulging tympanic membrane on left ear Cardiovascular: Normal rate, regular rhythm.  No murmurs, rubs, or gallops Pulmonary : Equal breath sounds, No wheezes, rales, or rhonchi Abdominal: soft, nontender,  bowel sounds present   Assessment & Plan:   See Encounters Tab for problem based charting.  Patient discussed with Dr. Angelia Mould

## 2019-12-23 DIAGNOSIS — R3914 Feeling of incomplete bladder emptying: Secondary | ICD-10-CM | POA: Diagnosis not present

## 2019-12-23 DIAGNOSIS — Z125 Encounter for screening for malignant neoplasm of prostate: Secondary | ICD-10-CM | POA: Diagnosis not present

## 2019-12-23 DIAGNOSIS — N401 Enlarged prostate with lower urinary tract symptoms: Secondary | ICD-10-CM | POA: Diagnosis not present

## 2019-12-23 DIAGNOSIS — R3912 Poor urinary stream: Secondary | ICD-10-CM | POA: Diagnosis not present

## 2019-12-23 DIAGNOSIS — R350 Frequency of micturition: Secondary | ICD-10-CM | POA: Diagnosis not present

## 2019-12-29 NOTE — Progress Notes (Signed)
Internal Medicine Clinic Attending  Case discussed with Dr. Steen  At the time of the visit.  We reviewed the resident's history and exam and pertinent patient test results.  I agree with the assessment, diagnosis, and plan of care documented in the resident's note.  

## 2020-01-04 ENCOUNTER — Other Ambulatory Visit: Payer: Self-pay | Admitting: Urology

## 2020-01-06 ENCOUNTER — Telehealth: Payer: Self-pay | Admitting: *Deleted

## 2020-01-06 NOTE — Telephone Encounter (Signed)
Appreciate Dr.Christian offering to see Chad Little. I was able to call him and he wanted to discuss recommendation given by urology. He had poor urine flow on recent study. He has be on Flomax without improvement. Plan is for TURP.

## 2020-01-06 NOTE — Telephone Encounter (Signed)
Pt calls and states HE WILL NOT TALK TO ANYONE BUT DR Court Joy, he trust dr Court Joy and wants him to call him at [phone]

## 2020-01-06 NOTE — Telephone Encounter (Signed)
Unfortunately, Dr. Court Joy is not currently on the clinic rotation. I am happy to assist him should he change his mind. Please let him know the clinic structure and that Dr. Court Joy is only available every 3-4 months. Thank you

## 2020-01-19 ENCOUNTER — Telehealth: Payer: Self-pay

## 2020-01-19 NOTE — Telephone Encounter (Signed)
Need refill on atorvastatin (LIPITOR) 10 MG tablet ;pt contact [place], [address] [facility] [place]

## 2020-01-20 NOTE — Telephone Encounter (Signed)
#  30 with 11 refills sent to [facility] on 10/20/2019. Patient needs to call Pharmacy for refills. Hubbard Hartshorn, BSN, RN-BC

## 2020-02-04 DIAGNOSIS — Z20822 Contact with and (suspected) exposure to covid-19: Secondary | ICD-10-CM | POA: Diagnosis not present

## 2020-02-09 ENCOUNTER — Encounter (HOSPITAL_BASED_OUTPATIENT_CLINIC_OR_DEPARTMENT_OTHER): Payer: Self-pay | Admitting: Urology

## 2020-02-09 ENCOUNTER — Other Ambulatory Visit: Payer: Self-pay

## 2020-02-09 NOTE — Progress Notes (Signed)
Spoke w/ via phone for pre-op interview---pt Lab needs dos----  I stat             Lab results------ekg 06-28-2019 epic COVID test ------02-10-2020 825 Arrive at -------1200 pm 02-14-2020 NPO after MN NO Solid Food.  Clear liquids from MN until---1100 am then npo Medications to take morning of surgery -----omeprazole, atorvastatin Diabetic medication -----n/a Patient Special Instructions -----none Pre-Op special Istructions -----patient given overnight stay instructions including 1 visitor may wait in lobby with mask and wisit in ower room until 800 pm and then go home Patient verbalized understanding of instructions that were given at this phone interview. Patient denies shortness of breath, chest pain, fever, cough at this phone interview.

## 2020-02-10 ENCOUNTER — Other Ambulatory Visit (HOSPITAL_COMMUNITY)
Admission: RE | Admit: 2020-02-10 | Discharge: 2020-02-10 | Disposition: A | Discharge status: Home / Self Care | Payer: Medicaid Other | Source: Ambulatory Visit | Attending: Urology | Admitting: Urology

## 2020-02-10 DIAGNOSIS — Z01812 Encounter for preprocedural laboratory examination: Secondary | ICD-10-CM | POA: Diagnosis not present

## 2020-02-10 DIAGNOSIS — Z20822 Contact with and (suspected) exposure to covid-19: Secondary | ICD-10-CM | POA: Insufficient documentation

## 2020-02-10 LAB — SARS CORONAVIRUS 2 (TAT 6-24 HRS): SARS Coronavirus 2: NEGATIVE

## 2020-02-14 ENCOUNTER — Encounter (HOSPITAL_BASED_OUTPATIENT_CLINIC_OR_DEPARTMENT_OTHER): Payer: Self-pay | Admitting: Urology

## 2020-02-14 ENCOUNTER — Ambulatory Visit (HOSPITAL_BASED_OUTPATIENT_CLINIC_OR_DEPARTMENT_OTHER)
Admission: RE | Admit: 2020-02-14 | Discharge: 2020-02-15 | Disposition: A | Discharge status: Home / Self Care | Payer: Medicaid Other | Attending: Urology | Admitting: Urology

## 2020-02-14 ENCOUNTER — Encounter (HOSPITAL_BASED_OUTPATIENT_CLINIC_OR_DEPARTMENT_OTHER)
Admission: RE | Disposition: A | Discharge status: Home / Self Care | Payer: Self-pay | Source: Home / Self Care | Attending: Urology

## 2020-02-14 ENCOUNTER — Ambulatory Visit (HOSPITAL_BASED_OUTPATIENT_CLINIC_OR_DEPARTMENT_OTHER): Payer: Medicaid Other | Admitting: Anesthesiology

## 2020-02-14 DIAGNOSIS — R3912 Poor urinary stream: Secondary | ICD-10-CM | POA: Insufficient documentation

## 2020-02-14 DIAGNOSIS — N320 Bladder-neck obstruction: Secondary | ICD-10-CM | POA: Diagnosis not present

## 2020-02-14 DIAGNOSIS — Z823 Family history of stroke: Secondary | ICD-10-CM | POA: Diagnosis not present

## 2020-02-14 DIAGNOSIS — N400 Enlarged prostate without lower urinary tract symptoms: Secondary | ICD-10-CM | POA: Diagnosis not present

## 2020-02-14 DIAGNOSIS — N401 Enlarged prostate with lower urinary tract symptoms: Secondary | ICD-10-CM | POA: Diagnosis not present

## 2020-02-14 DIAGNOSIS — R3914 Feeling of incomplete bladder emptying: Secondary | ICD-10-CM | POA: Diagnosis not present

## 2020-02-14 DIAGNOSIS — Z833 Family history of diabetes mellitus: Secondary | ICD-10-CM | POA: Insufficient documentation

## 2020-02-14 DIAGNOSIS — R350 Frequency of micturition: Secondary | ICD-10-CM | POA: Diagnosis not present

## 2020-02-14 DIAGNOSIS — Z8379 Family history of other diseases of the digestive system: Secondary | ICD-10-CM | POA: Insufficient documentation

## 2020-02-14 DIAGNOSIS — R351 Nocturia: Secondary | ICD-10-CM | POA: Diagnosis not present

## 2020-02-14 DIAGNOSIS — Z8249 Family history of ischemic heart disease and other diseases of the circulatory system: Secondary | ICD-10-CM | POA: Insufficient documentation

## 2020-02-14 DIAGNOSIS — Z79899 Other long term (current) drug therapy: Secondary | ICD-10-CM | POA: Diagnosis not present

## 2020-02-14 DIAGNOSIS — N138 Other obstructive and reflux uropathy: Secondary | ICD-10-CM | POA: Insufficient documentation

## 2020-02-14 DIAGNOSIS — R338 Other retention of urine: Secondary | ICD-10-CM | POA: Diagnosis not present

## 2020-02-14 DIAGNOSIS — K219 Gastro-esophageal reflux disease without esophagitis: Secondary | ICD-10-CM | POA: Diagnosis not present

## 2020-02-14 DIAGNOSIS — I10 Essential (primary) hypertension: Secondary | ICD-10-CM | POA: Diagnosis not present

## 2020-02-14 DIAGNOSIS — M109 Gout, unspecified: Secondary | ICD-10-CM | POA: Diagnosis not present

## 2020-02-14 HISTORY — DX: Gastro-esophageal reflux disease without esophagitis: K21.9

## 2020-02-14 HISTORY — PX: TRANSURETHRAL RESECTION OF PROSTATE: SHX73

## 2020-02-14 HISTORY — DX: Unspecified osteoarthritis, unspecified site: M19.90

## 2020-02-14 LAB — POCT I-STAT, CHEM 8
BUN: 19 mg/dL (ref 8–23)
Calcium, Ion: 1.28 mmol/L (ref 1.15–1.40)
Chloride: 103 mmol/L (ref 98–111)
Creatinine, Ser: 1.3 mg/dL — ABNORMAL HIGH (ref 0.61–1.24)
Glucose, Bld: 104 mg/dL — ABNORMAL HIGH (ref 70–99)
HCT: 53 % — ABNORMAL HIGH (ref 39.0–52.0)
Hemoglobin: 18 g/dL — ABNORMAL HIGH (ref 13.0–17.0)
Potassium: 4.2 mmol/L (ref 3.5–5.1)
Sodium: 140 mmol/L (ref 135–145)
TCO2: 24 mmol/L (ref 22–32)

## 2020-02-14 SURGERY — TURP (TRANSURETHRAL RESECTION OF PROSTATE)
Anesthesia: General | Site: Bladder

## 2020-02-14 MED ORDER — FENTANYL CITRATE (PF) 250 MCG/5ML IJ SOLN
INTRAMUSCULAR | Status: AC
  Filled 2020-02-14: qty 5

## 2020-02-14 MED ORDER — TAMSULOSIN HCL 0.4 MG PO CAPS
ORAL_CAPSULE | ORAL | Status: AC
  Filled 2020-02-14: qty 1

## 2020-02-14 MED ORDER — ONDANSETRON HCL 4 MG/2ML IJ SOLN
INTRAMUSCULAR | Status: AC
  Filled 2020-02-14: qty 2

## 2020-02-14 MED ORDER — OXYCODONE-ACETAMINOPHEN 5-325 MG PO TABS
1.0000 | ORAL_TABLET | ORAL | Status: DC | PRN
  Administered 2020-02-14 (×2): 1 via ORAL
  Administered 2020-02-15 (×2): 2 via ORAL

## 2020-02-14 MED ORDER — BELLADONNA ALKALOIDS-OPIUM 16.2-60 MG RE SUPP
RECTAL | Status: AC
  Filled 2020-02-14: qty 1

## 2020-02-14 MED ORDER — TAMSULOSIN HCL 0.4 MG PO CAPS
0.8000 mg | ORAL_CAPSULE | Freq: Every day | ORAL | Status: DC
Start: 2020-02-14 — End: 2020-02-15
  Administered 2020-02-14: 0.8 mg via ORAL

## 2020-02-14 MED ORDER — BELLADONNA ALKALOIDS-OPIUM 16.2-60 MG RE SUPP: 1.0000 | Freq: Four times a day (QID) | RECTAL | Status: DC | PRN

## 2020-02-14 MED ORDER — LACTATED RINGERS IV SOLN: INTRAVENOUS | Status: DC

## 2020-02-14 MED ORDER — DEXAMETHASONE SODIUM PHOSPHATE 10 MG/ML IJ SOLN
INTRAMUSCULAR | Status: AC
  Filled 2020-02-14: qty 1

## 2020-02-14 MED ORDER — OXYCODONE-ACETAMINOPHEN 5-325 MG PO TABS
ORAL_TABLET | ORAL | Status: AC
  Filled 2020-02-14: qty 1

## 2020-02-14 MED ORDER — OXYCODONE HCL 5 MG PO TABS: 5.0000 mg | ORAL_TABLET | Freq: Once | ORAL | Status: DC | PRN

## 2020-02-14 MED ORDER — ACETAMINOPHEN 325 MG PO TABS: 650.0000 mg | ORAL_TABLET | ORAL | Status: DC | PRN

## 2020-02-14 MED ORDER — OXYBUTYNIN CHLORIDE ER 10 MG PO TB24: 10.0000 mg | ORAL_TABLET | Freq: Every day | ORAL | 0 refills | Status: AC

## 2020-02-14 MED ORDER — IRBESARTAN 300 MG PO TABS
300.0000 mg | ORAL_TABLET | Freq: Every day | ORAL | Status: DC
  Administered 2020-02-14: 300 mg via ORAL
  Filled 2020-02-14 (×2): qty 1

## 2020-02-14 MED ORDER — MIDAZOLAM HCL 5 MG/5ML IJ SOLN
INTRAMUSCULAR | Status: DC | PRN
  Administered 2020-02-14: 2 mg via INTRAVENOUS

## 2020-02-14 MED ORDER — OXYBUTYNIN CHLORIDE ER 10 MG PO TB24
10.0000 mg | ORAL_TABLET | Freq: Every day | ORAL | Status: DC
  Administered 2020-02-14: 10 mg via ORAL

## 2020-02-14 MED ORDER — HYDROCHLOROTHIAZIDE 25 MG PO TABS
25.0000 mg | ORAL_TABLET | Freq: Every day | ORAL | Status: DC
  Filled 2020-02-14 (×2): qty 1

## 2020-02-14 MED ORDER — LIDOCAINE 2% (20 MG/ML) 5 ML SYRINGE
INTRAMUSCULAR | Status: DC | PRN
  Administered 2020-02-14: 100 mg via INTRAVENOUS

## 2020-02-14 MED ORDER — HEPARIN SODIUM (PORCINE) 5000 UNIT/ML IJ SOLN
INTRAMUSCULAR | Status: AC
  Filled 2020-02-14: qty 1

## 2020-02-14 MED ORDER — BACITRACIN-NEOMYCIN-POLYMYXIN 400-5-5000 EX OINT: 1.0000 "application " | TOPICAL_OINTMENT | Freq: Three times a day (TID) | CUTANEOUS | Status: DC | PRN

## 2020-02-14 MED ORDER — PROMETHAZINE HCL 25 MG/ML IJ SOLN: 6.2500 mg | INTRAMUSCULAR | Status: DC | PRN

## 2020-02-14 MED ORDER — MEPERIDINE HCL 25 MG/ML IJ SOLN
6.2500 mg | INTRAMUSCULAR | Status: DC | PRN
Start: 2020-02-14 — End: 2020-02-15

## 2020-02-14 MED ORDER — HYDROMORPHONE HCL 1 MG/ML IJ SOLN: 0.5000 mg | INTRAMUSCULAR | Status: DC | PRN

## 2020-02-14 MED ORDER — SODIUM CHLORIDE 0.9 % IR SOLN
3000.0000 mL | Status: DC
  Administered 2020-02-15: 3000 mL

## 2020-02-14 MED ORDER — DIPHENHYDRAMINE HCL 50 MG/ML IJ SOLN: 12.5000 mg | Freq: Four times a day (QID) | INTRAMUSCULAR | Status: DC | PRN

## 2020-02-14 MED ORDER — BACITRACIN-NEOMYCIN-POLYMYXIN OINTMENT TUBE
TOPICAL_OINTMENT | CUTANEOUS | Status: AC
  Filled 2020-02-14: qty 14.17

## 2020-02-14 MED ORDER — SODIUM CHLORIDE 0.9 % IV SOLN
250.0000 mL | INTRAVENOUS | Status: DC | PRN
Start: 2020-02-15 — End: 2020-02-15
  Administered 2020-02-14: 1000 mL via INTRAVENOUS

## 2020-02-14 MED ORDER — DEXAMETHASONE SODIUM PHOSPHATE 10 MG/ML IJ SOLN
INTRAMUSCULAR | Status: DC | PRN
  Administered 2020-02-14: 10 mg via INTRAVENOUS

## 2020-02-14 MED ORDER — CEFAZOLIN SODIUM-DEXTROSE 2-4 GM/100ML-% IV SOLN
INTRAVENOUS | Status: AC
  Filled 2020-02-14: qty 100

## 2020-02-14 MED ORDER — OLMESARTAN MEDOXOMIL-HCTZ 40-25 MG PO TABS: 1.0000 | ORAL_TABLET | Freq: Every day | ORAL | Status: DC

## 2020-02-14 MED ORDER — FENTANYL CITRATE (PF) 100 MCG/2ML IJ SOLN
INTRAMUSCULAR | Status: DC | PRN
  Administered 2020-02-14 (×5): 50 ug via INTRAVENOUS

## 2020-02-14 MED ORDER — TRIAMCINOLONE ACETONIDE 0.1 % EX OINT
TOPICAL_OINTMENT | Freq: Two times a day (BID) | CUTANEOUS | Status: DC
  Filled 2020-02-14: qty 15

## 2020-02-14 MED ORDER — DIPHENHYDRAMINE HCL 12.5 MG/5ML PO ELIX: 12.5000 mg | ORAL_SOLUTION | Freq: Four times a day (QID) | ORAL | Status: DC | PRN

## 2020-02-14 MED ORDER — SODIUM CHLORIDE 0.9 % IR SOLN
Status: DC | PRN
  Administered 2020-02-14 (×2): 6000 mL via INTRAVESICAL

## 2020-02-14 MED ORDER — POLYETHYLENE GLYCOL 3350 17 G PO PACK: 17.0000 g | PACK | Freq: Every day | ORAL | Status: DC | PRN

## 2020-02-14 MED ORDER — SODIUM CHLORIDE 0.9% FLUSH: 3.0000 mL | Freq: Two times a day (BID) | INTRAVENOUS | Status: DC

## 2020-02-14 MED ORDER — ATORVASTATIN CALCIUM 10 MG PO TABS
10.0000 mg | ORAL_TABLET | Freq: Every day | ORAL | Status: DC
  Filled 2020-02-14: qty 1

## 2020-02-14 MED ORDER — OXYCODONE-ACETAMINOPHEN 5-325 MG PO TABS: 1.0000 | ORAL_TABLET | ORAL | 0 refills | Status: AC | PRN

## 2020-02-14 MED ORDER — KCL IN DEXTROSE-NACL 20-5-0.45 MEQ/L-%-% IV SOLN
INTRAVENOUS | Status: DC
  Administered 2020-02-14: 1 mL via INTRAVENOUS
  Filled 2020-02-14 (×4): qty 1000

## 2020-02-14 MED ORDER — PROPOFOL 10 MG/ML IV BOLUS
INTRAVENOUS | Status: AC
  Filled 2020-02-14: qty 20

## 2020-02-14 MED ORDER — ONDANSETRON HCL 4 MG/2ML IJ SOLN: 4.0000 mg | INTRAMUSCULAR | Status: DC | PRN

## 2020-02-14 MED ORDER — FLUTICASONE PROPIONATE 50 MCG/ACT NA SUSP
1.0000 | Freq: Every day | NASAL | Status: DC | PRN
  Filled 2020-02-14: qty 16

## 2020-02-14 MED ORDER — PROPOFOL 10 MG/ML IV BOLUS
INTRAVENOUS | Status: DC | PRN
  Administered 2020-02-14: 200 mg via INTRAVENOUS
  Administered 2020-02-14 (×2): 40 mg via INTRAVENOUS

## 2020-02-14 MED ORDER — PANTOPRAZOLE SODIUM 40 MG PO TBEC
40.0000 mg | DELAYED_RELEASE_TABLET | Freq: Every day | ORAL | Status: DC
  Filled 2020-02-14: qty 1

## 2020-02-14 MED ORDER — HEPARIN SODIUM (PORCINE) 5000 UNIT/ML IJ SOLN
5000.0000 [IU] | Freq: Three times a day (TID) | INTRAMUSCULAR | Status: DC
  Administered 2020-02-14 – 2020-02-15 (×2): 5000 [IU] via SUBCUTANEOUS

## 2020-02-14 MED ORDER — EMTRICITABINE-TENOFOVIR AF 200-25 MG PO TABS
1.0000 | ORAL_TABLET | Freq: Every day | ORAL | Status: DC
  Administered 2020-02-14: 1 via ORAL
  Filled 2020-02-14 (×2): qty 1

## 2020-02-14 MED ORDER — BISACODYL 10 MG RE SUPP: 10.0000 mg | Freq: Every day | RECTAL | Status: DC | PRN

## 2020-02-14 MED ORDER — SODIUM CHLORIDE 0.9% FLUSH: 3.0000 mL | INTRAVENOUS | Status: DC | PRN

## 2020-02-14 MED ORDER — MIDAZOLAM HCL 2 MG/2ML IJ SOLN
INTRAMUSCULAR | Status: AC
  Filled 2020-02-14: qty 2

## 2020-02-14 MED ORDER — ZOLPIDEM TARTRATE 5 MG PO TABS: 5.0000 mg | ORAL_TABLET | Freq: Every evening | ORAL | Status: DC | PRN

## 2020-02-14 MED ORDER — OXYBUTYNIN CHLORIDE ER 10 MG PO TB24
ORAL_TABLET | ORAL | Status: AC
  Filled 2020-02-14: qty 1

## 2020-02-14 MED ORDER — OXYCODONE HCL 5 MG/5ML PO SOLN: 5.0000 mg | Freq: Once | ORAL | Status: DC | PRN

## 2020-02-14 MED ORDER — HYDROMORPHONE HCL 1 MG/ML IJ SOLN
0.2500 mg | INTRAMUSCULAR | Status: DC | PRN
Start: 2020-02-14 — End: 2020-02-15

## 2020-02-14 MED ORDER — CEFAZOLIN SODIUM-DEXTROSE 2-4 GM/100ML-% IV SOLN
2.0000 g | Freq: Once | INTRAVENOUS | Status: AC
  Administered 2020-02-14: 2 g via INTRAVENOUS

## 2020-02-14 MED ORDER — DOCUSATE SODIUM 100 MG PO CAPS: 100.0000 mg | ORAL_CAPSULE | Freq: Every day | ORAL | 0 refills | Status: AC | PRN

## 2020-02-14 MED ORDER — DOLUTEGRAVIR SODIUM 50 MG PO TABS
50.0000 mg | ORAL_TABLET | Freq: Every day | ORAL | Status: DC
  Administered 2020-02-14: 50 mg via ORAL
  Filled 2020-02-14 (×2): qty 1

## 2020-02-14 MED ORDER — BELLADONNA ALKALOIDS-OPIUM 16.2-60 MG RE SUPP
RECTAL | Status: DC | PRN
  Administered 2020-02-14: 1 via RECTAL

## 2020-02-14 MED ORDER — ONDANSETRON HCL 4 MG/2ML IJ SOLN
INTRAMUSCULAR | Status: DC | PRN
  Administered 2020-02-14: 4 mg via INTRAVENOUS

## 2020-02-14 SURGICAL SUPPLY — 28 items
BAG DRAIN URO-CYSTO SKYTR STRL (DRAIN) ×2 IMPLANT
BAG DRN RND TRDRP ANRFLXCHMBR (UROLOGICAL SUPPLIES) ×1
BAG DRN UROCATH (DRAIN) ×1
BAG URINE DRAIN 2000ML AR STRL (UROLOGICAL SUPPLIES) ×2 IMPLANT
BAG URINE LEG 500ML (DRAIN) IMPLANT
CATH FOLEY 2WAY SLVR 30CC 20FR (CATHETERS) IMPLANT
CATH FOLEY 3WAY 30CC 22FR (CATHETERS) ×2 IMPLANT
CLOTH BEACON ORANGE TIMEOUT ST (SAFETY) ×2 IMPLANT
ELECT BIVAP BIPO 22/24 DONUT (ELECTROSURGICAL)
ELECT REM PT RETURN 15FT ADLT (MISCELLANEOUS) ×2 IMPLANT
ELECTRD BIVAP BIPO 22/24 DONUT (ELECTROSURGICAL) IMPLANT
GLOVE BIO SURGEON STRL SZ7.5 (GLOVE) ×2 IMPLANT
GLOVE SURG ENC MOIS LTX SZ7 (GLOVE) IMPLANT
GOWN STRL REUS W/ TWL LRG LVL3 (GOWN DISPOSABLE) IMPLANT
GOWN STRL REUS W/TWL LRG LVL3 (GOWN DISPOSABLE)
GOWN STRL REUS W/TWL XL LVL3 (GOWN DISPOSABLE) ×4 IMPLANT
HOLDER FOLEY CATH W/STRAP (MISCELLANEOUS) ×2 IMPLANT
IV NS IRRIG 3000ML ARTHROMATIC (IV SOLUTION) ×10 IMPLANT
KIT TURNOVER CYSTO (KITS) ×2 IMPLANT
LOOP CUT BIPOLAR 24F LRG (ELECTROSURGICAL) ×2 IMPLANT
MANIFOLD NEPTUNE II (INSTRUMENTS) ×2 IMPLANT
PACK CYSTO (CUSTOM PROCEDURE TRAY) ×2 IMPLANT
SYR 30ML LL (SYRINGE) ×2 IMPLANT
SYR TOOMEY IRRIG 70ML (MISCELLANEOUS) ×2
SYRINGE TOOMEY IRRIG 70ML (MISCELLANEOUS) ×1 IMPLANT
TUBE CONNECTING 12X1/4 (SUCTIONS) ×2 IMPLANT
TUBING UROLOGY SET (TUBING) ×2 IMPLANT
WATER STERILE IRR 500ML POUR (IV SOLUTION) ×2 IMPLANT

## 2020-02-14 NOTE — Op Note (Signed)
Operative Note  Preoperative diagnosis:  1.  BPH with bladder outlet obstruction  Postoperative diagnosis: 1.  BPH with bladder outlet obstruction  Procedure(s): 1.  Bipolar transurethral resection of prostate  Surgeon: Rexene Alberts, MD  Assistants:  None  Anesthesia:  General  Complications:  None  EBL:  34ml  Specimens: 1. Prostate chips ID Type Source Tests Collected by Time Destination  1 : prostate chips Tissue PATH GU resection / TURBT / partial nephrectomy SURGICAL PATHOLOGY Janith Lima, MD 02/14/2020 1220     Drains/Catheters: 1.  22Fr 3 way catheter with 6ml water into balloon  Intraoperative findings:   1. Bilobar obstructing prostate.  Successful resection of obstructing lobes.  Excellent hemostasis.  Indication:  Chad Little is a 60 y.o. male with BPH with bladder outlet obstruction presenting for transurethral resection of the prostate. He had a TRUS volume of 70mL. Uroflow demonstrated average flow rate of 4.23ml/sec. He has failed alpha blockers. After thorough discussion including all relevant risk benefits and alternatives, he presents today for a bipolar TURP.  Description of procedure: The indication, alternatives, benefits and risks were discussed with the patient and informed consent was obtained.  Patient was brought to the operating room table, positioned supine, secured with a safety strap.  Pneumatic compression devices were placed on the lower extremities.  After the administration of intravenous antibiotics and general anesthesia, the patient was repositioned into the dorsal lithotomy position.  All pressure points were carefully padded.  A rectal examination was performed confirming a smooth symmetric enlarged gland.  The genitalia were prepped and draped in standard sterile manner.  A timeout was completed, verifying the correct patient, surgical procedure and positioning prior to beginning the procedure.  Isotonic sodium chloride was used  for irrigation.  A 26 French continuous-flow resectoscope sheath with the visual obturator and a 30 degree lens was advanced under direct vision into the bladder.  The anterior urethra appeared normal in its entirety.The prostatic urethra demonstrated bilobar hyperplasia.  On cystoscopic evaluation, his bladder capacity appeared normal, the bladder wall was noted to expand symmetrically in all dimensions.  There were no tumors, stones or foreign bodies present. The bladder had 1+ trabeculation with normal-appearing mucosa.  Both ureteral orifices were in their normal anatomic positions with clear urinary reflux noted bilaterally.  The obturator was removed and replaced by the working element with a resection loop.  The location of the ureteral orifices and the prostatic configuration were again confirmed.  Starting at the bladder neck and proceeding distally to the verumontanum a transurethral section of the prostate was performed using bipolar using energy of 4 and 5 for cutting and coagulation, respectively.  The procedure began at the bladder neck at the 5 o'clock and 7 o'clock positions and carefully carried distally to the verumontanum, resecting the intervening prostatic adenoma.  Next the left lateral lobe was resected to the level of the transverse capsular fibers.  The identical procedure was performed on the right lobe.  Attention was then directed anteriorly and the resection was completed from the 10 o'clock to 2 o'clock positions.  All bleeding vessels were fulgurated achieving meticulous hemostasis.  The bladder was irrigated with a Toomey syringe, ensuring removal of all prostate chips which were sent to pathology for evaluation.  Having completed the resection and the chips removed, we again confirmed hemostasis with the loop with coagulating current.  Upon completion of the entire procedure, the bladder and posterior urethra were reexamined, confirming open prostatic urethra and  bladder neck  without evidence of bleeding or perforation.  Both ureteral orifices and the external sphincter were noted to be intact.  The resectoscope was withdrawn under direct vision and a 22 French three-way Foley catheter with a 30 cc balloon was inserted into the bladder.  The balloon was inflated with 30 cc of sterile water.  After multiple manual irrigations ensuring clear return of the irrigant, the procedure was terminated.  The catheter was attached to a drainage bag and continuous bladder irrigation was started with normal saline.  The patient was positioned supine.  At the end of the procedure, all counts were correct.  Patient tolerated the procedure well and was taken to the recovery room satisfactory condition.  Plan: Continuous bladder irrigation overnight.  Plan to discharge home tomorrow with Foley catheter in place and void trial in the office in 3 days.  [facility]  Pager: [phone]

## 2020-02-14 NOTE — Progress Notes (Signed)
Report received from Marcene Corning, RN and patient transferred to [facility] via stretcher. Moved to bed without difficulty.

## 2020-02-14 NOTE — H&P (Signed)
Office Visit Report     12/23/2019   --------------------------------------------------------------------------------   Chad Little  MRN: [id number]  DOB: July 14, 1960, 60 year old Male  SSN:    PRIMARY CARE:    REFERRING:  Chad B. Daryll Drown, MD  PROVIDER:  Rexene Little, M.D.  LOCATION:  [facility] [phone]     --------------------------------------------------------------------------------   CC/HPI: Chad Little is a 60 year old male seen in follow-up today for BPH with LUTS.   He presents for BPH evaluation with TRUS for size, cystoscopy, uroflow and PVR.   He says this been present for over a year with progressively worsening over the past 2 months. He states he has been on tamsulosin 0.4 mg for approximately 1 year. This is recently increased to 0.8 mg by his PCP. He complains of sensation of incomplete bladder emptying, fair flow of stream, urinary frequency, nocturia x4. IPSS score today is 14, QOL 3. He is interested in discussing surgical intervention for his enlarged prostate.   He does report drinking several cups of water a night prior to sleeping, however has changed this habit recently and tries to avoid evening fluids. He denies excessive alcohol or caffeine use in the evening.   His SHIM score is 3. He is currently sexually active.   He denies a family history of prostate cancer. Recent PSA obtained by his PCP was reportedly around 1.0 NG/mL.   TRUS demonstrated prostate volume of 36.5 mL. PVR was 51 mL. Uroflow demonstrated a peak flow rate of 10.4 cc/sec and average flow rate of 4.8 cc/sec. he voided with an obstructive pattern 2 standard deviations below the norm.   Patient currently denies fever, chills, sweats, nausea, vomiting, abdominal or flank pain, gross hematuria or dysuria.     ALLERGIES: NKDA    MEDICATIONS: Tamsulosin Hcl  Atorvastatin Calcium 10 mg tablet  Betamethasone Valerate  Descovy  Emtricitabine-Tenofovir Disop   Olmesartan-Hydrochlorothiazide  Tivicay     GU PSH: None   NON-GU PSH: Appendectomy - 2004     GU PMH: BPH w/LUTS - 11/30/2019 Incomplete bladder emptying - 11/30/2019 Urinary Frequency - 11/30/2019 Weak Urinary Stream - 11/30/2019    NON-GU PMH: Anxiety Arthritis GERD Gout HIV Hypertension    FAMILY HISTORY: blood clots - Sister Diabetes - Sister liver disease - Mother stroke - Father   SOCIAL HISTORY: Marital Status: Single Ethnicity: Not Hispanic Or Latino; Race: Black or African American Current Smoking Status: Patient has never smoked.   Tobacco Use Assessment Completed: Used Tobacco in last 30 days? Has never drank.  Drinks 2 caffeinated drinks per day.    REVIEW OF SYSTEMS:    GU Review Male:   Patient denies frequent urination, hard to postpone urination, burning/ pain with urination, get up at night to urinate, leakage of urine, stream starts and stops, trouble starting your stream, have to strain to urinate , erection problems, and penile pain.  Gastrointestinal (Upper):   Patient denies nausea, vomiting, and indigestion/ heartburn.  Gastrointestinal (Lower):   Patient denies diarrhea and constipation.  Constitutional:   Patient denies fatigue, night sweats, weight loss, and fever.  Skin:   Patient denies skin rash/ lesion and itching.  Eyes:   Patient denies blurred vision and double vision.  Ears/ Nose/ Throat:   Patient denies sore throat and sinus problems.  Hematologic/Lymphatic:   Patient denies swollen glands and easy bruising.  Cardiovascular:   Patient denies leg swelling and chest pains.  Respiratory:   Patient denies cough and shortness of breath.  Endocrine:   Patient denies excessive thirst.  Musculoskeletal:   Patient denies back pain and joint pain.  Neurological:   Patient denies headaches and dizziness.  Psychologic:   Patient denies depression and anxiety.   VITAL SIGNS:      12/23/2019 01:36 PM  Weight 256 lb / 116.12 kg  Height  77 in / 195.58 cm  BP 132/89 mmHg  Pulse 80 /min  Temperature 97.3 F / 36.2 C  BMI 30.4 kg/m   GU PHYSICAL EXAMINATION:    Anus and Perineum: No hemorrhoids. No anal stenosis. No rectal fissure, no anal fissure. No edema, no dimple, no perineal tenderness, no anal tenderness.  Scrotum: No lesions. No edema. No cysts. No warts.  Epididymides: Right: no spermatocele, no masses, no cysts, no tenderness, no induration, no enlargement. Left: no spermatocele, no masses, no cysts, no tenderness, no induration, no enlargement.  Testes: No tenderness, no swelling, no enlargement left testes. No tenderness, no swelling, no enlargement right testes. Normal location left testes. Normal location right testes. No mass, no cyst, no varicocele, no hydrocele left testes. No mass, no cyst, no varicocele, no hydrocele right testes.  Urethral Meatus: Normal size. No lesion, no wart, no discharge, no polyp. Normal location.  Penis: Circumcised, no warts, no cracks. No dorsal Peyronie's plaques, no left corporal Peyronie's plaques, no right corporal Peyronie's plaques, no scarring, no warts. No balanitis, no meatal stenosis.  Prostate: 40 gram or 2+ size. Left lobe normal consistency, right lobe normal consistency. Symmetrical lobes. No prostate nodule. Left lobe no tenderness, right lobe no tenderness.   Seminal Vesicles: Nonpalpable.  Sphincter Tone: Normal sphincter. No rectal tenderness. No rectal mass.    MULTI-SYSTEM PHYSICAL EXAMINATION:    Constitutional: Well-nourished. No physical deformities. Normally developed. Good grooming.  Respiratory: No labored breathing, no use of accessory muscles.   Cardiovascular: Normal temperature, normal extremity pulses, no swelling, no varicosities.  Gastrointestinal: No mass, no tenderness, no rigidity, non obese abdomen.     Complexity of Data:  Source Of History:  Patient, Medical Record Summary  Lab Test Review:   PSA  Records Review:   AUA Symptom Score,  Previous Doctor Records, Previous Patient Records  Urine Test Review:   Urinalysis  Urodynamics Review:   Review Bladder Scan, Review Flow Rate   11/30/19  PSA  Total PSA 1.81 ng/mL    PROCEDURES:         Prostate Ultrasound [id number]  Length:3.80cm Height:3.32 cm Width:5.53 cm Volume:36.52 ml      The transrectal ultrasound probe is introduced into the rectum, and the prostate is visualized. Ultrasonography is utilized throughout the procedure. At the conclusion of the procedure, the ultrasound probe is removed. The patient tolerates the procedure without complication. . Patient confirmed No Neulasta OnPro Device.          Flow Rate - [id number]  Time of Peak Flow: 0:07 min:sec  Peak Flow Rate: 10.4 cc/sec  Flow Time: 0:46 min:sec  Voided Volume: 220 cc  Total Void Time: 1:07 min:sec  Average Flow Rate: 4.8 cc/sec           PVR Ultrasound - [id number]  Scanned Volume: 51 cc   ASSESSMENT:      ICD-10 Details  1 GU:   BPH w/LUTS - N40.1   2   Incomplete bladder emptying - R39.14   3   Urinary Frequency - R35.0   4   Weak Urinary Stream - R39.12   5   Encounter for  Prostate Cancer screening - Z12.5    PLAN:           Document Letter(s):  Created for Patient: Clinical Summary         Notes:   1. BPH: IPSS 14, QOL 3. TRUS volume 36.5 g. PVR 51 mL. Uroflow with peak flow rate of 10.4cc/sec and average flow rate of 4.8 cc/sec. He voided with an obstructive pattern 2 standard deviations below the norm. On tamsulosin 0.8mg . We discussed surgical options including urolift, Rezum, TURP and HoLEP. He elects to proceed with bipolar TURP discussed risks and benefits as below. Surgery letter sent. Will call to schedule.   Risks and benefits of bipolar TURP were reviewed in detail including infection, bleeding, blood transfusion, injury to bladder/urethra/surrounding structures, erectile dysfunction, urinary incontinence, bladder neck contracture, persistent obstructive and irritative voiding  symptoms, and global anesthesia risks including but not limited to CVA, MI, DVT, PE, pneumonia, and death. He expressed understanding and desire to proceed.   2. CaP Screening  DRE benign. PSA on 11/30/2019 normal at 1.81 NG/mL.   CC: Chad Chiquito, MD        Next Appointment:      Next Appointment: 12/23/2019 12:15 PM    Appointment Type: Prostate Ultrasound    Location: [facility] [phone]    Provider: Radiology Rm1 Radiology Rm 1    Reason for Visit: 3 week TURS/cysto/uroflow/pvr ok per tammy       Signed by Chad Little, M.D. on 12/23/19 at 3:08 PM (EST)  Urology Preoperative H&P   Chief Complaint: BPH  History of Present Illness: Chad Little is a 60 y.o. male with BPH here for bipolar TURP. Denies fevers or chills. No dysuria.    Past Medical History:  Diagnosis Date  . Arthritis   . Enlarged prostate   . Essential hypertension   . GERD (gastroesophageal reflux disease)   . Gout    diet controlled no recent flare ups  . HIV infection [facility])     Past Surgical History:  Procedure Laterality Date  . APPENDECTOMY  2005   open  . Right elbow surgery  dec3 2020   in [place]    Allergies: No Known Allergies  Family History  Problem Relation Age of Onset  . Alcohol abuse Mother   . Hypertension Father   . Stroke Father     Social History:  reports that he has never smoked. He has never used smokeless tobacco. He reports previous alcohol use. He reports current drug use. Drugs: , Marijuana, and "Crack" cocaine.  ROS: A complete review of systems was performed.  All systems are negative except for pertinent findings as noted.  Physical Exam:  Vital signs in last 24 hours: Temp:  [97.7 F (36.5 C)] 97.7 F (36.5 C) (01/10 1223) Pulse Rate:  [90] 90 (01/10 1223) Resp:  [16] 16 (01/10 1223) BP: (142)/(91) 142/91 (01/10 1223) SpO2:  [100 %] 100 % (01/10 1223) Weight:  [117.1 kg] 117.1 kg (01/10 1223) Constitutional:  Alert  and oriented, No acute distress Cardiovascular: Regular rate and rhythm Respiratory: Normal respiratory effort, Lungs clear bilaterally GI: Abdomen is soft, nontender, nondistended, no abdominal masses GU: No CVA tenderness Lymphatic: No lymphadenopathy Neurologic: Grossly intact, no focal deficits Psychiatric: Normal mood and affect  Laboratory Data:  No results for input(s): WBC, HGB, HCT, PLT in the last 72 hours.  No results for input(s): NA, K, CL, GLUCOSE, BUN, CALCIUM, CREATININE in the last 72 hours.  Invalid input(s): CO3   No results found for this or any previous visit (from the past 24 hour(s)). Recent Results (from the past 240 hour(s))  SARS CORONAVIRUS 2 (TAT 6-24 HRS) Nasopharyngeal Nasopharyngeal Swab     Status: None   Collection Time: 02/10/20  8:40 AM   Specimen: Nasopharyngeal Swab  Result Value Ref Range Status   SARS Coronavirus 2 NEGATIVE NEGATIVE Final    Comment: (NOTE) SARS-CoV-2 target nucleic acids are NOT DETECTED.  The SARS-CoV-2 RNA is generally detectable in upper and lower respiratory specimens during the acute phase of infection. Negative results do not preclude SARS-CoV-2 infection, do not rule out co-infections with other pathogens, and should not be used as the sole basis for treatment or other patient management decisions. Negative results must be combined with clinical observations, patient history, and epidemiological information. The expected result is Negative.  Fact Sheet for Patients: SugarRoll.be  Fact Sheet for Healthcare Providers: https://www.woods-mathews.com/  This test is not yet approved or cleared by the [place] FDA and  has been authorized for detection and/or diagnosis of SARS-CoV-2 by FDA under an Emergency Use Authorization (EUA). This EUA will remain  in effect (meaning this test can be used) for the duration of the COVID-19 declaration under Se ction 564(b)(1) of the  Act, 21 U.S.C. section 360bbb-3(b)(1), unless the authorization is terminated or revoked sooner.  Performed at [facility], [place] [address], [place], [place] [address]     Renal Function: No results for input(s): CREATININE in the last 168 hours. CrCl cannot be calculated (Patient's most recent lab result is older than the maximum 21 days allowed.).  Radiologic Imaging: No results found.  I independently reviewed the above imaging studies.  Assessment and Plan Chad Little is a 60 y.o. male with BPH here for bipolar TURP. Ok to proceed.   Risks and benefits of bipolar TURP were reviewed in detail including infection, bleeding, blood transfusion, injury to bladder/urethra/surrounding structures, erectile dysfunction, urinary incontinence, bladder neck contracture, persistent obstructive and irritative voiding symptoms, and global anesthesia risks including but not limited to CVA, MI, DVT, PE, pneumonia, and death. He expressed understanding and desire to proceed.   Matt R. Rajvir Ernster MD 02/14/2020, 12:29 PM  [facility] Pager: [phone]

## 2020-02-14 NOTE — Progress Notes (Signed)
1 Day Post-Op Subjective: Pain controlled.  No nausea or emesis.  Tolerating Foley.  Objective: Vital signs in last 24 hours: Temp:  [97.4 F (36.3 C)-98.1 F (36.7 C)] 98.1 F (36.7 C) (01/11 0628) Pulse Rate:  [79-99] 86 (01/11 0628) Resp:  [16-21] 18 (01/11 0628) BP: (115-143)/(81-97) 139/87 (01/11 0628) SpO2:  [88 %-100 %] 93 % (01/11 0628) Weight:  [117.1 kg] 117.1 kg (01/10 1223)  Intake/Output from previous day: 01/10 0701 - 01/11 0700 In: 8735 [P.O.:960; I.V.:1175; IV Piggyback:100] Out: 12601 [Urine:12600; Blood:1] Intake/Output this shift: No intake/output data recorded.  Physical Exam:  General: Alert and oriented CV: RRR Lungs: Clear Abdomen: Soft, ND, NT Ext: NT, No erythema Foley: Clear  Lab Results: Recent Labs    02/14/20 1228 02/15/20 0429  HGB 18.0* 15.8  HCT 53.0* 47.3   BMET Recent Labs    02/14/20 1228 02/15/20 0429  NA 140 136  K 4.2 4.4  CL 103 102  CO2  --  21*  GLUCOSE 104* 162*  BUN 19 14  CREATININE 1.30* 1.23  CALCIUM  --  9.2     Studies/Results: No results found.  Assessment/Plan: 1. BPH s/p bipolar TURP on 02/14/2020 2. PMH of HTN, GERD, HIV  -Pain control as needed -Diet as tolerated -Reviewed labs, appropriate -Discharge home with Foley catheter in place -Void trial in office on Thursday morning -OOB, IS, SQH   LOS: 0 days   Matt R. Perri Aragones MD 02/15/2020, 7:51 AM [facility]  Pager: [phone]

## 2020-02-14 NOTE — Transfer of Care (Signed)
Immediate Anesthesia Transfer of Care Note  Patient: Chad Little  Procedure(s) Performed: TRANSURETHRAL RESECTION OF THE PROSTATE (TURP), BIPOLAR (N/A Bladder)  Patient Location: PACU  Anesthesia Type:General  Level of Consciousness: awake, alert , oriented and patient cooperative  Airway & Oxygen Therapy: Patient Spontanous Breathing with face mask O2  Post-op Assessment: Report given to RN and Post -op Vital signs reviewed and stable  Post vital signs: Reviewed and stable  Last Vitals:  Vitals Value Taken Time  BP 117/81 02/14/20 1349  Temp    Pulse 96 02/14/20 1353  Resp 16 02/14/20 1353  SpO2 94 % 02/14/20 1353  Vitals shown include unvalidated device data.  Last Pain:  Vitals:   02/14/20 1223  TempSrc: Oral  PainSc: 0-No pain      Patients Stated Pain Goal: 6 (49/44/96 [id number])  Complications: No complications documented.

## 2020-02-14 NOTE — Anesthesia Procedure Notes (Signed)
Procedure Name: LMA Insertion Date/Time: 02/14/2020 12:54 PM Performed by: Rogers Blocker, CRNA Pre-anesthesia Checklist: Patient identified, Emergency Drugs available, Suction available and Patient being monitored Patient Re-evaluated:Patient Re-evaluated prior to induction Oxygen Delivery Method: Circle System Utilized Preoxygenation: Pre-oxygenation with 100% oxygen Induction Type: IV induction Ventilation: Mask ventilation without difficulty LMA: LMA inserted LMA Size: 5.0 Number of attempts: 1 Placement Confirmation: positive ETCO2 Tube secured with: Tape Dental Injury: Teeth and Oropharynx as per pre-operative assessment

## 2020-02-14 NOTE — Anesthesia Preprocedure Evaluation (Signed)
Anesthesia Evaluation  Patient identified by MRN, date of birth, ID band Patient awake    Reviewed: Allergy & Precautions, NPO status , Patient's Chart, lab work & pertinent test results  Airway Mallampati: II  TM Distance: >3 FB Neck ROM: Full    Dental  (+) Edentulous Upper, Missing, Partial Lower, Dental Advisory Given   Pulmonary neg pulmonary ROS,    Pulmonary exam normal breath sounds clear to auscultation       Cardiovascular hypertension, Pt. on medications Normal cardiovascular exam Rhythm:Regular Rate:Normal     Neuro/Psych negative neurological ROS     GI/Hepatic GERD  ,(+)     substance abuse  cocaine use and marijuana use,   Endo/Other  negative endocrine ROS  Renal/GU negative Renal ROS     Musculoskeletal  (+) Arthritis ,   Abdominal   Peds  Hematology  (+) HIV,   Anesthesia Other Findings   Reproductive/Obstetrics                           Anesthesia Physical Anesthesia Plan  ASA: III  Anesthesia Plan: General   Post-op Pain Management:    Induction: Intravenous  PONV Risk Score and Plan: 4 or greater and Treatment may vary due to age or medical condition, Midazolam, Ondansetron and Dexamethasone  Airway Management Planned:   Additional Equipment: None  Intra-op Plan:   Post-operative Plan: Extubation in OR  Informed Consent: I have reviewed the patients History and Physical, chart, labs and discussed the procedure including the risks, benefits and alternatives for the proposed anesthesia with the patient or authorized representative who has indicated his/her understanding and acceptance.     Dental advisory given  Plan Discussed with: CRNA  Anesthesia Plan Comments:        Anesthesia Quick Evaluation

## 2020-02-14 NOTE — Discharge Summary (Signed)
Date of admission: 02/14/2020  Date of discharge: 02/15/2020  Admission diagnosis: BPH  Discharge diagnosis: BPH  Secondary diagnoses: none  History and Physical: For full details, please see admission history and physical. Briefly, Chad Little is a 60 y.o. year old patient with BPH.   Hospital Course: He underwent TURP on 02/14/2020. The patient recovered in the usual expected fashion.  He had his diet advanced slowly.  Initially managed with IV pain control, then transitioned to PO meds when he was tolerating oral intake.  His labs were stable throughout the hospital course.  He was discharged to home on POD#1.  At the time of discharge the patient was tolerating a regular diet, passing flatus, ambulating, had adequate pain control and was agreeable to discharge.  Follow up as scheduled.  Foley will remain in place.  He will return to clinic on Thursday morning for void trial.  Laboratory values:  Recent Labs    02/14/20 1228 02/15/20 0429  HGB 18.0* 15.8  HCT 53.0* 47.3   Recent Labs    02/14/20 1228 02/15/20 0429  CREATININE 1.30* 1.23    Disposition: Home  Discharge instruction: The patient was instructed to be ambulatory but told to refrain from heavy lifting, strenuous activity, or driving.   Discharge medications:  Allergies as of 02/15/2020   No Known Allergies     Medication List    TAKE these medications   atorvastatin 10 MG tablet Commonly known as: Lipitor Take 1 tablet (10 mg total) by mouth daily.   betamethasone valerate ointment 0.1 % Commonly known as: VALISONE Apply 1 application topically 2 (two) times daily. What changed:   when to take this  reasons to take this   docusate sodium 100 MG capsule Commonly known as: Colace Take 1 capsule (100 mg total) by mouth daily as needed.   emtricitabine-tenofovir AF 200-25 MG tablet Commonly known as: DESCOVY Take 1 tablet by mouth daily. What changed: when to take this   fluticasone 50  MCG/ACT nasal spray Commonly known as: Flonase Place 1 spray into both nostrils daily as needed for allergies or rhinitis.   olmesartan-hydrochlorothiazide 40-25 MG tablet Commonly known as: BENICAR HCT Take 1 tablet by mouth daily.   omeprazole 20 MG capsule Commonly known as: PRILOSEC Take 1 capsule (20 mg total) by mouth daily.   oxybutynin 10 MG 24 hr tablet Commonly known as: Ditropan XL Take 1 tablet (10 mg total) by mouth daily for 7 doses. Take as needed for bladder spasms while foley in in place. Stop taking the night prior to foley removal.   oxyCODONE-acetaminophen 5-325 MG tablet Commonly known as: Percocet Take 1 tablet by mouth every 4 (four) hours as needed for up to 15 days for severe pain.   tamsulosin 0.4 MG Caps capsule Commonly known as: FLOMAX Take 2 capsules (0.8 mg total) by mouth daily. What changed: when to take this   Tivicay 50 MG tablet Generic drug: dolutegravir Take 1 tablet (50 mg total) by mouth daily. What changed: when to take this       Followup:   Follow-up Information    [facility] On 02/17/2020.   Why: 7:45AM Contact information: [place] [address] [facility]  Pager: [phone]

## 2020-02-15 ENCOUNTER — Encounter (HOSPITAL_BASED_OUTPATIENT_CLINIC_OR_DEPARTMENT_OTHER): Payer: Self-pay | Admitting: Urology

## 2020-02-15 DIAGNOSIS — Z833 Family history of diabetes mellitus: Secondary | ICD-10-CM | POA: Diagnosis not present

## 2020-02-15 DIAGNOSIS — N401 Enlarged prostate with lower urinary tract symptoms: Secondary | ICD-10-CM | POA: Diagnosis not present

## 2020-02-15 DIAGNOSIS — R351 Nocturia: Secondary | ICD-10-CM | POA: Diagnosis not present

## 2020-02-15 DIAGNOSIS — Z79899 Other long term (current) drug therapy: Secondary | ICD-10-CM | POA: Diagnosis not present

## 2020-02-15 DIAGNOSIS — Z8379 Family history of other diseases of the digestive system: Secondary | ICD-10-CM | POA: Diagnosis not present

## 2020-02-15 DIAGNOSIS — R350 Frequency of micturition: Secondary | ICD-10-CM | POA: Diagnosis not present

## 2020-02-15 DIAGNOSIS — Z823 Family history of stroke: Secondary | ICD-10-CM | POA: Diagnosis not present

## 2020-02-15 DIAGNOSIS — R338 Other retention of urine: Secondary | ICD-10-CM | POA: Diagnosis not present

## 2020-02-15 DIAGNOSIS — N138 Other obstructive and reflux uropathy: Secondary | ICD-10-CM | POA: Diagnosis not present

## 2020-02-15 DIAGNOSIS — Z8249 Family history of ischemic heart disease and other diseases of the circulatory system: Secondary | ICD-10-CM | POA: Diagnosis not present

## 2020-02-15 DIAGNOSIS — R3912 Poor urinary stream: Secondary | ICD-10-CM | POA: Diagnosis not present

## 2020-02-15 DIAGNOSIS — R3914 Feeling of incomplete bladder emptying: Secondary | ICD-10-CM | POA: Diagnosis not present

## 2020-02-15 LAB — BASIC METABOLIC PANEL
Anion gap: 13 (ref 5–15)
BUN: 14 mg/dL (ref 8–23)
CO2: 21 mmol/L — ABNORMAL LOW (ref 22–32)
Calcium: 9.2 mg/dL (ref 8.9–10.3)
Chloride: 102 mmol/L (ref 98–111)
Creatinine, Ser: 1.23 mg/dL (ref 0.61–1.24)
GFR, Estimated: >60 mL/min (ref >60)
Glucose, Bld: 162 mg/dL — ABNORMAL HIGH (ref 70–99)
Potassium: 4.4 mmol/L (ref 3.5–5.1)
Sodium: 136 mmol/L (ref 135–145)

## 2020-02-15 LAB — CBC
HCT: 47.3 % (ref 39.0–52.0)
Hemoglobin: 15.8 g/dL (ref 13.0–17.0)
MCH: 32.4 pg (ref 26.0–34.0)
MCHC: 33.4 g/dL (ref 30.0–36.0)
MCV: 97.1 fL (ref 80.0–100.0)
Platelets: 292 10*3/uL (ref 150–400)
RBC: 4.87 MIL/uL (ref 4.22–5.81)
RDW: 12.8 % (ref 11.5–15.5)
WBC: 13.5 10*3/uL — ABNORMAL HIGH (ref 4.0–10.5)
nRBC: 0 % (ref 0.0–0.2)

## 2020-02-15 LAB — SURGICAL PATHOLOGY

## 2020-02-15 MED ORDER — OXYCODONE-ACETAMINOPHEN 5-325 MG PO TABS
ORAL_TABLET | ORAL | Status: AC
  Filled 2020-02-15: qty 2

## 2020-02-15 NOTE — Discharge Instructions (Signed)
Activity:  You are encouraged to ambulate frequently (about every hour during waking hours) to help prevent blood clots from forming in your legs or lungs.     Diet: You should advance your diet as instructed by your physician.  It will be normal to have some bloating, nausea, and abdominal discomfort intermittently.   Prescriptions:  You will be provided a prescription for pain medication to take as needed.  If your pain is not severe enough to require the prescription pain medication, you may take extra strength Tylenol instead which will have less side effects.  You should also take a prescribed stool softener to avoid straining with bowel movements as the prescription pain medication may constipate you.   What to call us about: You should call the office [phone]) if you develop fever > 101 or develop persistent vomiting. Activity:  You are encouraged to ambulate frequently (about every hour during waking hours) to help prevent blood clots from forming in your legs or lungs.    You have a foley catheter in place. Return to clinic in 3 days for foley removal.    Transurethral Resection of the Prostate, Care After This sheet gives you information about how to care for yourself after your procedure. Your health care provider may also give you more specific instructions. If you have problems or questions, contact your health care provider. What can I expect after the procedure? After the procedure, it is common to have:  Mild pain in your lower abdomen.  Soreness or mild discomfort in your penis from having the catheter inserted during the procedure.  A feeling of urgency when you need to urinate.  A small amount of blood in your urine. You may notice some small blood clots in your urine. These are normal. Follow these instructions at home: Medicines  Take over-the-counter and prescription medicines only as told by your health care provider.  If you were prescribed an  antibiotic medicine, take it as told by your health care provider. Do not stop taking the antibiotic even if you start to feel better.  Ask your health care provider if the medicine prescribed to you: ? Requires you to avoid driving or using heavy machinery. ? Can cause constipation. You may need to take actions to prevent or treat constipation, such as:  Take over-the-counter or prescription medicines.  Eat foods that are high in fiber, such as fresh fruits and vegetables, whole grains, and beans.  Limit foods that are high in fat and processed sugars, such as fried or sweet foods.  Do not drive for 24 hours if you were given a sedative during your procedure. Activity  Return to your normal activities as told by your health care provider. Ask your health care provider what activities are safe for you.  Do not lift anything that is heavier than 10 lb (4.5 kg), or the limit that you are told, for 3 weeks after the procedure or until your health care provider says that it is safe.  Avoid intense physical activity for as long as told by your health care provider.  Avoid sitting for a long time without moving. Get up and move around one or more times every few hours. This helps to prevent blood clots. You may increase your physical activity gradually as you start to feel better.   Lifestyle  Do not drink alcohol for as long as told by your health care provider. This is especially important if you are taking prescription pain medicines.  Do not engage in sexual activity until your health care provider says that you can do this. General instructions  Do not take baths, swim, or use a hot tub until your health care provider approves.  Drink enough fluid to keep your urine pale yellow.  Urinate as soon as you feel the need to. Do not try to hold your urine for long periods of time.  If your health care provider approves, you may take a stool softener for 2-3 weeks to prevent you from  straining to have a bowel movement.  Wear compression stockings as told by your health care provider. These stockings help to prevent blood clots and reduce swelling in your legs.  Keep all follow-up visits as told by your health care provider. This is important.   Contact a health care provider if you have:  Difficulty urinating.  A fever.  Pain that gets worse or does not improve with medicine.  Blood in your urine that does not go away after 1 week of resting and drinking more fluids.  Swelling in your penis or testicles. Get help right away if:  You are unable to urinate.  You are having more blood clots in your urine instead of fewer.  You have: ? Large blood clots. ? A lot of blood in your urine. ? Pain in your back or lower abdomen. ? Pain or swelling in your legs. ? Chills and you are shaking. ? Difficulty breathing or shortness of breath. Summary  After the procedure, it is common to have a small amount of blood in your urine.  Avoid heavy lifting and intense physical activity for as long as told by your health care provider.  Urinate as soon as you feel the need to. Do not try to hold your urine for long periods of time.  Keep all follow-up visits as told by your health care provider. This is important. This information is not intended to replace advice given to you by your health care provider. Make sure you discuss any questions you have with your health care provider. Document Revised: 05/13/2018 Document Reviewed: 10/22/2017 Elsevier Patient Education  2021 [place], Adult An indwelling urinary catheter is a thin tube that is put into your bladder. The tube helps to drain pee (urine) out of your body. The tube goes in through your urethra. Your urethra is where pee comes out of your body. Your pee will come out through the catheter, then it will go into a bag (drainage bag). Take good care of your catheter so it  will work well. How to wear your catheter and bag Supplies needed  Sticky tape (adhesive tape) or a leg strap.  Alcohol wipe or soap and water (if you use tape).  A clean towel (if you use tape).  Large overnight bag.  Smaller bag (leg bag). Wearing your catheter Attach your catheter to your leg with tape or a leg strap.  Make sure the catheter is not pulled tight.  If a leg strap gets wet, take it off and put on a dry strap.  If you use tape to hold the bag on your leg: 1. Use an alcohol wipe or soap and water to wash your skin where the tape made it sticky before. 2. Use a clean towel to pat-dry that skin. 3. Use new tape to make the bag stay on your leg. Wearing your bags You should have been given a large overnight bag.  You may wear the overnight bag in the day or night.  Always have the overnight bag lower than your bladder.  Do not let the bag touch the floor.  Before you go to sleep, put a clean plastic bag in a wastebasket. Then hang the overnight bag inside the wastebasket. You should also have a smaller leg bag that fits under your clothes.  Always wear the leg bag below your knee.  Do not wear your leg bag at night. How to care for your skin and catheter Supplies needed  A clean washcloth.  Water and mild soap.  A clean towel. Caring for your skin and catheter  Clean the skin around your catheter every day: 1. Wash your hands with soap and water. 2. Wet a clean washcloth in warm water and mild soap. 3. Clean the skin around your urethra.  If you are male:  Gently spread the folds of skin around your vagina (labia).  With the washcloth in your other hand, wipe the inner side of your labia on each side. Wipe from front to back.  If you are male:  Pull back any skin that covers the end of your penis (foreskin).  With the washcloth in your other hand, wipe your penis in small circles. Start wiping at the tip of your penis, then move away from  the catheter.  Move the foreskin back in place, if needed. 4. With your free hand, hold the catheter close to where it goes into your body.  Keep holding the catheter during cleaning so it does not get pulled out. 5. With the washcloth in your other hand, clean the catheter.  Only wipe downward on the catheter.  Do not wipe upward toward your body. Doing this may push germs into your urethra and cause infection. 6. Use a clean towel to pat-dry the catheter and the skin around it. Make sure to wipe off all soap. 7. Wash your hands with soap and water.  Shower every day. Do not take baths.  Do not use cream, ointment, or lotion on the area where the catheter goes into your body, unless your doctor tells you to.  Do not use powders, sprays, or lotions on your genital area.  Check your skin around the catheter every day for signs of infection. Check for: ? Redness, swelling, or pain. ? Fluid or blood. ? Warmth. ? Pus or a bad smell.      How to empty the bag Supplies needed  Rubbing alcohol.  Gauze pad or cotton ball.  Tape or a leg strap. Emptying the bag Pour the pee out of your bag when it is ?- full, or at least 2-3 times a day. Do this for your overnight bag and your leg bag. 1. Wash your hands with soap and water. 2. Separate (detach) the bag from your leg. 3. Hold the bag over the toilet or a clean pail. Keep the bag lower than your hips and bladder. This is so the pee (urine) does not go back into the tube. 4. Open the pour spout. It is at the bottom of the bag. 5. Empty the pee into the toilet or pail. Do not let the pour spout touch any surface. 6. Put rubbing alcohol on a gauze pad or cotton ball. 7. Use the gauze pad or cotton ball to clean the pour spout. 8. Close the pour spout. 9. Attach the bag to your leg with tape or a leg strap. 10. Wash your hands with  soap and water. Follow instructions for cleaning the drainage bag:  From the product maker.  As  told by your doctor. How to change the bag Supplies needed  Alcohol wipes.  A clean bag.  Tape or a leg strap. Changing the bag Replace your bag when it starts to leak, smell bad, or look dirty. 1. Wash your hands with soap and water. 2. Separate the dirty bag from your leg. 3. Pinch the catheter with your fingers so that pee does not spill out. 4. Separate the catheter tube from the bag tube where these tubes connect (at the connection valve). Do not let the tubes touch any surface. 5. Clean the end of the catheter tube with an alcohol wipe. Use a different alcohol wipe to clean the end of the bag tube. 6. Connect the catheter tube to the tube of the clean bag. 7. Attach the clean bag to your leg with tape or a leg strap. Do not make the bag tight on your leg. 8. Wash your hands with soap and water. General rules  Never pull on your catheter. Never try to take it out. Doing that can hurt you.  Always wash your hands before and after you touch your catheter or bag. Use a mild, fragrance-free soap. If you do not have soap and water, use hand sanitizer.  Always make sure there are no twists or bends (kinks) in the catheter tube.  Always make sure there are no leaks in the catheter or bag.  Drink enough fluid to keep your pee pale yellow.  Do not take baths, swim, or use a hot tub.  If you are male, wipe from front to back after you poop (have a bowel movement).   Contact a doctor if:  Your pee is cloudy.  Your pee smells worse than usual.  Your catheter gets clogged.  Your catheter leaks.  Your bladder feels full. Get help right away if:  You have redness, swelling, or pain where the catheter goes into your body.  You have fluid, blood, pus, or a bad smell coming from the area where the catheter goes into your body.  Your skin feels warm where the catheter goes into your body.  You have a fever.  You have pain in your: ? Belly (abdomen). ? Legs. ? Lower  back. ? Bladder.  You see blood in the catheter.  Your pee is pink or red.  You feel sick to your stomach (nauseous).  You throw up (vomit).  You have chills.  Your pee is not draining into the bag.  Your catheter gets pulled out. Summary  An indwelling urinary catheter is a thin tube that is placed into the bladder to help drain pee (urine) out of the body.  The catheter is placed into the part of the body that drains pee from the bladder (urethra).  Taking good care of your catheter will keep it working properly and help prevent problems.  Always wash your hands before and after touching your catheter or bag.  Never pull on your catheter or try to take it out. This information is not intended to replace advice given to you by your health care provider. Make sure you discuss any questions you have with your health care provider. Document Revised: 05/15/2018 Document Reviewed: 09/06/2016 Elsevier Patient Education  [place].

## 2020-02-17 DIAGNOSIS — N401 Enlarged prostate with lower urinary tract symptoms: Secondary | ICD-10-CM | POA: Diagnosis not present

## 2020-02-17 DIAGNOSIS — R3914 Feeling of incomplete bladder emptying: Secondary | ICD-10-CM | POA: Diagnosis not present

## 2020-02-28 NOTE — Anesthesia Postprocedure Evaluation (Signed)
Anesthesia Post Note  Patient: Chad Little  Procedure(s) Performed: TRANSURETHRAL RESECTION OF THE PROSTATE (TURP), BIPOLAR (N/A Bladder)     Patient location during evaluation: PACU Anesthesia Type: General Level of consciousness: sedated and patient cooperative Pain management: pain level controlled Vital Signs Assessment: post-procedure vital signs reviewed and stable Respiratory status: spontaneous breathing Cardiovascular status: stable Anesthetic complications: no   No complications documented.  Last Vitals:  Vitals:   02/15/20 0628 02/15/20 0839  BP: 139/87 (!) 141/93  Pulse: 86 91  Resp: 18 18  Temp: 36.7 C 36.7 C  SpO2: 93% 96%    Last Pain:  Vitals:   02/15/20 0815  TempSrc:   PainSc: [place]

## 2020-03-13 ENCOUNTER — Other Ambulatory Visit: Payer: Self-pay

## 2020-03-13 ENCOUNTER — Other Ambulatory Visit: Payer: Medicaid Other

## 2020-03-13 DIAGNOSIS — B20 Human immunodeficiency virus [HIV] disease: Secondary | ICD-10-CM

## 2020-03-13 DIAGNOSIS — Z113 Encounter for screening for infections with a predominantly sexual mode of transmission: Secondary | ICD-10-CM

## 2020-03-14 LAB — T-HELPER CELL (CD4) - (RCID CLINIC ONLY)
CD4 % Helper T Cell: 36 % (ref 33–65)
CD4 T Cell Abs: 613 /uL (ref 400–1790)

## 2020-03-17 LAB — COMPREHENSIVE METABOLIC PANEL
AG Ratio: 1.2 (calc) (ref 1.0–2.5)
ALT: 22 U/L (ref 9–46)
AST: 24 U/L (ref 10–35)
Albumin: 4.3 g/dL (ref 3.6–5.1)
Alkaline phosphatase (APISO): 118 U/L (ref 35–144)
BUN/Creatinine Ratio: 11 (calc) (ref 6–22)
BUN: 15 mg/dL (ref 7–25)
CO2: 28 mmol/L (ref 20–32)
Calcium: 10.3 mg/dL (ref 8.6–10.3)
Chloride: 102 mmol/L (ref 98–110)
Creat: 1.39 mg/dL — ABNORMAL HIGH (ref 0.70–1.25)
Globulin: 3.5 g/dL (calc) (ref 1.9–3.7)
Glucose, Bld: 107 mg/dL — ABNORMAL HIGH (ref 65–99)
Potassium: 4.5 mmol/L (ref 3.5–5.3)
Sodium: 140 mmol/L (ref 135–146)
Total Bilirubin: 0.6 mg/dL (ref 0.2–1.2)
Total Protein: 7.8 g/dL (ref 6.1–8.1)

## 2020-03-17 LAB — HIV-1 RNA QUANT-NO REFLEX-BLD
HIV 1 RNA Quant: 41 Copies/mL — ABNORMAL HIGH
HIV-1 RNA Quant, Log: 1.61 Log cps/mL — ABNORMAL HIGH

## 2020-03-17 LAB — CBC
HCT: 49 % (ref 38.5–50.0)
Hemoglobin: 17 g/dL (ref 13.2–17.1)
MCH: 31.9 pg (ref 27.0–33.0)
MCHC: 34.7 g/dL (ref 32.0–36.0)
MCV: 91.9 fL (ref 80.0–100.0)
MPV: 10.2 fL (ref 7.5–12.5)
Platelets: 310 10*3/uL (ref 140–400)
RBC: 5.33 10*6/uL (ref 4.20–5.80)
RDW: 12.5 % (ref 11.0–15.0)
WBC: 6.8 10*3/uL (ref 3.8–10.8)

## 2020-03-17 LAB — RPR: RPR Ser Ql: NONREACTIVE

## 2020-03-22 ENCOUNTER — Ambulatory Visit: Payer: Medicaid Other | Admitting: Internal Medicine

## 2020-03-22 ENCOUNTER — Other Ambulatory Visit: Payer: Self-pay

## 2020-03-22 ENCOUNTER — Encounter: Payer: Self-pay | Admitting: Internal Medicine

## 2020-03-22 VITALS — BP 126/97 | HR 90 | Temp 98.1°F | Ht 77.0 in | Wt 260.3 lb

## 2020-03-22 DIAGNOSIS — I10 Essential (primary) hypertension: Secondary | ICD-10-CM | POA: Diagnosis not present

## 2020-03-22 DIAGNOSIS — R7303 Prediabetes: Secondary | ICD-10-CM

## 2020-03-22 DIAGNOSIS — Z9079 Acquired absence of other genital organ(s): Secondary | ICD-10-CM

## 2020-03-22 LAB — POCT GLYCOSYLATED HEMOGLOBIN (HGB A1C): Hemoglobin A1C: 5.8 % — AB (ref 4.0–5.6)

## 2020-03-22 LAB — GLUCOSE, CAPILLARY: Glucose-Capillary: 105 mg/dL — ABNORMAL HIGH (ref 70–99)

## 2020-03-22 NOTE — Progress Notes (Signed)
Internal Medicine Clinic Attending  Case discussed with Dr. Steen  At the time of the visit.  We reviewed the resident's history and exam and pertinent patient test results.  I agree with the assessment, diagnosis, and plan of care documented in the resident's note.  

## 2020-03-22 NOTE — Assessment & Plan Note (Signed)
BPH status post TURP with Dr. Rexene Alberts 02/2020. Stopped Flomax.   No complications with procedure. He had intercourse one week ago and reports sensation with no ejaculation. We discussed the most common complication after TURP is retrograde ejaculation. This should not cause any problems.  No further work-up needed at this time.

## 2020-03-22 NOTE — Assessment & Plan Note (Signed)
BP Readings from Last 3 Encounters:  03/22/20 (!) 126/97  02/15/20 (!) 141/93  12/21/19 130/90    Hypertension: Patient's BP today is 126/97 with a goal of <140/80. The patient endorses adherence to his medication regimen.  Plan: Encourage lifestyle, weight loss and low-sodium diet. Continue olmesartan-hydrochlorothiazide 40-25

## 2020-03-22 NOTE — Patient Instructions (Signed)
Thank you, [name] for allowing [place] to provide your care today. Today we discussed decreased semen , controlled hypertension, screening for hemoglobin a1c and painful vein.    I have ordered the following labs for you:  Lab Orders  No laboratory test(s) ordered today     Tests ordered today:    Referrals ordered today:   Referral Orders  No referral(s) requested today     I have ordered the following medication/changed the following medications:   Stop the following medications: Medications Discontinued During This Encounter  Medication Reason  . tamsulosin (FLOMAX) 0.4 MG CAPS capsule Discontinued by provider     Start the following medications: No orders of the defined types were placed in this encounter.    Follow up: 3 months    Remember: I will call you with results of your test.   Should you have any questions or concerns please call the internal medicine clinic at [phone].      Tamsen Snider, M.D. [place]

## 2020-03-22 NOTE — Progress Notes (Signed)
   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 Please see individual problem based A/P for details.   Past Medical History:  Diagnosis Date  . Arthritis   . Enlarged prostate   . Essential hypertension   . GERD (gastroesophageal reflux disease)   . Gout    diet controlled no recent flare ups  . HIV infection ([place])    Review of Systems:   Review of Systems  Constitutional: Negative for chills and fever.  Respiratory: Negative for cough and shortness of breath.   Cardiovascular: Negative for chest pain and leg swelling.  Genitourinary: Negative for dysuria, flank pain, frequency, hematuria and urgency.     Physical Exam: Vitals:   03/22/20 0933  BP: (!) 126/97  Pulse: 90  Temp: 98.1 F (36.7 C)  TempSrc: Oral  SpO2: 96%  Weight: 260 lb 4.8 oz (118.1 kg)  Height: 6\' 5"  (1.956 m)   General: NAD, well kept HEENT: Normocephalic, atraumatic , Conjunctiva nl  Cardiovascular: Normal rate, regular rhythm.  No murmurs, rubs, or gallops Pulmonary : Equal breath sounds, No wheezes, rales, or rhonchi Abdominal: soft, nontender,  bowel sounds present Ext: Superficial vein on left leg tender to palpation, no warmth or swelling   Assessment & Plan:   See Encounters Tab for problem based charting.  Patient discussed with Dr. Evette Doffing

## 2020-03-22 NOTE — Assessment & Plan Note (Signed)
Lab Results  Component Value Date   HGBA1C 5.8 (A) 03/22/2020  ]  Assessment: Prediabetes Plan: Continue dietary recommendations.

## 2020-03-27 ENCOUNTER — Encounter: Payer: Self-pay | Admitting: Family

## 2020-03-27 ENCOUNTER — Telehealth: Payer: Self-pay | Admitting: Internal Medicine

## 2020-03-27 ENCOUNTER — Other Ambulatory Visit: Payer: Self-pay

## 2020-03-27 ENCOUNTER — Ambulatory Visit (INDEPENDENT_AMBULATORY_CARE_PROVIDER_SITE_OTHER): Payer: Medicaid Other | Admitting: Family

## 2020-03-27 DIAGNOSIS — Z0000 Encounter for general adult medical examination without abnormal findings: Secondary | ICD-10-CM | POA: Diagnosis not present

## 2020-03-27 DIAGNOSIS — B20 Human immunodeficiency virus [HIV] disease: Secondary | ICD-10-CM

## 2020-03-27 MED ORDER — TIVICAY 50 MG PO TABS
50.0000 mg | ORAL_TABLET | Freq: Every day | ORAL | 5 refills | Status: DC
Start: 2020-03-27 — End: 2020-07-24

## 2020-03-27 MED ORDER — EMTRICITABINE-TENOFOVIR AF 200-25 MG PO TABS: 1.0000 | ORAL_TABLET | Freq: Every day | ORAL | 5 refills | Status: DC

## 2020-03-27 NOTE — Assessment & Plan Note (Signed)
Mr. Beltran continues to have well-controlled HIV disease with good adherence and tolerance to his ART regimen of Tivicay and Descovy.  No signs/symptoms of opportunistic infection or progressive HIV disease.  We reviewed lab work and discussed plan of care.  Continue current dose of Tivicay and Descovy.  Plan for follow-up in 4 months or sooner if needed with lab work 1 to 2 weeks prior to appointment.

## 2020-03-27 NOTE — Patient Instructions (Signed)
Nice to see you.  Continue to take your Descovy and Tivicay daily.  Refills have been sent to the pharmacy.  Plan for follow up in 4 months or sooner if needed with lab work 1-2 weeks prior your appointment.  Have a great day and stay safe!

## 2020-03-27 NOTE — Assessment & Plan Note (Signed)
   Routine dental care is up-to-date per recommendations.  Discussed importance of safe sexual practice to reduce risk of STI.  Condoms declined.  Declines tetanus.  All other immunizations are up-to-date per recommendations including Covid.

## 2020-03-27 NOTE — Progress Notes (Signed)
Subjective:    Patient ID: Chad Little, male    DOB: [birth year], 60 y.o.   MRN: [id number]  Chief Complaint  Patient presents with  . Follow-up     HPI:  Chad Little is a 60 y.o. male with HIV disease last seen on 11/09/2019 with well-controlled virus with good adherence and tolerance to his ART regimen of Tivicay and Descovy.  Viral load at the time was undetectable with CD4 count of 596.  Most recent blood work completed on 03/13/2020 with viral load that remains undetectable and CD4 count of 613.  Liver function and electrolytes within normal ranges.  Renal function appears stable between 1.2 and 1.4.  Here today for routine follow-up.  Chad Little continues to take his Tivicay and Descovy daily as prescribed with no adverse side effects or missed doses since his last office visit.  Overall feeling well today with no new concerns/complaints. Denies fevers, chills, night sweats, headaches, changes in vision, neck pain/stiffness, nausea, diarrhea, vomiting, lesions or rashes.  Chad Little has no problems obtaining his medication from the pharmacy.  Denies feelings of being down, depressed, or hopeless recently.  No current recreational or illicit drug use, tobacco use, or alcohol consumption.  Declines condoms.  Routine dental care is up-to-date per recommendations.  Due for tetanus otherwise immunizations are up-to-date per recommendations.   No Known Allergies    Outpatient Medications Prior to Visit  Medication Sig Dispense Refill  . atorvastatin (LIPITOR) 10 MG tablet Take 1 tablet (10 mg total) by mouth daily. 30 tablet 11  . betamethasone valerate ointment (VALISONE) 0.1 % Apply 1 application topically 2 (two) times daily. (Patient taking differently: Apply 1 application topically 2 (two) times daily as needed.) 30 g 0  . fluticasone (FLONASE) 50 MCG/ACT nasal spray Place 1 spray into both nostrils daily as needed for allergies or rhinitis. 15.8 mL 1  .  olmesartan-hydrochlorothiazide (BENICAR HCT) 40-25 MG tablet Take 1 tablet by mouth daily. 60 tablet 2  . omeprazole (PRILOSEC) 20 MG capsule Take 1 capsule (20 mg total) by mouth daily. 90 capsule 1  . dolutegravir (TIVICAY) 50 MG tablet Take 1 tablet (50 mg total) by mouth daily. (Patient taking differently: Take 50 mg by mouth at bedtime.) 30 tablet 5  . emtricitabine-tenofovir AF (DESCOVY) 200-25 MG tablet Take 1 tablet by mouth daily. (Patient taking differently: Take 1 tablet by mouth at bedtime.) 30 tablet 5   No facility-administered medications prior to visit.     Past Medical History:  Diagnosis Date  . Arthritis   . Enlarged prostate   . Essential hypertension   . GERD (gastroesophageal reflux disease)   . Gout    diet controlled no recent flare ups  . HIV infection [facility])      Past Surgical History:  Procedure Laterality Date  . APPENDECTOMY  2005   open  . Right elbow surgery  dec3 2020   in [place]  . TRANSURETHRAL RESECTION OF PROSTATE N/A 02/14/2020   Procedure: TRANSURETHRAL RESECTION OF THE PROSTATE (TURP), BIPOLAR;  Surgeon: Janith Lima, MD;  Location: [facility];  Service: Urology;  Laterality: N/A;       Review of Systems  Constitutional: Negative for appetite change, chills, fatigue, fever and unexpected weight change.  Eyes: Negative for visual disturbance.  Respiratory: Negative for cough, chest tightness, shortness of breath and wheezing.   Cardiovascular: Negative for chest pain and leg swelling.  Gastrointestinal: Negative for abdominal pain, constipation, diarrhea,  nausea and vomiting.  Genitourinary: Negative for dysuria, flank pain, frequency, genital sores, hematuria and urgency.  Skin: Negative for rash.  Allergic/Immunologic: Negative for immunocompromised state.  Neurological: Negative for dizziness and headaches.      Objective:    BP 132/73   Pulse 80   Temp 97.8 F (36.6 C) (Oral)   Ht 6\' 5"  (1.956 m)   Wt  258 lb (117 kg)   BMI 30.59 kg/m  Nursing note and vital signs reviewed.  Physical Exam Constitutional:      General: He is not in acute distress.    Appearance: He is well-developed.  HENT:     Mouth/Throat:     Mouth: Oropharynx is clear and moist.  Eyes:     Conjunctiva/sclera: Conjunctivae normal.  Cardiovascular:     Rate and Rhythm: Normal rate and regular rhythm.     Pulses: Intact distal pulses.     Heart sounds: Normal heart sounds. No murmur heard. No friction rub. No gallop.   Pulmonary:     Effort: Pulmonary effort is normal. No respiratory distress.     Breath sounds: Normal breath sounds. No wheezing or rales.  Chest:     Chest wall: No tenderness.  Abdominal:     General: Bowel sounds are normal.     Palpations: Abdomen is soft.     Tenderness: There is no abdominal tenderness.  Musculoskeletal:     Cervical back: Neck supple.  Lymphadenopathy:     Cervical: No cervical adenopathy.  Skin:    General: Skin is warm and dry.     Findings: No rash.  Neurological:     Mental Status: He is alert and oriented to person, place, and time.  Psychiatric:        Mood and Affect: Mood and affect normal.        Behavior: Behavior normal.        Thought Content: Thought content normal.        Judgment: Judgment normal.      Depression screen [facility] 2/9 03/27/2020 03/22/2020 12/21/2019 12/06/2019 11/18/2019  Decreased Interest 0 0 0 0 0  Down, Depressed, Hopeless 0 0 0 0 0  PHQ - 2 Score 0 0 0 0 0  Altered sleeping - 0 - 0 -  Tired, decreased energy - 0 - 0 -  Change in appetite - 0 - 0 -  Feeling bad or failure about yourself  - 0 - 0 -  Trouble concentrating - 0 - 0 -  Moving slowly or fidgety/restless - 0 - 0 -  Suicidal thoughts - 0 - 0 -  PHQ-9 Score - 0 - 0 -  Difficult doing work/chores - Not difficult at all Not difficult at all Not difficult at all -  Some recent data might be hidden       Assessment & Plan:    Patient Active Problem List    Diagnosis Date Noted  . S/P TURP 03/22/2020  . Healthcare maintenance 05/25/2019  . Prediabetes 04/14/2019  . Hypertension 10/31/2014  . HIV disease ([place]) 12/28/2007  . Osteoarthritis 12/28/2007     Problem List Items Addressed This Visit      Other   HIV disease [facility])    Mr. Winchell continues to have well-controlled HIV disease with good adherence and tolerance to his ART regimen of Tivicay and Descovy.  No signs/symptoms of opportunistic infection or progressive HIV disease.  We reviewed lab work and discussed plan of care.  Continue current  dose of Tivicay and Descovy.  Plan for follow-up in 4 months or sooner if needed with lab work 1 to 2 weeks prior to appointment.      Relevant Medications   dolutegravir (TIVICAY) 50 MG tablet   emtricitabine-tenofovir AF (DESCOVY) 200-25 MG tablet   Healthcare maintenance     Routine dental care is up-to-date per recommendations.  Discussed importance of safe sexual practice to reduce risk of STI.  Condoms declined.  Declines tetanus.  All other immunizations are up-to-date per recommendations including Covid.          I am having [name] maintain his atorvastatin, betamethasone valerate ointment, olmesartan-hydrochlorothiazide, omeprazole, fluticasone, Tivicay, and emtricitabine-tenofovir AF.   Meds ordered this encounter  Medications  . dolutegravir (TIVICAY) 50 MG tablet    Sig: Take 1 tablet (50 mg total) by mouth daily.    Dispense:  30 tablet    Refill:  5    Order Specific Question:   Supervising Provider    Answer:   Carlyle Basques [[id number]]  . emtricitabine-tenofovir AF (DESCOVY) 200-25 MG tablet    Sig: Take 1 tablet by mouth daily.    Dispense:  30 tablet    Refill:  5    Order Specific Question:   Supervising Provider    Answer:   Carlyle Basques [[id number]]     Follow-up: Return in about 4 months (around 07/25/2020), or if symptoms worsen or fail to improve.   Terri Piedra, MSN, FNP-C Nurse  Practitioner [facility] [place] number: [phone]

## 2020-03-27 NOTE — Telephone Encounter (Signed)
Patient notified of Hemoglobin A1c results. Continue to monitor.

## 2020-05-18 DIAGNOSIS — N401 Enlarged prostate with lower urinary tract symptoms: Secondary | ICD-10-CM | POA: Diagnosis not present

## 2020-05-18 DIAGNOSIS — R3914 Feeling of incomplete bladder emptying: Secondary | ICD-10-CM | POA: Diagnosis not present

## 2020-05-21 ENCOUNTER — Other Ambulatory Visit: Payer: Self-pay | Admitting: Internal Medicine

## 2020-06-13 ENCOUNTER — Other Ambulatory Visit: Payer: Self-pay | Admitting: Internal Medicine

## 2020-06-13 DIAGNOSIS — K219 Gastro-esophageal reflux disease without esophagitis: Secondary | ICD-10-CM

## 2020-06-13 NOTE — Assessment & Plan Note (Signed)
Patient requested refill on omeprazole. Not yet addressed by our clinic though has been addressed by infectious disease.  - Refill ordered - Address at next clinic visit

## 2020-06-14 ENCOUNTER — Telehealth: Payer: Self-pay

## 2020-06-14 NOTE — Telephone Encounter (Signed)
Need refill on omeprazole (PRILOSEC) 20 MG capsule ;pt contact [phone]   [id number] - [place], [place] - [address] 

## 2020-06-14 NOTE — Telephone Encounter (Signed)
#  90 with 1 refill sent yesterday to requested Pharmacy.

## 2020-06-15 ENCOUNTER — Other Ambulatory Visit: Payer: Self-pay

## 2020-06-15 DIAGNOSIS — I10 Essential (primary) hypertension: Secondary | ICD-10-CM

## 2020-06-15 MED ORDER — OLMESARTAN MEDOXOMIL-HCTZ 40-25 MG PO TABS: 1.0000 | ORAL_TABLET | Freq: Every day | ORAL | 3 refills | Status: DC

## 2020-06-15 NOTE — Telephone Encounter (Signed)
olmesartan-hydrochlorothiazide (BENICAR HCT) 40-25 MG tablet, refill request @  [facility] [place], [place] Phone:  [phone]  Fax:  [phone]

## 2020-07-05 ENCOUNTER — Other Ambulatory Visit: Payer: Self-pay

## 2020-07-05 DIAGNOSIS — Z113 Encounter for screening for infections with a predominantly sexual mode of transmission: Secondary | ICD-10-CM

## 2020-07-05 DIAGNOSIS — Z79899 Other long term (current) drug therapy: Secondary | ICD-10-CM

## 2020-07-05 DIAGNOSIS — B20 Human immunodeficiency virus [HIV] disease: Secondary | ICD-10-CM

## 2020-07-06 ENCOUNTER — Other Ambulatory Visit: Payer: Self-pay

## 2020-07-06 ENCOUNTER — Ambulatory Visit: Payer: Medicaid Other | Admitting: Internal Medicine

## 2020-07-06 ENCOUNTER — Encounter: Payer: Self-pay | Admitting: Internal Medicine

## 2020-07-06 DIAGNOSIS — J302 Other seasonal allergic rhinitis: Secondary | ICD-10-CM | POA: Diagnosis not present

## 2020-07-06 DIAGNOSIS — K219 Gastro-esophageal reflux disease without esophagitis: Secondary | ICD-10-CM

## 2020-07-06 DIAGNOSIS — Z0000 Encounter for general adult medical examination without abnormal findings: Secondary | ICD-10-CM

## 2020-07-06 MED ORDER — ZOSTER VAC RECOMB ADJUVANTED 50 MCG/0.5ML IM SUSR: 0.5000 mL | Freq: Once | INTRAMUSCULAR | 0 refills | Status: AC

## 2020-07-06 MED ORDER — FLUTICASONE PROPIONATE 50 MCG/ACT NA SUSP: 1.0000 | Freq: Every day | NASAL | 1 refills | Status: DC | PRN

## 2020-07-06 MED ORDER — OMEPRAZOLE 20 MG PO CPDR: 20.0000 mg | DELAYED_RELEASE_CAPSULE | Freq: Two times a day (BID) | ORAL | 1 refills | Status: DC

## 2020-07-06 MED ORDER — OMEPRAZOLE 20 MG PO CPDR: 40.0000 mg | DELAYED_RELEASE_CAPSULE | Freq: Two times a day (BID) | ORAL | 1 refills | Status: DC

## 2020-07-06 NOTE — Assessment & Plan Note (Signed)
Tetanus vaccine administered in the office today. Shingles vaccine sent to pharmacy I reviewed his colonoscopy records at today's visit. It appears that his last colonoscopy was in 2018 which showed a tubular adenoma. He reports that he was told to undergo a repeat colonoscopy in 5 years.  -will plan to have him undergo his next colonoscopy in 2023

## 2020-07-06 NOTE — Assessment & Plan Note (Signed)
Pt notes that he has been experiencing worsening allergy symptoms over the past 2 weeks including rhinorhea, sinus congestion, itchy eyes, headaches.  Currently on allegra. He notes that flonase has worked well for him in the past so I encouraged him to resume this. Provided education on administration techniques. He will follow up with [place] if symptoms do not improve.

## 2020-07-06 NOTE — Assessment & Plan Note (Signed)
This OV was for discussion of worsening GERD symptoms over the past couple of weeks. Pt notes that he has been on omeprazole for around 3 years and that it had been managing his symptoms well.  Medications: Prilosec 20mg  daily Denies dysphagia, voice change, weight loss, hematemesis, melena or other alarming features No previous endoscopy studies.  Plan:  -increase prilosec to 40mg  twice daily for 4 weeks. If no improvement after that time, will place referral to GI for further evaluation.

## 2020-07-06 NOTE — Patient Instructions (Addendum)
I would like you to try increasing the Prilosec to 40mg  two times daily for 1 month. If your symptoms do not improve, then please let us know so we can place a referral to the gastroenterologist (stomach doctor).  In regards to your allergies, please start using flonase, in addition to the [place]. If you continue to struggle with symptoms, please let us know.  I will send the shingles vaccine to your pharmacy where they will administer it to you.

## 2020-07-06 NOTE — Progress Notes (Signed)
Office Visit   Patient ID: Chad Little, male    DOB: 01/23/1961, 60 y.o.   MRN: [id number]   PCP: Madalyn Rob, MD   Subjective:  CC: GERD, allergies  Chad Little is a 60 y.o. year old male who presents for evaluation of the above. Please refer to problem based charting for assessment and plan.      ACTIVE MEDICATIONS   Outpatient Medications Prior to Visit  Medication Sig Dispense Refill  . atorvastatin (LIPITOR) 10 MG tablet Take 1 tablet (10 mg total) by mouth daily. 30 tablet 11  . betamethasone valerate ointment (VALISONE) 0.1 % Apply 1 application topically 2 (two) times daily. (Patient taking differently: Apply 1 application topically 2 (two) times daily as needed.) 30 g 0  . dolutegravir (TIVICAY) 50 MG tablet Take 1 tablet (50 mg total) by mouth daily. 30 tablet 5  . emtricitabine-tenofovir AF (DESCOVY) 200-25 MG tablet Take 1 tablet by mouth daily. 30 tablet 5  . olmesartan-hydrochlorothiazide (BENICAR HCT) 40-25 MG tablet Take 1 tablet by mouth daily. 90 tablet 3  . fluticasone (FLONASE) 50 MCG/ACT nasal spray Place 1 spray into both nostrils daily as needed for allergies or rhinitis. 15.8 mL 1  . omeprazole (PRILOSEC) 20 MG capsule TAKE 1 CAPSULE(20 MG) BY MOUTH DAILY 90 capsule 1   No facility-administered medications prior to visit.     Objective:   BP 127/88 (BP Location: Right Arm, Patient Position: Sitting, Cuff Size: Normal)   Pulse 88   Temp 98.1 F (36.7 C) (Oral)   Wt 256 lb 9.6 oz (116.4 kg)   SpO2 97%   BMI 30.43 kg/m  Wt Readings from Last 3 Encounters:  07/06/20 256 lb 9.6 oz (116.4 kg)  03/27/20 258 lb (117 kg)  03/22/20 260 lb 4.8 oz (118.1 kg)   BP Readings from Last 3 Encounters:  07/06/20 127/88  03/27/20 132/73  03/22/20 (!) 126/97   Physical Exam General: well appearing, in no acute distress HENT: no temporal wasting Eyes: no conjunctival injection Abdomen: non-distended. No pain on palpation  Health Maintenance:    Health Maintenance  Topic Date Due  . COLONOSCOPY (Pts 45-2yrs Insurance coverage will need to be confirmed)  Never done  . Zoster Vaccines- Shingrix (1 of 2) Never done  . COVID-19 Vaccine (4 - Booster for Chad Little series) 02/09/2020  . INFLUENZA VACCINE  09/04/2020  . TETANUS/TDAP  07/07/2030  . Hepatitis C Screening  Completed  . HIV Screening  Completed  . HPV VACCINES  Aged Out     Assessment & Plan:   Problem List Items Addressed This Visit      Digestive   GERD (gastroesophageal reflux disease)    This OV was for discussion of worsening GERD symptoms over the past couple of weeks. Pt notes that he has been on omeprazole for around 3 years and that it had been managing his symptoms well.  Medications: Prilosec 20mg  daily Denies dysphagia, voice change, weight loss, hematemesis, melena or other alarming features No previous endoscopy studies.  Plan:  -increase prilosec to 40mg  twice daily for 4 weeks. If no improvement after that time, will place referral to GI for further evaluation.       Relevant Medications   omeprazole (PRILOSEC) 20 MG capsule     Other   Healthcare maintenance    Tetanus vaccine administered in the office today. Shingles vaccine sent to pharmacy I reviewed his colonoscopy records at today's visit. It appears that his last  colonoscopy was in 2018 which showed a tubular adenoma. He reports that he was told to undergo a repeat colonoscopy in 5 years.  -will plan to have him undergo his next colonoscopy in 2023      Seasonal allergies    Pt notes that he has been experiencing worsening allergy symptoms over the past 2 weeks including rhinorhea, sinus congestion, itchy eyes, headaches.  Currently on allegra. He notes that flonase has worked well for him in the past so I encouraged him to resume this. Provided education on administration techniques. He will follow up with [place] if symptoms do not improve.      Relevant Medications   fluticasone  (FLONASE) 50 MCG/ACT nasal spray      Follow up:  -if GERD symptoms are not improved in 4 weeks, place referral to GI for further evaluation.  -follow up with PCP in 3 months  Pt discussed with Dr. Elwanda Brooklyn, MD Internal Medicine Resident PGY-2 [facility] Pager: [phone] 07/06/2020 10:52 AM

## 2020-07-07 NOTE — Progress Notes (Signed)
Internal Medicine Clinic Attending  Case discussed with Dr. Christian  At the time of the visit.  We reviewed the resident's history and exam and pertinent patient test results.  I agree with the assessment, diagnosis, and plan of care documented in the resident's note.  

## 2020-07-10 ENCOUNTER — Other Ambulatory Visit: Payer: Self-pay

## 2020-07-10 ENCOUNTER — Other Ambulatory Visit: Payer: Medicaid Other

## 2020-07-10 ENCOUNTER — Other Ambulatory Visit (HOSPITAL_COMMUNITY)
Admission: RE | Admit: 2020-07-10 | Discharge: 2020-07-10 | Disposition: A | Discharge status: Home / Self Care | Payer: Medicaid Other | Source: Ambulatory Visit | Attending: Family | Admitting: Family

## 2020-07-10 DIAGNOSIS — Z113 Encounter for screening for infections with a predominantly sexual mode of transmission: Secondary | ICD-10-CM | POA: Insufficient documentation

## 2020-07-10 DIAGNOSIS — B20 Human immunodeficiency virus [HIV] disease: Secondary | ICD-10-CM | POA: Insufficient documentation

## 2020-07-10 DIAGNOSIS — Z79899 Other long term (current) drug therapy: Secondary | ICD-10-CM | POA: Diagnosis not present

## 2020-07-11 LAB — URINE CYTOLOGY ANCILLARY ONLY
Chlamydia: NEGATIVE
Comment: NEGATIVE
Comment: NORMAL
Neisseria Gonorrhea: NEGATIVE

## 2020-07-11 LAB — T-HELPER CELL (CD4) - (RCID CLINIC ONLY)
CD4 % Helper T Cell: 35 % (ref 33–65)
CD4 T Cell Abs: 701 /uL (ref 400–1790)

## 2020-07-12 LAB — CBC WITH DIFFERENTIAL/PLATELET
Absolute Monocytes: 767 cells/uL (ref 200–950)
Basophils Absolute: 59 cells/uL (ref 0–200)
Basophils Relative: 0.9 %
Eosinophils Absolute: 267 cells/uL (ref 15–500)
Eosinophils Relative: 4.1 %
HCT: 48.8 % (ref 38.5–50.0)
Hemoglobin: 16.9 g/dL (ref 13.2–17.1)
Lymphs Abs: 2093 cells/uL (ref 850–3900)
MCH: 32.4 pg (ref 27.0–33.0)
MCHC: 34.6 g/dL (ref 32.0–36.0)
MCV: 93.5 fL (ref 80.0–100.0)
MPV: 10 fL (ref 7.5–12.5)
Monocytes Relative: 11.8 %
Neutro Abs: 3315 cells/uL (ref 1500–7800)
Neutrophils Relative %: 51 %
Platelets: 264 10*3/uL (ref 140–400)
RBC: 5.22 10*6/uL (ref 4.20–5.80)
RDW: 12.9 % (ref 11.0–15.0)
Total Lymphocyte: 32.2 %
WBC: 6.5 10*3/uL (ref 3.8–10.8)

## 2020-07-12 LAB — COMPLETE METABOLIC PANEL WITH GFR
AG Ratio: 1.4 (calc) (ref 1.0–2.5)
ALT: 25 U/L (ref 9–46)
AST: 29 U/L (ref 10–35)
Albumin: 4.5 g/dL (ref 3.6–5.1)
Alkaline phosphatase (APISO): 107 U/L (ref 35–144)
BUN: 16 mg/dL (ref 7–25)
CO2: 25 mmol/L (ref 20–32)
Calcium: 9.7 mg/dL (ref 8.6–10.3)
Chloride: 103 mmol/L (ref 98–110)
Creat: 1.24 mg/dL (ref 0.70–1.25)
GFR, Est African American: 72 mL/min/{1.73_m2} (ref >=60)
GFR, Est Non African American: 62 mL/min/{1.73_m2} (ref >=60)
Globulin: 3.3 g/dL (calc) (ref 1.9–3.7)
Glucose, Bld: 109 mg/dL — ABNORMAL HIGH (ref 65–99)
Potassium: 4.1 mmol/L (ref 3.5–5.3)
Sodium: 140 mmol/L (ref 135–146)
Total Bilirubin: 0.5 mg/dL (ref 0.2–1.2)
Total Protein: 7.8 g/dL (ref 6.1–8.1)

## 2020-07-12 LAB — LIPID PANEL
Cholesterol: 109 mg/dL (ref <200)
HDL: 53 mg/dL (ref >=40)
LDL Cholesterol (Calc): 42 mg/dL (calc)
Non-HDL Cholesterol (Calc): 56 mg/dL (calc) (ref <130)
Total CHOL/HDL Ratio: 2.1 (calc) (ref <5.0)
Triglycerides: 48 mg/dL (ref <150)

## 2020-07-12 LAB — HIV-1 RNA QUANT-NO REFLEX-BLD
HIV 1 RNA Quant: NOT DETECTED Copies/mL
HIV-1 RNA Quant, Log: NOT DETECTED Log cps/mL

## 2020-07-24 ENCOUNTER — Other Ambulatory Visit: Payer: Self-pay

## 2020-07-24 ENCOUNTER — Ambulatory Visit (INDEPENDENT_AMBULATORY_CARE_PROVIDER_SITE_OTHER): Payer: Medicaid Other

## 2020-07-24 ENCOUNTER — Encounter: Payer: Self-pay | Admitting: Family

## 2020-07-24 ENCOUNTER — Ambulatory Visit (INDEPENDENT_AMBULATORY_CARE_PROVIDER_SITE_OTHER): Payer: Medicaid Other | Admitting: Family

## 2020-07-24 VITALS — BP 127/85 | HR 76 | Temp 98.0°F | Wt 254.0 lb

## 2020-07-24 DIAGNOSIS — B20 Human immunodeficiency virus [HIV] disease: Secondary | ICD-10-CM | POA: Diagnosis not present

## 2020-07-24 DIAGNOSIS — Z23 Encounter for immunization: Secondary | ICD-10-CM | POA: Diagnosis not present

## 2020-07-24 DIAGNOSIS — Z0000 Encounter for general adult medical examination without abnormal findings: Secondary | ICD-10-CM | POA: Diagnosis not present

## 2020-07-24 MED ORDER — TIVICAY 50 MG PO TABS: 50.0000 mg | ORAL_TABLET | Freq: Every day | ORAL | 5 refills | Status: DC

## 2020-07-24 MED ORDER — EMTRICITABINE-TENOFOVIR AF 200-25 MG PO TABS: 1.0000 | ORAL_TABLET | Freq: Every day | ORAL | 5 refills | Status: DC

## 2020-07-24 NOTE — Patient Instructions (Signed)
Nice to see you.  Continue to take your medications daily.  Refills have been sent to the pharmacy.  Plan for follow up in 4 months or sooner if needed with lab work 1-2 weeks prior to appointment.  Have a great day and stay safe!

## 2020-07-24 NOTE — Progress Notes (Signed)
   Covid-19 Vaccination Clinic  Name:  Chad Little    MRN: [id number] DOB: 05-27-1960  07/24/2020  Chad Little was observed post Covid-19 immunization for 15 minutes without incident. He was provided with Vaccine Information Sheet and instruction to access the V-Safe system.   Chad Little was instructed to call 911 with any severe reactions post vaccine: Difficulty breathing  Swelling of face and throat  A fast heartbeat  A bad rash all over body  Dizziness and weakness   Immunizations Administered     Name Date Dose VIS Date Little   PFIZER Comrnaty(Gray TOP) Covid-19 Vaccine 07/24/2020 11:15 AM 0.3 mL 01/13/2020 Intramuscular   Manufacturer: [place]   Lot: [id number]   [place]: [phone]

## 2020-07-24 NOTE — Assessment & Plan Note (Signed)
Chad Little continues to have well-controlled HIV disease with good adherence and tolerance to his ART regimen of Tivicay and Descovy.  No signs/symptoms of opportunistic infection or progressive HIV.  We reviewed lab work and plan of care.  Continue current dose of Tivicay and Descovy.  Plan for follow-up in 4 months or sooner if needed with lab work 1 to 2 weeks prior to appointment.

## 2020-07-24 NOTE — Assessment & Plan Note (Signed)
   Discussed importance of safe sexual practice to reduce risk of STI.  Condoms declined.  COVID booster updated today.  Has dentures with no indications for routine dental care.

## 2020-07-24 NOTE — Progress Notes (Signed)
Brief Narrative   Patient ID: Chad Little, male    DOB: 1960/11/19, 60 y.o.   MRN: [id number]  Chad Little is a 60 year old African-American male diagnosed with HIV-1 in 1998 with risk factor heterosexual contact.  Initial viral load and CD4 count unavailable.  Genotype from 2009 with no significant resistance patterns.  No history of opportunistic infection. [id number] negative. ART regimen of Tivicay and Descovy.   Subjective:    Chief Complaint  Patient presents with   HIV Positive/AIDS     HPI:  Chad Little is a 60 y.o. male with HIV disease last seen on 03/27/2020 with well-controlled virus and good adherence and tolerance to his ART regimen of Tivicay and Descovy.  Viral load at the time was undetectable with CD4 count 613.  Most recent blood work completed on 07/11/2018 with viral load that remains undetectable and CD4 count of 701.  Kidney function, liver function, electrolytes within normal ranges.  Urine was negative for gonorrhea and chlamydia.  Here today for routine follow-up.  Mr. Bozzi continues to take his Tivicay and Descovy daily as prescribed with no adverse side effects or missed doses since his last office visit.  Overall feeling well today with no new concerns/complaints. Denies fevers, chills, night sweats, headaches, changes in vision, neck pain/stiffness, nausea, diarrhea, vomiting, lesions or rashes.  Mr. Weckwerth has no problems obtaining medication from the pharmacy and remains covered through [facility].  Denies feelings of being down, depressed, or hopeless recently.  No recreational illicit drug use, tobacco use, or alcohol consumption.  Condoms offered and declined.  Has dentures with no indications for routine dental care.  Requesting COVID booster.  No Known Allergies    Outpatient Medications Prior to Visit  Medication Sig Dispense Refill   atorvastatin (LIPITOR) 10 MG tablet Take 1 tablet (10 mg total) by mouth daily. 30 tablet 11    betamethasone valerate ointment (VALISONE) 0.1 % Apply 1 application topically 2 (two) times daily. (Patient taking differently: Apply 1 application topically 2 (two) times daily as needed.) 30 g 0   fluticasone (FLONASE) 50 MCG/ACT nasal spray Place 1 spray into both nostrils daily as needed for allergies or rhinitis. 15.8 mL 1   olmesartan-hydrochlorothiazide (BENICAR HCT) 40-25 MG tablet Take 1 tablet by mouth daily. 90 tablet 3   omeprazole (PRILOSEC) 20 MG capsule Take 2 capsules (40 mg total) by mouth 2 (two) times daily before a meal. 60 capsule 1   dolutegravir (TIVICAY) 50 MG tablet Take 1 tablet (50 mg total) by mouth daily. 30 tablet 5   emtricitabine-tenofovir AF (DESCOVY) 200-25 MG tablet Take 1 tablet by mouth daily. 30 tablet 5   No facility-administered medications prior to visit.     Past Medical History:  Diagnosis Date   Arthritis    Enlarged prostate    Essential hypertension    GERD (gastroesophageal reflux disease)    Gout    diet controlled no recent flare ups   HIV infection [facility])      Past Surgical History:  Procedure Laterality Date   APPENDECTOMY  2005   open   Right elbow surgery  dec3 2020   in [place]   TRANSURETHRAL RESECTION OF PROSTATE N/A 02/14/2020   Procedure: TRANSURETHRAL RESECTION OF THE PROSTATE (TURP), BIPOLAR;  Surgeon: Janith Lima, MD;  Location: [facility];  Service: Urology;  Laterality: N/A;       Review of Systems  Constitutional:  Negative for appetite change, chills, fatigue,  fever and unexpected weight change.  Eyes:  Negative for visual disturbance.  Respiratory:  Negative for cough, chest tightness, shortness of breath and wheezing.   Cardiovascular:  Negative for chest pain and leg swelling.  Gastrointestinal:  Negative for abdominal pain, constipation, diarrhea, nausea and vomiting.  Genitourinary:  Negative for dysuria, flank pain, frequency, genital sores, hematuria and urgency.  Skin:  Negative  for rash.  Allergic/Immunologic: Negative for immunocompromised state.  Neurological:  Negative for dizziness and headaches.     Objective:    BP 127/85   Pulse 76   Temp 98 F (36.7 C)   Wt 254 lb (115.2 kg)   SpO2 98%   BMI 30.12 kg/m  Nursing note and vital signs reviewed.  Physical Exam Constitutional:      General: He is not in acute distress.    Appearance: He is well-developed.  Eyes:     Conjunctiva/sclera: Conjunctivae normal.  Cardiovascular:     Rate and Rhythm: Normal rate and regular rhythm.     Heart sounds: Normal heart sounds. No murmur heard.   No friction rub. No gallop.  Pulmonary:     Effort: Pulmonary effort is normal. No respiratory distress.     Breath sounds: Normal breath sounds. No wheezing or rales.  Chest:     Chest wall: No tenderness.  Abdominal:     General: Bowel sounds are normal.     Palpations: Abdomen is soft.     Tenderness: There is no abdominal tenderness.  Musculoskeletal:     Cervical back: Neck supple.  Lymphadenopathy:     Cervical: No cervical adenopathy.  Skin:    General: Skin is warm and dry.     Findings: No rash.  Neurological:     Mental Status: He is alert and oriented to person, place, and time.  Psychiatric:        Behavior: Behavior normal.        Thought Content: Thought content normal.        Judgment: Judgment normal.     Depression screen [facility] 2/9 07/24/2020 07/06/2020 03/27/2020 03/22/2020 12/21/2019  Decreased Interest 0 0 0 0 0  Down, Depressed, Hopeless 0 0 0 0 0  PHQ - 2 Score 0 0 0 0 0  Altered sleeping - - - 0 -  Tired, decreased energy - - - 0 -  Change in appetite - - - 0 -  Feeling bad or failure about yourself  - - - 0 -  Trouble concentrating - - - 0 -  Moving slowly or fidgety/restless - - - 0 -  Suicidal thoughts - - - 0 -  PHQ-9 Score - - - 0 -  Difficult doing work/chores - - - Not difficult at all Not difficult at all  Some recent data might be hidden       Assessment & Plan:     Patient Active Problem List   Diagnosis Date Noted   Seasonal allergies 07/06/2020   GERD (gastroesophageal reflux disease) 06/13/2020   S/P TURP 03/22/2020   Healthcare maintenance 05/25/2019   Prediabetes 04/14/2019   Hypertension 10/31/2014   HIV disease ([place]) 12/28/2007   Osteoarthritis 12/28/2007     Problem List Items Addressed This Visit       Other   HIV disease [facility])    Mr. Limehouse continues to have well-controlled HIV disease with good adherence and tolerance to his ART regimen of Tivicay and Descovy.  No signs/symptoms of opportunistic infection or progressive HIV.  We reviewed lab work and plan of care.  Continue current dose of Tivicay and Descovy.  Plan for follow-up in 4 months or sooner if needed with lab work 1 to 2 weeks prior to appointment.       Relevant Medications   dolutegravir (TIVICAY) 50 MG tablet   emtricitabine-tenofovir AF (DESCOVY) 200-25 MG tablet   Healthcare maintenance    Discussed importance of safe sexual practice to reduce risk of STI.  Condoms declined. COVID booster updated today. Has dentures with no indications for routine dental care.         I am having [name] maintain his atorvastatin, betamethasone valerate ointment, olmesartan-hydrochlorothiazide, fluticasone, omeprazole, Tivicay, and emtricitabine-tenofovir AF.   Meds ordered this encounter  Medications   dolutegravir (TIVICAY) 50 MG tablet    Sig: Take 1 tablet (50 mg total) by mouth daily.    Dispense:  30 tablet    Refill:  5    Order Specific Question:   Supervising Provider    Answer:   Baxter Flattery, CYNTHIA [[id number]]   emtricitabine-tenofovir AF (DESCOVY) 200-25 MG tablet    Sig: Take 1 tablet by mouth daily.    Dispense:  30 tablet    Refill:  5    Order Specific Question:   Supervising Provider    Answer:   Carlyle Basques [[id number]]      Follow-up: Return in about 4 months (around 11/23/2020), or if symptoms worsen or fail to improve.   Terri Piedra, MSN, FNP-C Nurse Practitioner [facility] [place] number: [phone]

## 2020-08-08 ENCOUNTER — Ambulatory Visit: Payer: Medicaid Other | Admitting: Student

## 2020-08-08 ENCOUNTER — Encounter: Payer: Self-pay | Admitting: Student

## 2020-08-08 ENCOUNTER — Other Ambulatory Visit: Payer: Self-pay

## 2020-08-08 ENCOUNTER — Telehealth: Payer: Self-pay | Admitting: *Deleted

## 2020-08-08 VITALS — BP 127/86 | HR 86 | Temp 98.1°F | Ht 77.0 in | Wt 256.4 lb

## 2020-08-08 DIAGNOSIS — Z23 Encounter for immunization: Secondary | ICD-10-CM | POA: Diagnosis not present

## 2020-08-08 DIAGNOSIS — K219 Gastro-esophageal reflux disease without esophagitis: Secondary | ICD-10-CM

## 2020-08-08 DIAGNOSIS — I82890 Acute embolism and thrombosis of other specified veins: Secondary | ICD-10-CM | POA: Diagnosis not present

## 2020-08-08 DIAGNOSIS — Z0000 Encounter for general adult medical examination without abnormal findings: Secondary | ICD-10-CM | POA: Diagnosis not present

## 2020-08-08 HISTORY — DX: Acute embolism and thrombosis of other specified veins: I82.890

## 2020-08-08 MED ORDER — SHINGRIX 50 MCG/0.5ML IM SUSR: 0.5000 mL | Freq: Once | INTRAMUSCULAR | 0 refills | Status: AC

## 2020-08-08 NOTE — Progress Notes (Signed)
CC: Left leg pain, acid reflux  HPI:  Chad Little is a 60 y.o. male with a past medical history stated below and presents today for left leg pain and follow-up for chronic acid reflux. Please see problem based assessment and plan for additional details.  Past Medical History:  Diagnosis Date   Arthritis    Enlarged prostate    Essential hypertension    GERD (gastroesophageal reflux disease)    Gout    diet controlled no recent flare ups   HIV infection ([place])     Current Outpatient Medications on File Prior to Visit  Medication Sig Dispense Refill   atorvastatin (LIPITOR) 10 MG tablet Take 1 tablet (10 mg total) by mouth daily. 30 tablet 11   betamethasone valerate ointment (VALISONE) 0.1 % Apply 1 application topically 2 (two) times daily. (Patient taking differently: Apply 1 application topically 2 (two) times daily as needed.) 30 g 0   dolutegravir (TIVICAY) 50 MG tablet Take 1 tablet (50 mg total) by mouth daily. 30 tablet 5   emtricitabine-tenofovir AF (DESCOVY) 200-25 MG tablet Take 1 tablet by mouth daily. 30 tablet 5   fluticasone (FLONASE) 50 MCG/ACT nasal spray Place 1 spray into both nostrils daily as needed for allergies or rhinitis. 15.8 mL 1   olmesartan-hydrochlorothiazide (BENICAR HCT) 40-25 MG tablet Take 1 tablet by mouth daily. 90 tablet 3   omeprazole (PRILOSEC) 20 MG capsule Take 2 capsules (40 mg total) by mouth 2 (two) times daily before a meal. 60 capsule 1   No current facility-administered medications on file prior to visit.    Family History  Problem Relation Age of Onset   Alcohol abuse Mother    Hypertension Father    Stroke Father     Social History   Socioeconomic History   Marital status: Single    Spouse name: Not on file   Number of children: Not on file   Years of education: Not on file   Highest education level: Not on file  Occupational History   Not on file  Tobacco Use   Smoking status: Never   Smokeless tobacco:  Never  Vaping Use   Vaping Use: Never used  Substance and Sexual Activity   Alcohol use: Not Currently    Alcohol/week: 0.0 standard drinks    Comment: none since 2017   Drug use: Yes    Types: Marijuana, "Crack" cocaine    Comment:  cocaine and marijuana none since 2017   Sexual activity: Not Currently    Comment: refused  condoms 11/09/2019  Other Topics Concern   Not on file  Social History Narrative   Not on file   Social Determinants of Health   Financial Resource Strain: Not on file  Food Insecurity: Not on file  Transportation Needs: Not on file  Physical Activity: Not on file  Stress: Not on file  Social Connections: Not on file  Intimate Partner Violence: Not on file    Review of Systems: ROS negative except for what is noted on the assessment and plan.  Vitals:   08/08/20 1439  BP: 127/86  Pulse: 86  Temp: 98.1 F (36.7 C)  TempSrc: Oral  SpO2: 96%  Weight: 256 lb 6.4 oz (116.3 kg)  Height: 6\' 5"  (1.956 m)   Physical Exam: Constitutional: Well-appearing, no acute distress HENT: normocephalic atraumatic Eyes: conjunctiva non-erythematous Neck: supple Cardiovascular: regular rate and rhythm, no m/r/g Pulmonary/Chest: normal work of breathing on room air MSK: normal bulk and  tone, tender to palpate medial aspect of left calf about halfway up.  No erythema or warmth appreciated. Neurological: alert & oriented x 3 Skin: warm and dry Psych: Normal mood and thought process  Bedside ultrasound imaging       Assessment & Plan:   See Encounters Tab for problem based charting.  Patient seen with Dr. Delano Metz, D.O. [facility], PGY-1 Pager: [phone], Phone: [phone] Date 08/08/2020 Time 5:47 PM

## 2020-08-08 NOTE — Assessment & Plan Note (Addendum)
Assessment: Patient notes that he has had persistent acid reflux symptoms since increasing his Prilosec to 40 mg twice daily.  This was increased on 07/06/2020.  Discussed with patient that I would continue this dose and if no improvement with 6 weeks of increased dose, can consider changing PPIs.  Patient also denies having work-up for H. pylori in the past.  We will order stool antigen.   Plan: -Follow-up in 1 month to assess GERD symptoms -Continue omeprazole 40 mg twice daily, if no improvement in symptoms can consider changing to a different PPI -Stool antigen H. pylori ordered  Addendum: H pylori stool antigen negative. F/U one month and assess symptoms

## 2020-08-08 NOTE — Assessment & Plan Note (Signed)
Shingrix vaccine ordered

## 2020-08-08 NOTE — Patient Instructions (Addendum)
Thank you, Mr.[name] for allowing Chad Little to provide your care today. Today we discussed   Leg Pain I am sorry that you are having this recurrent leg pain.  I believe that you are having a superficial blood clot in the veins of your legs.  Recommendation is warm compresses as well as compression stockings.  You can purchase compression stockings at your local pharmacy store or [place].  Please take ibuprofen for any continued pain.  Acid Reflux Please continue to take your Prilosec for your acid reflux.  We will also be ordering for a stool test, to test for H. pylori.  This is a bacteria that can cause resistant acid reflux.  Shingles Shot I have ordered a shingles shot for you.  Please go to your [facility] and request this to be done.  I have ordered the following labs for you:  Lab Orders  H. pylori antigen, stool    Referrals ordered today:   Referral Orders  No referral(s) requested today     I have ordered the following medication/changed the following medications:   Stop the following medications: There are no discontinued medications.   Start the following medications: Meds ordered this encounter  Medications   Zoster Vaccine Adjuvanted [facility]) injection    Sig: Inject 0.5 mLs into the muscle once for 1 dose.    Dispense:  0.5 mL    Refill:  0     Follow up:  Acid reflux follow-up in 1 month   Should you have any questions or concerns please call the internal medicine clinic at [phone].     [name], D.O. [place]

## 2020-08-08 NOTE — Telephone Encounter (Signed)
Patient called in c/o left calf tender to touch x 1 week. No hx of blood clots. States he had this once before and all tests came back normal. 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 No recent long distance travel. Appt given today with [facility] Team as Yellow Team is full.

## 2020-08-08 NOTE — Assessment & Plan Note (Signed)
Assessment: Mr. Novinger presented to the clinic today with left calf pain for the past 1.5 weeks.  He states that the pain started when he was lying in bed he rolled over and his right leg touched his left knee noticed pain on the left calf and the medial aspect.  States that the pain is sharp in nature and when palpated is a 8 out of 10.  He denies radiation of the pain.  Pain improves when the area is not palpated.  He notes similar pain in the past and had a work-up for DVT that was negative.  Patient states that in the past he has had lower extremity swelling that improved when he switched from amlodipine to HCTZ.  He also states that during his prior DVT work-up, he did not have the leg pain when the ultrasound was done, the pain had resolved at this time.  Due to high suspicion for superficial vein thrombosis, bedside ultrasound was performed by myself and Dr. Heber [place].  Please see the imaging in the patient's note.  It does appear as though one of his veins is not compressible consistent with a superficial vein thrombosis.  I believe that the patient is having recurrence of the secondary to varicose veins.  Discussed with the patient that he should use warm compresses, NSAIDs, compression stockings.  Do not believe patient needs a venous duplex at this time.  Discussed that if he notices persistent or recurrent pain to call our clinic.  Patient agrees with the plan.  Plan: -Compression stockings, NSAIDs, warm compresses -Follow-up as needed or for persistent pain -Do not believe patient needs to be anticoagulated at this time

## 2020-08-10 ENCOUNTER — Other Ambulatory Visit: Payer: Medicaid Other

## 2020-08-10 DIAGNOSIS — K219 Gastro-esophageal reflux disease without esophagitis: Secondary | ICD-10-CM | POA: Diagnosis not present

## 2020-08-11 NOTE — Progress Notes (Signed)
Internal Medicine Clinic Attending  I saw and evaluated the patient.  I personally confirmed the key portions of the history and exam documented by Dr. Johnney Ou   and I reviewed pertinent patient test results.  The assessment, diagnosis, and plan were formulated together and I agree with the documentation in the resident's note. As noted on ultrasound there is an area of non compressible superficial vein in the area of his pain indicating superficial vein thrombosis.

## 2020-08-13 LAB — H. PYLORI ANTIGEN, STOOL: H pylori Ag, Stl: NEGATIVE

## 2020-08-21 ENCOUNTER — Telehealth: Payer: Self-pay

## 2020-08-21 MED ORDER — PANTOPRAZOLE SODIUM 40 MG PO TBEC: 40.0000 mg | DELAYED_RELEASE_TABLET | Freq: Every day | ORAL | 1 refills | Status: DC

## 2020-08-21 NOTE — Telephone Encounter (Signed)
Returned call to patient. States he has continued taking omeprazole 20 mg 2 tabs BID and is having no relief from GERD symptoms. States he has to take Tums in the middle of the night. States he doesn't lie down after eating, doesn't eat spicy foods. He is hoping Provider will change to a different medicine.

## 2020-08-21 NOTE — Telephone Encounter (Signed)
Requesting to speak with a nurse about acid reflux, please call pt back.

## 2020-08-23 ENCOUNTER — Telehealth: Payer: Self-pay

## 2020-08-23 NOTE — Telephone Encounter (Signed)
RTC, VM obtained and message left that triage nurse was returning his call. SChaplin, RN,BSN

## 2020-08-23 NOTE — Telephone Encounter (Signed)
Question about taking  olmesartan-hydrochlorothiazide (BENICAR HCT) 40-25 MG tablet. Please call pt back.

## 2020-08-23 NOTE — Telephone Encounter (Signed)
Returned call to patient. States when he p/u new GERD medicine yesterday, the Pharmacy also gave him HCTZ 25 mg. States he already takes Olmesartan-HCTZ 40-25 mg daily. Explained he does not need to take HCTZ separately as it is in his combo med. He is advised to call pharmacy to see why they gave him this separately. He was very [employer] attorney.

## 2020-10-09 DIAGNOSIS — Z23 Encounter for immunization: Secondary | ICD-10-CM | POA: Diagnosis not present

## 2020-10-18 ENCOUNTER — Other Ambulatory Visit: Payer: Self-pay | Admitting: Student

## 2020-10-18 NOTE — Telephone Encounter (Signed)
Pt is also requesting hisatorvastatin (LIPITOR) 10 MG tablet  sent to the same pharmacy

## 2020-10-19 MED ORDER — ATORVASTATIN CALCIUM 10 MG PO TABS: 10.0000 mg | ORAL_TABLET | Freq: Every day | ORAL | 11 refills | Status: DC

## 2020-10-23 ENCOUNTER — Other Ambulatory Visit: Payer: Self-pay

## 2020-10-23 ENCOUNTER — Encounter: Payer: Self-pay | Admitting: Internal Medicine

## 2020-10-23 ENCOUNTER — Ambulatory Visit: Payer: Medicaid Other | Admitting: Internal Medicine

## 2020-10-23 DIAGNOSIS — H8113 Benign paroxysmal vertigo, bilateral: Secondary | ICD-10-CM | POA: Insufficient documentation

## 2020-10-23 DIAGNOSIS — R7303 Prediabetes: Secondary | ICD-10-CM | POA: Diagnosis not present

## 2020-10-23 DIAGNOSIS — I10 Essential (primary) hypertension: Secondary | ICD-10-CM

## 2020-10-23 MED ORDER — MECLIZINE HCL 50 MG PO TABS: 50.0000 mg | ORAL_TABLET | Freq: Three times a day (TID) | ORAL | 0 refills | Status: DC | PRN

## 2020-10-23 NOTE — Progress Notes (Signed)
Internal Medicine Clinic Attending  I saw and evaluated the patient.  I personally confirmed the key portions of the history and exam documented by Dr. Gawaluck and I reviewed pertinent patient test results.  The assessment, diagnosis, and plan were formulated together and I agree with the documentation in the resident's note.  

## 2020-10-23 NOTE — Patient Instructions (Addendum)
Dear Chad Little,  Today we discussed your dizzy spells.  We performed the Dix-Hallpike maneuver and you were experiencing symptoms which is diagnostic for benign paroxysmal positional vertigo.  We also performed the Epley maneuver which when done repeatedly should begin to help your symptoms over time.  If your dizziness does not improve or worsens call our office we can evaluate you for possible specialist referral.

## 2020-10-23 NOTE — Assessment & Plan Note (Signed)
Continue dietary recommendations and lifestyle modifications including exercise.

## 2020-10-23 NOTE — Assessment & Plan Note (Signed)
Blood pressure controlled today we will continue current medications.

## 2020-10-23 NOTE — Progress Notes (Signed)
   CC: Dizzy spells for the past month  HPI:Chad Little is a 60 y.o. male who presents for evaluation of dizziness for the past month.. Please see individual problem based A/P for details.  Please see encounters tab for problem-based charting.   Problem List Items Addressed This Visit       Cardiovascular and Mediastinum   Hypertension    Blood pressure controlled today we will continue current medications.        Nervous and Auditory   BPPV (benign paroxysmal positional vertigo), bilateral    Likely recurrence of his history of BPPV given the lightheadedness symptom onset that he experiences with simple head movement, and also the fact that symptoms resolve after only a matter of seconds.  Dix-Hallpike maneuver performed during office visit.  Patient reported onset of dizziness which resolved shortly after the maneuver was complete.  Some nystagmus was noted during the maneuver as well.  Advised patient to perform Epley maneuvers at home 3 times per day until he notes resolution of symptoms.  Prescription for meclizine sent to take as needed.  Advised patient that if symptoms do not improve or worsen that we can refer him to a vestibular rehab.        Other   Prediabetes    Continue dietary recommendations and lifestyle modifications including exercise.        Depression, PHQ-9: Based on the patients  [place] Visit from 10/23/2020 in [place]  PHQ-9 Total Score 0      score we have 0.  Past Medical History:  Diagnosis Date   Arthritis    Enlarged prostate    Essential hypertension    GERD (gastroesophageal reflux disease)    Gout    diet controlled no recent flare ups   HIV infection ([place])    Review of Systems:   Review of Systems  HENT: Negative.    Eyes:  Negative for blurred vision, double vision, photophobia, pain, discharge and redness.  Respiratory: Negative.    Cardiovascular: Negative.    Gastrointestinal: Negative.   Musculoskeletal: Negative.   Neurological:  Positive for dizziness and headaches. Negative for tingling, tremors, sensory change, speech change, focal weakness, loss of consciousness and weakness.  Psychiatric/Behavioral: Negative.      Physical Exam: Vitals:   10/23/20 1005  BP: 122/82  Pulse: 85  Temp: 97.7 F (36.5 C)  TempSrc: Oral  SpO2: 98%  Weight: 252 lb 1.6 oz (114.4 kg)  Height: '6\' 5"'$  (1.956 m)     General: Alert and oriented no acute distress HEENT: Nystagmus noted bilaterally Dix-Hallpike maneuver.  Conjunctiva nl , antiicteric sclerae, moist mucous membranes, no exudate or erythema Cardiovascular: Normal rate, regular rhythm.  No murmurs, rubs, or gallops Pulmonary : Equal breath sounds, No wheezes, rales, or rhonchi Abdominal: soft, nontender,  bowel sounds present Ext: No edema in lower extremities, no tenderness to palpation of lower extremities.   Assessment & Plan:   See Encounters Tab for problem based charting.  Patient seen with Dr. Philipp Ovens

## 2020-10-23 NOTE — Assessment & Plan Note (Signed)
Likely recurrence of his history of BPPV given the lightheadedness symptom onset that he experiences with simple head movement, and also the fact that symptoms resolve after only a matter of seconds.  Dix-Hallpike maneuver performed during office visit.  Patient reported onset of dizziness which resolved shortly after the maneuver was complete.  Some nystagmus was noted during the maneuver as well.  Advised patient to perform Epley maneuvers at home 3 times per day until he notes resolution of symptoms.  Prescription for meclizine sent to take as needed.  Advised patient that if symptoms do not improve or worsen that we can refer him to a vestibular rehab.

## 2020-11-16 ENCOUNTER — Other Ambulatory Visit: Payer: Self-pay

## 2020-11-16 DIAGNOSIS — B20 Human immunodeficiency virus [HIV] disease: Secondary | ICD-10-CM

## 2020-11-20 ENCOUNTER — Other Ambulatory Visit: Payer: Medicaid Other

## 2020-11-20 ENCOUNTER — Other Ambulatory Visit: Payer: Self-pay

## 2020-11-20 DIAGNOSIS — B20 Human immunodeficiency virus [HIV] disease: Secondary | ICD-10-CM

## 2020-11-21 LAB — T-HELPER CELL (CD4) - (RCID CLINIC ONLY)
CD4 % Helper T Cell: 35 % (ref 33–65)
CD4 T Cell Abs: 581 /uL (ref 400–1790)

## 2020-11-22 LAB — CBC WITH DIFFERENTIAL/PLATELET
Absolute Monocytes: 728 cells/uL (ref 200–950)
Basophils Absolute: 78 cells/uL (ref 0–200)
Basophils Relative: 1.4 %
Eosinophils Absolute: 280 cells/uL (ref 15–500)
Eosinophils Relative: 5 %
HCT: 49.7 % (ref 38.5–50.0)
Hemoglobin: 16.8 g/dL (ref 13.2–17.1)
Lymphs Abs: 1708 cells/uL (ref 850–3900)
MCH: 31.9 pg (ref 27.0–33.0)
MCHC: 33.8 g/dL (ref 32.0–36.0)
MCV: 94.5 fL (ref 80.0–100.0)
MPV: 10.2 fL (ref 7.5–12.5)
Monocytes Relative: 13 %
Neutro Abs: 2806 cells/uL (ref 1500–7800)
Neutrophils Relative %: 50.1 %
Platelets: 245 10*3/uL (ref 140–400)
RBC: 5.26 10*6/uL (ref 4.20–5.80)
RDW: 13.1 % (ref 11.0–15.0)
Total Lymphocyte: 30.5 %
WBC: 5.6 10*3/uL (ref 3.8–10.8)

## 2020-11-22 LAB — COMPREHENSIVE METABOLIC PANEL
AG Ratio: 1.4 (calc) (ref 1.0–2.5)
ALT: 22 U/L (ref 9–46)
AST: 26 U/L (ref 10–35)
Albumin: 4.6 g/dL (ref 3.6–5.1)
Alkaline phosphatase (APISO): 99 U/L (ref 35–144)
BUN/Creatinine Ratio: 11 (calc) (ref 6–22)
BUN: 16 mg/dL (ref 7–25)
CO2: 28 mmol/L (ref 20–32)
Calcium: 10.2 mg/dL (ref 8.6–10.3)
Chloride: 101 mmol/L (ref 98–110)
Creat: 1.45 mg/dL — ABNORMAL HIGH (ref 0.70–1.35)
Globulin: 3.2 g/dL (calc) (ref 1.9–3.7)
Glucose, Bld: 96 mg/dL (ref 65–99)
Potassium: 4.3 mmol/L (ref 3.5–5.3)
Sodium: 138 mmol/L (ref 135–146)
Total Bilirubin: 0.6 mg/dL (ref 0.2–1.2)
Total Protein: 7.8 g/dL (ref 6.1–8.1)

## 2020-11-22 LAB — HIV-1 RNA QUANT-NO REFLEX-BLD
HIV 1 RNA Quant: <20 Copies/mL — ABNORMAL HIGH
HIV-1 RNA Quant, Log: <1.3 Log cps/mL — ABNORMAL HIGH

## 2020-11-28 ENCOUNTER — Encounter: Payer: Self-pay | Admitting: Student

## 2020-11-28 ENCOUNTER — Ambulatory Visit: Payer: Medicaid Other | Admitting: Student

## 2020-11-28 DIAGNOSIS — J302 Other seasonal allergic rhinitis: Secondary | ICD-10-CM

## 2020-11-28 DIAGNOSIS — H8113 Benign paroxysmal vertigo, bilateral: Secondary | ICD-10-CM | POA: Diagnosis not present

## 2020-11-28 MED ORDER — FLUTICASONE PROPIONATE 50 MCG/ACT NA SUSP: 1.0000 | Freq: Every day | NASAL | 1 refills | Status: DC | PRN

## 2020-11-28 NOTE — Progress Notes (Signed)
   CC: muffled hearing  HPI:  Chad Little is a 60 y.o. male with history listed below presenting to the [facility] for muffled hearing. Please see individualized problem based charting for full HPI.  Past Medical History:  Diagnosis Date   Arthritis    Enlarged prostate    Essential hypertension    GERD (gastroesophageal reflux disease)    Gout    diet controlled no recent flare ups   HIV infection ([place])     Review of Systems:  Negative aside from that listed in individualized problem based charting.  Physical Exam:  Vitals:   11/28/20 1603  BP: 126/90  Pulse: 98  Temp: 98.3 F (36.8 C)  TempSrc: Oral  SpO2: 98%  Weight: 255 lb (115.7 kg)   Physical Exam Constitutional:      Appearance: He is obese.  HENT:     Head: Normocephalic and atraumatic.     Right Ear: Tympanic membrane, ear canal and external ear normal. There is no impacted cerumen.     Left Ear: Tympanic membrane, ear canal and external ear normal. There is no impacted cerumen.     Ears:     Comments: Hearing symmetric. Rinne and Weber tests equivocal. No cerumen, effusions, or trauma noted in bilateral ears.    Nose: Congestion present.     Mouth/Throat:     Mouth: Mucous membranes are moist.     Pharynx: Oropharynx is clear. No oropharyngeal exudate.  Eyes:     Extraocular Movements: Extraocular movements intact.     Conjunctiva/sclera: Conjunctivae normal.     Pupils: Pupils are equal, round, and reactive to light.  Cardiovascular:     Rate and Rhythm: Normal rate and regular rhythm.     Pulses: Normal pulses.     Heart sounds: Normal heart sounds. No murmur heard.   No friction rub. No gallop.  Pulmonary:     Effort: Pulmonary effort is normal.     Breath sounds: Normal breath sounds. No wheezing, rhonchi or rales.  Musculoskeletal:        General: Normal range of motion.  Skin:    General: Skin is warm and dry.  Neurological:     General: No focal deficit present.     Mental Status:  He is alert and oriented to person, place, and time.  Psychiatric:        Mood and Affect: Mood normal.        Behavior: Behavior normal.     Assessment & Plan:   See Encounters Tab for problem based charting.  Patient seen with Dr. Evette Doffing

## 2020-11-28 NOTE — Assessment & Plan Note (Signed)
Patient with history of BPPV. States that his episodes have become less frequent with addition of Epley maneuvers when needed. 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 He does not like using meclizine as it makes him feel different. Discussed stopping this medication due to side effects and since his symptoms have become better controlled.  Plan: -stop meclizine -continue epley maneuvers as needed -if symptoms worsen or become more frequent, consider referral to vestibular PT

## 2020-11-28 NOTE — Patient Instructions (Signed)
Chad Little,  It was a pleasure seeing you in the clinic today.   We think your muffled hearing is likely related to your sinus congestion. Please use the flonase twice daily as needed to help with this. If your hearing worsens or fails to improve over the next few weeks, please contact the [facility].  Please call our clinic at [phone] if you have any questions or concerns. The best time to call is Monday-Friday from 9am-4pm, but there is someone available 24/7 at the same number. If you need medication refills, please notify your pharmacy one week in advance and they will send [place] a request.   Thank you for letting us take part in your care. We look forward to seeing you next time!

## 2020-11-28 NOTE — Assessment & Plan Note (Signed)
Patient with history of season allergies mainly in the form of sinus congestion which has been a chronic issue for him requiring flonase therapy. He reports that a few days ago, he was at a wedding at an elevation (5th story, outside) and it was fairly cold outside. The next morning, his sinus congestion worsened from his usual symptoms. Early yesterday, he began experiencing some muffled hearing and was not sure if this was connected to his BPPV.   On exam, he does not have true hearing loss (able to hear the same bilaterally) and Weber and Rinne testing was equivocal. He had no cerumen, effusion, or trauma bilaterally. Did have some nasal congestion.  Overall, patient's muffled hearing is likely related to sinus congestion. Recommended flonase BID prn to help control symptoms and flonase has been refilled. Advised to contact [facility] should hearing worsen or fail to improve over the next couple of weeks.  Plan: -flonase BID prn

## 2020-11-29 NOTE — Progress Notes (Signed)
Internal Medicine Clinic Attending  I saw and evaluated the patient.  I personally confirmed the key portions of the history and exam documented by Dr. Jinwala and I reviewed pertinent patient test results.  The assessment, diagnosis, and plan were formulated together and I agree with the documentation in the resident's note.  

## 2020-12-04 ENCOUNTER — Other Ambulatory Visit: Payer: Self-pay

## 2020-12-04 ENCOUNTER — Encounter: Payer: Self-pay | Admitting: Family

## 2020-12-04 ENCOUNTER — Ambulatory Visit (INDEPENDENT_AMBULATORY_CARE_PROVIDER_SITE_OTHER): Payer: Medicaid Other

## 2020-12-04 ENCOUNTER — Ambulatory Visit (INDEPENDENT_AMBULATORY_CARE_PROVIDER_SITE_OTHER): Payer: Medicaid Other | Admitting: Family

## 2020-12-04 VITALS — BP 147/94 | HR 82 | Temp 98.3°F | Ht 77.0 in | Wt 254.0 lb

## 2020-12-04 DIAGNOSIS — Z23 Encounter for immunization: Secondary | ICD-10-CM | POA: Diagnosis not present

## 2020-12-04 DIAGNOSIS — B20 Human immunodeficiency virus [HIV] disease: Secondary | ICD-10-CM

## 2020-12-04 DIAGNOSIS — Z0000 Encounter for general adult medical examination without abnormal findings: Secondary | ICD-10-CM

## 2020-12-04 MED ORDER — TIVICAY 50 MG PO TABS: 50.0000 mg | ORAL_TABLET | Freq: Every day | ORAL | 5 refills | Status: DC

## 2020-12-04 MED ORDER — EMTRICITABINE-TENOFOVIR AF 200-25 MG PO TABS: 1.0000 | ORAL_TABLET | Freq: Every day | ORAL | 5 refills | Status: DC

## 2020-12-04 NOTE — Assessment & Plan Note (Signed)
   Discussed importance of safe sexual practices and condom usage.  Condoms offered.  Working on his dentures and possibly implants for dental care.  COVID booster updated.

## 2020-12-04 NOTE — Patient Instructions (Signed)
Nice to see you.  Continue to take your medication daily as prescribed.  Refills have been sent to the pharmacy.  Plan for follow up in 4 months or sooner if needed with lab work 1-2 weeks prior to appointment.   Have a great day and stay safe!  

## 2020-12-04 NOTE — Assessment & Plan Note (Signed)
Chad Little continues to have well-controlled virus with good adherence and tolerance to his ART regimen of Tivicay and Descovy.  No signs/symptoms of opportunistic infection.  We reviewed lab work and discussed plan of care.  Continue current dose of Tivicay and Descovy.  Plan for follow-up in 4 months or sooner if needed with lab work 1 to 2 weeks prior to appointment.

## 2020-12-04 NOTE — Progress Notes (Signed)
   Covid-19 Vaccination Clinic  Name:  Chad Little    MRN: [id number] DOB: 1960-04-29  12/04/2020  Chad Little was observed post Covid-19 immunization for 15 minutes without incident. He was provided with Vaccine Information Sheet and instruction to access the V-Safe system.   Chad Little was instructed to call 911 with any severe reactions post vaccine: Difficulty breathing  Swelling of face and throat  A fast heartbeat  A bad rash all over body  Dizziness and weakness   Immunizations Administered     Name Date Dose VIS Date Route   Pfizer Covid-19 Vaccine Bivalent Booster 12/04/2020  9:45 AM 0.3 mL 10/04/2020 Intramuscular   Manufacturer: [place]   Lot: [id number]   [place]: [place], RN

## 2020-12-04 NOTE — Progress Notes (Signed)
Brief Narrative   Patient ID: ROBERT SPERL, male    DOB: 10-06-1960, 60 y.o.   MRN: [id number]  Mr. Jech is a 60 year old African-American male diagnosed with HIV-1 in 1998 with risk factor heterosexual contact.  Initial viral load and CD4 count unavailable.  Genotype from 2009 with no significant medication resistance patterns.  No history of opportunistic infection. [id number] negative. ART regimen of Tivicay and Descovy.   Subjective:    Chief Complaint  Patient presents with   Follow-up    Declined condoms     HPI:  EMANUELLE BASTOS is a 60 y.o. male with HIV disease last seen on 07/24/2020 with well-controlled virus and good adherence and tolerance to his ART regimen of Tivicay and Descovy.  Viral load at the time was undetectable with CD4 count of 701.  Most recent blood work completed on 11/20/2020 with viral load that remains undetectable and CD4 count of 581.  Kidney function stable and liver function, and electrolytes within normal ranges. Here today for routine follow up.   Mr. Kohrs continues to take his Tivicay and Descovy daily as prescribed with no adverse side effects.  Overall feeling well today with no new concerns/complaints. Denies fevers, chills, night sweats, headaches, changes in vision, neck pain/stiffness, nausea, diarrhea, vomiting, lesions or rashes.  Mr. Pflaum has no problems obtaining medication from the pharmacy remains covered by Medicaid.  Denies feelings of being down, depressed, or hopeless recently.  No current recreational illicit drug use, tobacco use, or alcohol consumption.  Condoms offered.  Interested in [place] booster.  No Known Allergies    Outpatient Medications Prior to Visit  Medication Sig Dispense Refill   atorvastatin (LIPITOR) 10 MG tablet Take 1 tablet (10 mg total) by mouth daily. 30 tablet 11   fluticasone (FLONASE) 50 MCG/ACT nasal spray Place 1 spray into both nostrils daily as needed for allergies or rhinitis. 15.8  mL 1   olmesartan-hydrochlorothiazide (BENICAR HCT) 40-25 MG tablet Take 1 tablet by mouth daily. 90 tablet 3   pantoprazole (PROTONIX) 40 MG tablet TAKE 1 TABLET(40 MG) BY MOUTH DAILY 30 tablet 5   dolutegravir (TIVICAY) 50 MG tablet Take 1 tablet (50 mg total) by mouth daily. 30 tablet 5   emtricitabine-tenofovir AF (DESCOVY) 200-25 MG tablet Take 1 tablet by mouth daily. 30 tablet 5   betamethasone valerate ointment (VALISONE) 0.1 % Apply 1 application topically 2 (two) times daily. (Patient not taking: Reported on 12/04/2020) 30 g 0   No facility-administered medications prior to visit.     Past Medical History:  Diagnosis Date   Arthritis    Enlarged prostate    Essential hypertension    GERD (gastroesophageal reflux disease)    Gout    diet controlled no recent flare ups   HIV infection [facility])      Past Surgical History:  Procedure Laterality Date   APPENDECTOMY  2005   open   Right elbow surgery  dec3 2020   in [place]   TRANSURETHRAL RESECTION OF PROSTATE N/A 02/14/2020   Procedure: TRANSURETHRAL RESECTION OF THE PROSTATE (TURP), BIPOLAR;  Surgeon: Janith Lima, MD;  Location: [facility];  Service: Urology;  Laterality: N/A;      Review of Systems  Constitutional:  Negative for appetite change, chills, fatigue, fever and unexpected weight change.  Eyes:  Negative for visual disturbance.  Respiratory:  Negative for cough, chest tightness, shortness of breath and wheezing.   Cardiovascular:  Negative for chest pain  and leg swelling.  Gastrointestinal:  Negative for abdominal pain, constipation, diarrhea, nausea and vomiting.  Genitourinary:  Negative for dysuria, flank pain, frequency, genital sores, hematuria and urgency.  Skin:  Negative for rash.  Allergic/Immunologic: Negative for immunocompromised state.  Neurological:  Negative for dizziness and headaches.     Objective:    BP (!) 147/94   Pulse 82   Temp 98.3 F (36.8 C) (Oral)   Ht  6\' 5"  (1.956 m)   Wt 254 lb (115.2 kg)   SpO2 97%   BMI 30.12 kg/m  Nursing note and vital signs reviewed.  Physical Exam Constitutional:      General: He is not in acute distress.    Appearance: He is well-developed.  Eyes:     Conjunctiva/sclera: Conjunctivae normal.  Cardiovascular:     Rate and Rhythm: Normal rate and regular rhythm.     Heart sounds: Normal heart sounds. No murmur heard.   No friction rub. No gallop.  Pulmonary:     Effort: Pulmonary effort is normal. No respiratory distress.     Breath sounds: Normal breath sounds. No wheezing or rales.  Chest:     Chest wall: No tenderness.  Abdominal:     General: Bowel sounds are normal.     Palpations: Abdomen is soft.     Tenderness: There is no abdominal tenderness.  Musculoskeletal:     Cervical back: Neck supple.  Lymphadenopathy:     Cervical: No cervical adenopathy.  Skin:    General: Skin is warm and dry.     Findings: No rash.  Neurological:     Mental Status: He is alert and oriented to person, place, and time.  Psychiatric:        Behavior: Behavior normal.        Thought Content: Thought content normal.        Judgment: Judgment normal.     Depression screen [facility] 2/9 12/04/2020 11/28/2020 10/23/2020 08/08/2020 07/24/2020  Decreased Interest 0 0 0 0 0  Down, Depressed, Hopeless 0 0 0 0 0  PHQ - 2 Score 0 0 0 0 0  Altered sleeping - - 0 0 -  Tired, decreased energy - - 0 0 -  Change in appetite - - 0 0 -  Feeling bad or failure about yourself  - - 0 0 -  Trouble concentrating - - 0 0 -  Moving slowly or fidgety/restless - - 0 0 -  Suicidal thoughts - - 0 0 -  PHQ-9 Score - - 0 0 -  Difficult doing work/chores - Not difficult at all Not difficult at all Not difficult at all -  Some recent data might be hidden       Assessment & Plan:    Patient Active Problem List   Diagnosis Date Noted   BPPV (benign paroxysmal positional vertigo), bilateral 10/23/2020   Superficial vein thrombosis  08/08/2020   Seasonal allergies 07/06/2020   GERD (gastroesophageal reflux disease) 06/13/2020   S/P TURP 03/22/2020   Healthcare maintenance 05/25/2019   Prediabetes 04/14/2019   Hypertension 10/31/2014   HIV disease ([place]) 12/28/2007   Osteoarthritis 12/28/2007     Problem List Items Addressed This Visit       Other   HIV disease ([place]) - Primary    Mr. Cozzolino continues to have well-controlled virus with good adherence and tolerance to his ART regimen of Tivicay and Descovy.  No signs/symptoms of opportunistic infection.  We reviewed lab work and discussed plan  of care.  Continue current dose of Tivicay and Descovy.  Plan for follow-up in 4 months or sooner if needed with lab work 1 to 2 weeks prior to appointment.      Relevant Medications   emtricitabine-tenofovir AF (DESCOVY) 200-25 MG tablet   dolutegravir (TIVICAY) 50 MG tablet   Other Relevant Orders   HIV-1 RNA quant-no reflex-bld   Comprehensive metabolic panel   T-helper cell (CD4)- ([facility] clinic only)   Healthcare maintenance    Discussed importance of safe sexual practices and condom usage.  Condoms offered. Working on his dentures and possibly implants for dental care. COVID booster updated.        I am having [name] maintain his betamethasone valerate ointment, olmesartan-hydrochlorothiazide, pantoprazole, atorvastatin, fluticasone, emtricitabine-tenofovir AF, and Tivicay.   Meds ordered this encounter  Medications   emtricitabine-tenofovir AF (DESCOVY) 200-25 MG tablet    Sig: Take 1 tablet by mouth daily.    Dispense:  30 tablet    Refill:  5    Order Specific Question:   Supervising Provider    Answer:   Baxter Flattery, CYNTHIA [[id number]]   dolutegravir (TIVICAY) 50 MG tablet    Sig: Take 1 tablet (50 mg total) by mouth daily.    Dispense:  30 tablet    Refill:  5    Order Specific Question:   Supervising Provider    Answer:   Carlyle Basques [[id number]]      Follow-up: Return in about 4 months  (around 04/03/2021), or if symptoms worsen or fail to improve.   Terri Piedra, MSN, FNP-C Nurse Practitioner [facility] for Infectious Disease [address] number: [phone]

## 2021-01-04 DIAGNOSIS — Z419 Encounter for procedure for purposes other than remedying health state, unspecified: Secondary | ICD-10-CM | POA: Diagnosis not present

## 2021-01-15 ENCOUNTER — Ambulatory Visit (HOSPITAL_COMMUNITY)
Admission: RE | Admit: 2021-01-15 | Discharge: 2021-01-15 | Disposition: A | Discharge status: Home / Self Care | Payer: Medicaid Other | Source: Ambulatory Visit | Attending: Internal Medicine | Admitting: Internal Medicine

## 2021-01-15 ENCOUNTER — Other Ambulatory Visit: Payer: Self-pay

## 2021-01-15 ENCOUNTER — Ambulatory Visit: Payer: Medicaid Other | Admitting: Internal Medicine

## 2021-01-15 ENCOUNTER — Encounter: Payer: Self-pay | Admitting: Internal Medicine

## 2021-01-15 VITALS — BP 129/93 | HR 77 | Wt 257.3 lb

## 2021-01-15 DIAGNOSIS — M79645 Pain in left finger(s): Secondary | ICD-10-CM | POA: Insufficient documentation

## 2021-01-15 DIAGNOSIS — S93432A Sprain of tibiofibular ligament of left ankle, initial encounter: Secondary | ICD-10-CM | POA: Diagnosis not present

## 2021-01-15 DIAGNOSIS — M19042 Primary osteoarthritis, left hand: Secondary | ICD-10-CM | POA: Diagnosis not present

## 2021-01-15 MED ORDER — NAPROXEN 500 MG PO TABS: 500.0000 mg | ORAL_TABLET | Freq: Two times a day (BID) | ORAL | 0 refills | Status: AC

## 2021-01-15 NOTE — Assessment & Plan Note (Addendum)
Yesterday patient had inversion of left ankle stepping down off of a stair. He immediately had pain and didn't want to place weight on foot. He iced his ankle overnight with improvement. Reports feeling a lot better this morning but continues to have some tenderness.   Assessment/Plan: Sprain of anterior tibiofibular ligament, based on exam patient sprained anterior tibiofibular ligament. Improving already after icing. Patient will wrap ankle with ace bandage for support and elevation to help with mild edema. Prescribed NSAIDS and if patient does not continue to improve he will call office and I will send for imaging.

## 2021-01-15 NOTE — Patient Instructions (Signed)
Thank you for trusting me with your care. To recap, today we discussed the following:   1. Pain of left thumb - DG Hand Complete Left - naproxen (NAPROSYN) 500 MG tablet; Take 1 tablet (500 mg total) by mouth 2 (two) times daily with a meal for 7 days.  Dispense: 14 tablet; Refill: 0  2. Sprain of tibiofibular ligament of left ankle, initial encounter - Continue RICE therapy  - naproxen (NAPROSYN) 500 MG tablet; Take 1 tablet (500 mg total) by mouth 2 (two) times daily with a meal for 7 days.  Dispense: 14 tablet; Refill: 0 - If it does not continue to improve call the clinic and I will send you for imaging.

## 2021-01-15 NOTE — Progress Notes (Signed)
   CC: Pain of left thumb, sprain of tibiofibular ligament of left ankle  HPI:[name] is a 60 y.o. male who presents for evaluation of pain of left thumb and left ankle sprain. Please see individual problem based A/P for details.  Past Medical History:  Diagnosis Date   Arthritis    Enlarged prostate    Essential hypertension    GERD (gastroesophageal reflux disease)    Gout    diet controlled no recent flare ups   HIV infection ([place])    Review of Systems:   Review of Systems  Constitutional:  Negative for chills and fever.  Musculoskeletal:  Positive for joint pain. Negative for falls.    Physical Exam: Vitals:   01/15/21 0938 01/15/21 0950  BP: (!) 138/98 (!) 129/93  Pulse: 78 77  SpO2: 98%   Weight: 257 lb 4.8 oz (116.7 kg)    General: NAD, walking with limp, appears to have pain with bearing all weight on left foot Left ankle No gross deformity, mild swelling , noecchymoses FROM TTP anterior lateral side of foot, over  Negative ant drawer and negative talar tilt.   Negative syndesmotic compression. Thompsons test negative. NV intact distally. Left thumb with mild erythema and edema at base of thumb, TTP at [facility] joint. FROM of thumb    Assessment & Plan:   See Encounters Tab for problem based charting.  Patient discussed with Dr. Dareen Little

## 2021-01-16 ENCOUNTER — Telehealth: Payer: Self-pay | Admitting: Internal Medicine

## 2021-01-16 MED ORDER — THUMB BRACE MISC: 0 refills | Status: DC

## 2021-01-16 NOTE — Telephone Encounter (Signed)
Patient called and we reviewed results of xray of thumb. Patient has osteoarthritis of left thumb. Improving with Naproxen since yesterday. Sending thumb brace prescription to pharmacy.

## 2021-01-17 NOTE — Progress Notes (Signed)
Internal Medicine Clinic Attending  Case discussed with Dr. Steen  At the time of the visit.  We reviewed the resident's history and exam and pertinent patient test results.  I agree with the assessment, diagnosis, and plan of care documented in the resident's note.  

## 2021-02-04 DIAGNOSIS — Z419 Encounter for procedure for purposes other than remedying health state, unspecified: Secondary | ICD-10-CM | POA: Diagnosis not present

## 2021-02-06 ENCOUNTER — Telehealth: Payer: Self-pay | Admitting: Internal Medicine

## 2021-02-06 DIAGNOSIS — M79645 Pain in left finger(s): Secondary | ICD-10-CM

## 2021-02-06 NOTE — Telephone Encounter (Signed)
Spoke with Dr. Court Joy, will place referral to sport's med.

## 2021-02-06 NOTE — Telephone Encounter (Signed)
Pt called to request a Referral to Sports Medicine.  Pt seen on 01/05/2021 and had completed his Xray. Pt states his Left thumb/Hand is no better and would like to go ahead with a Sports Medicine Referral if possible.  Please advise if a referral can be placed.

## 2021-02-13 ENCOUNTER — Other Ambulatory Visit: Payer: Self-pay | Admitting: Student

## 2021-02-13 DIAGNOSIS — J302 Other seasonal allergic rhinitis: Secondary | ICD-10-CM

## 2021-02-14 ENCOUNTER — Ambulatory Visit: Payer: Medicaid Other | Admitting: Sports Medicine

## 2021-02-14 ENCOUNTER — Ambulatory Visit: Payer: Self-pay

## 2021-02-14 VITALS — BP 151/98 | Ht 77.0 in | Wt 257.0 lb

## 2021-02-14 DIAGNOSIS — M79645 Pain in left finger(s): Secondary | ICD-10-CM

## 2021-02-14 DIAGNOSIS — M1812 Unilateral primary osteoarthritis of first carpometacarpal joint, left hand: Secondary | ICD-10-CM | POA: Diagnosis not present

## 2021-02-14 MED ORDER — METHYLPREDNISOLONE ACETATE 40 MG/ML IJ SUSP
20.0000 mg | Freq: Once | INTRAMUSCULAR | Status: AC
  Administered 2021-02-14: 20 mg via INTRA_ARTICULAR

## 2021-02-14 NOTE — Progress Notes (Signed)
PCP: Madalyn Rob, MD  Subjective:   HPI: Chad Little is a very pleasant 61 y.o. male here for acute on chronic left thumb pain.  He has had left thumb pain for a few months now.  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  About 1 month ago he was active with his work and the pain in the thumb became exacerbated. The pain is localized to the basal aspect of the left thumb.  He is right-hand dominant.  The pain is described as a dull achy pain throughout the day, but he will get a sharp pain with certain movements or gripping of the thumb.  He saw his primary care physician and they obtained x-rays, which I did independently review which showed Central joint arthritis.  He has tried naproxen, a thumb brace, as well as Voltaren gel and these have significantly helped his pain.  He denies any redness, erythema or warmth.   Past Medical History:  Diagnosis Date   Arthritis    Enlarged prostate    Essential hypertension    GERD (gastroesophageal reflux disease)    Gout    diet controlled no recent flare ups   HIV infection ([place])     Current Outpatient Medications on File Prior to Visit  Medication Sig Dispense Refill   atorvastatin (LIPITOR) 10 MG tablet Take 1 tablet (10 mg total) by mouth daily. 30 tablet 11   betamethasone valerate ointment (VALISONE) 0.1 % Apply 1 application topically 2 (two) times daily. (Patient not taking: Reported on 12/04/2020) 30 g 0   dolutegravir (TIVICAY) 50 MG tablet Take 1 tablet (50 mg total) by mouth daily. 30 tablet 5   Elastic Bandages & Supports (THUMB BRACE) MISC Wear daily until symptom start to improve 1 each 0   emtricitabine-tenofovir AF (DESCOVY) 200-25 MG tablet Take 1 tablet by mouth daily. 30 tablet 5   fluticasone (FLONASE) 50 MCG/ACT nasal spray Place 1 spray into both nostrils daily as needed for allergies or rhinitis. 15.8 mL 1   olmesartan-hydrochlorothiazide (BENICAR  HCT) 40-25 MG tablet Take 1 tablet by mouth daily. 90 tablet 3   pantoprazole (PROTONIX) 40 MG tablet TAKE 1 TABLET(40 MG) BY MOUTH DAILY 30 tablet 5   No current facility-administered medications on file prior to visit.    Past Surgical History:  Procedure Laterality Date   APPENDECTOMY  2005   open   Right elbow surgery  dec3 2020   in [place]   TRANSURETHRAL RESECTION OF PROSTATE N/A 02/14/2020   Procedure: TRANSURETHRAL RESECTION OF THE PROSTATE (TURP), BIPOLAR;  Surgeon: Janith Lima, MD;  Location: [facility];  Service: Urology;  Laterality: N/A;    No Known Allergies  BP (!) 151/98    Ht 6\' 5"  (1.956 m)    Wt 257 lb (116.6 kg)    BMI 30.48 kg/m   No flowsheet data found.  No flowsheet data found.      Objective:  Physical Exam:  Gen: Well-appearing, in no acute distress; non-toxic CV: Regular Rate. Well-perfused. Warm.  Resp: Breathing unlabored on room air; no wheezing. Psych: Fluid speech in conversation; appropriate affect; normal thought process Neuro: Sensation intact throughout. No gross coordination deficits.  MSK:  - Left thumb: Inspection of the left thumb does not yield any erythema, ecchymosis or effusion.  There is some mild crepitus at the basal aspect of the thumb.  Full range of  motion about the thumb.  No instability upon LCL/RCL testing.  Strength testing intact.  Neurovascular intact distally. + CMC grind test   Imaging:  DG Hand Complete Left CLINICAL DATA:  Left thumb pain. Pain at the [facility] joint. Patient reports left thumb pain for 1 month, worse this week.  EXAM: LEFT HAND - COMPLETE 3+ VIEW  COMPARISON:  None.  FINDINGS: Osteoarthritis of the thumb carpal metacarpal joint with joint space narrowing, osteophytes, subchondral cystic change and slight radial subluxation. Additional osteoarthritis involving the metacarpal phalangeal joint and interphalangeal joint of the thumb, as well as the distal interphalangeal  joints of the index finger. No erosive change or periostitis. There is also mild radiocarpal joint space narrowing and spurring. No fracture. Unremarkable soft tissues. No soft tissue calcifications.  IMPRESSION: Osteoarthritis of the thumb, most prominently affecting the carpal metacarpal joint. No evidence of inflammatory arthropathy.  Electronically Signed   By: Keith Rake M.D.   On: 01/16/2021 12:54   *Independent review of the left thumb x-rays demonstrates at least moderate osteoarthritis of the [facility] joint with narrowing and osteophytic change.   Assessment & Plan:  1. Left thumb pain 2. CMC joint arthritis, left thumb  Procedure: Ultrasound-guided CMC joint injection, left thumb After informed written consent timeout was performed, patient was seated on exam table. The patient's left hand was placed on stable surface with medial hand on table.  Identification of the [facility] joint was performed in long axis under ultrasound guidance. Identification of the carpal-metacarpal joint with evidence of arthritic change and bony spurs was noted. Utilizing an out-of-plane approach, a 25-gauge, 5/8" needle was inserted under ultrasound guidance utilizing walk-down technique and visualized with needle entrance into the [facility] joint. The joint was subsequently injected with a 0.5:0.5 cc of lidocaine:depomedrol combination with physical visualization of injectate spread into the joint space.  Patient tolerated the procedure well without immediate complications.  Plan:  -As patient has failed conservative management, through shared decision making we elected to inject the left [facility] joint with corticosteroid injection today.  Patient tolerated well. -Discussed using ice and over-the-counter Tylenol/ibuprofen if needed for postinjection pain -Did recommend use of his CMC thumb brace over the next week until this calms down -Follow-up as needed  Elba Barman, DO PGY-4, [place]  Addendum:  I was the preceptor for this visit and available for immediate consultation.  Karlton Lemon MD Kirt Boys

## 2021-02-20 ENCOUNTER — Telehealth: Payer: Self-pay | Admitting: Sports Medicine

## 2021-02-20 NOTE — Telephone Encounter (Signed)
Called patient today, answered and confirmed identity. Patient called into clinic this AM about some residual pain in thumb. His dorsal thumb joint is much improved. Still having some pain at the volar surface just under 1-week from initial injection. Discussed icing, may take tylenol/ibuprofen in near-time. Will allow a few more days to see if CS fully kicks in. If still painful by end of week, he will make appt. Patient is agreeable to this plan. All questions answered.  Elba Barman, DO PGY-4, Sports Medicine Fellow [place]

## 2021-02-28 ENCOUNTER — Ambulatory Visit: Payer: Self-pay

## 2021-02-28 ENCOUNTER — Ambulatory Visit: Payer: Medicaid Other | Admitting: Sports Medicine

## 2021-02-28 VITALS — BP 132/90 | Ht 77.0 in | Wt 260.0 lb

## 2021-02-28 DIAGNOSIS — M65312 Trigger thumb, left thumb: Secondary | ICD-10-CM

## 2021-02-28 DIAGNOSIS — M79645 Pain in left finger(s): Secondary | ICD-10-CM

## 2021-02-28 MED ORDER — METHYLPREDNISOLONE ACETATE 40 MG/ML IJ SUSP
20.0000 mg | Freq: Once | INTRAMUSCULAR | Status: AC
  Administered 2021-02-28: 10:00:00 20 mg via INTRA_ARTICULAR

## 2021-02-28 NOTE — Progress Notes (Signed)
PCP: Madalyn Rob, MD  Subjective:   HPI: Patient is a 61 y.o. male here for follow-up of left thumb.  During her last visit on 02/14/2021 patient was having thumb pain more so around the [facility] joint but also on the volar aspect of the thumb.  He subsequently underwent [facility] joint injection we did relieve his pain at the base of the thumb, but he still continues with pain on the volar aspect of the thumb pointing near the A1 pulley.  Has noticed some redness around this area.  He denies any triggering or catching events, but has significant pain as he bends the thumb and if he grips anything over that region.  He has noticed a small palpable nodule in this area.  He continues in his thumb brace, but has been unable to go back to work because the pain has been significant.  Denies any fever chills himself, no spreading erythema around this area.  Past Medical History:  Diagnosis Date   Arthritis    Enlarged prostate    Essential hypertension    GERD (gastroesophageal reflux disease)    Gout    diet controlled no recent flare ups   HIV infection ([place])     Current Outpatient Medications on File Prior to Visit  Medication Sig Dispense Refill   atorvastatin (LIPITOR) 10 MG tablet Take 1 tablet (10 mg total) by mouth daily. 30 tablet 11   betamethasone valerate ointment (VALISONE) 0.1 % Apply 1 application topically 2 (two) times daily. (Patient not taking: Reported on 12/04/2020) 30 g 0   dolutegravir (TIVICAY) 50 MG tablet Take 1 tablet (50 mg total) by mouth daily. 30 tablet 5   Elastic Bandages & Supports (THUMB BRACE) MISC Wear daily until symptom start to improve 1 each 0   emtricitabine-tenofovir AF (DESCOVY) 200-25 MG tablet Take 1 tablet by mouth daily. 30 tablet 5   fluticasone (FLONASE) 50 MCG/ACT nasal spray Place 1 spray into both nostrils daily. 11.1 g 3   olmesartan-hydrochlorothiazide (BENICAR HCT) 40-25 MG tablet Take 1 tablet by mouth daily. 90 tablet 3   pantoprazole (PROTONIX)  40 MG tablet TAKE 1 TABLET(40 MG) BY MOUTH DAILY 30 tablet 5   No current facility-administered medications on file prior to visit.    Past Surgical History:  Procedure Laterality Date   APPENDECTOMY  2005   open   Right elbow surgery  dec3 2020   in [place]   TRANSURETHRAL RESECTION OF PROSTATE N/A 02/14/2020   Procedure: TRANSURETHRAL RESECTION OF THE PROSTATE (TURP), BIPOLAR;  Surgeon: Janith Lima, MD;  Location: [facility];  Service: Urology;  Laterality: N/A;    No Known Allergies  BP 132/90    Ht 6\' 5"  (1.956 m)    Wt 260 lb (117.9 kg)    BMI 30.83 kg/m   No flowsheet data found.  No flowsheet data found.      Objective:  Physical Exam:  Gen: Well-appearing, in no acute distress; non-toxic CV: Regular Rate. Well-perfused. Warm.  Resp: Breathing unlabored on room air; no wheezing. Psych: Fluid speech in conversation; appropriate affect; normal thought process Neuro: Sensation intact throughout. No gross coordination deficits.  MSK:  - Left thumb: + TTP with palpable nodule over the A1 pulley of the volar thumb.  There is a mild degree of warmth in this area without significant erythema or ecchymosis.  No soft tissue swelling or effusion noted.  There is full range of motion about the thumb without any triggering  episodes.  No instability to varus or valgus stress.  Grip strength intact.  Neurovascular intact.    Assessment & Plan:  1. Left thumb pain - early trigger thumb vs. Tenosynovitis  2. Trigger thumb, early   Procedure: Trigger Thumb Injection, Left After discussion on R/B/I and informed written consent, the A1 pulley was identified with palpation, firm palpable nodule was present and location was marked. This area was cleaned with Betadine swab and alcohol swabs. A 25-gauge, 5/8" needle was inserted into the A1 pulley area and was subsequently injected with a 0.5cc:0.5cc mixture of Lidocaine 1% and  Methylprednisolone. Patient tolerated  procedure well and was observed for at least 5 minutes after injection with resolution of pain and improved ROM.   Plan:  - trigger thumb injection today, tolerated well - continue thumb brace for the next 2 days, then come out as pain allows - ice and/or tylenol for any post-injection pain - follow-up PRN  Chad Barman, DO PGY-4, [place]  Addendum:  I was the preceptor for this visit and available for immediate consultation.  Karlton Lemon MD Kirt Boys

## 2021-03-07 DIAGNOSIS — Z419 Encounter for procedure for purposes other than remedying health state, unspecified: Secondary | ICD-10-CM | POA: Diagnosis not present

## 2021-03-13 ENCOUNTER — Other Ambulatory Visit: Payer: Medicaid Other

## 2021-03-13 ENCOUNTER — Other Ambulatory Visit: Payer: Self-pay

## 2021-03-13 DIAGNOSIS — B20 Human immunodeficiency virus [HIV] disease: Secondary | ICD-10-CM

## 2021-03-14 ENCOUNTER — Other Ambulatory Visit: Payer: Self-pay

## 2021-03-14 ENCOUNTER — Ambulatory Visit: Payer: Medicaid Other | Admitting: Internal Medicine

## 2021-03-14 ENCOUNTER — Encounter: Payer: Self-pay | Admitting: Internal Medicine

## 2021-03-14 VITALS — BP 116/87 | HR 69 | Temp 97.8°F | Ht 77.0 in | Wt 260.6 lb

## 2021-03-14 DIAGNOSIS — R7303 Prediabetes: Secondary | ICD-10-CM | POA: Diagnosis not present

## 2021-03-14 DIAGNOSIS — I10 Essential (primary) hypertension: Secondary | ICD-10-CM | POA: Diagnosis not present

## 2021-03-14 DIAGNOSIS — K219 Gastro-esophageal reflux disease without esophagitis: Secondary | ICD-10-CM | POA: Diagnosis not present

## 2021-03-14 DIAGNOSIS — Z0000 Encounter for general adult medical examination without abnormal findings: Secondary | ICD-10-CM

## 2021-03-14 MED ORDER — PANTOPRAZOLE SODIUM 40 MG PO TBEC: 40.0000 mg | DELAYED_RELEASE_TABLET | Freq: Two times a day (BID) | ORAL | 2 refills | Status: DC

## 2021-03-14 NOTE — Progress Notes (Signed)
° °  CC: GERD  HPI:  [name] is a 61 y.o. PMH noted below, who presents to the [facility] with complaints of GERD. To see the management of his acute and chronic conditions, please refer to the A&P note under the encounters tab.   Past Medical History:  Diagnosis Date   Arthritis    Enlarged prostate    Essential hypertension    GERD (gastroesophageal reflux disease)    Gout    diet controlled no recent flare ups   HIV infection ([facility])    Review of Systems:  Positive for heartburn, negative for dysphagia, odynophagia, early satiety, weight loss  Physical Exam: Gen: WNWD in NAD HEENT: normocephalic atraumatic CV: RRR, no m/r/g, no pedal edema   Resp: CTAB, normal WOB  GI: soft, nontender MSK: moves all extremities without difficulty Skin:warm and dry Neuro:alert answering questions appropriately, normal gait Psych: normal affect   Assessment & Plan:   See Encounters Tab for problem based charting.  Patient discussed with Dr. Evette Doffing

## 2021-03-14 NOTE — Assessment & Plan Note (Signed)
Patient reports worsening of his GERD symptoms over the last several days. He thinks it is related to something he ate but wanted to get it checked out since it had been well controlled. He is taking his protonix once a day. Denies any alarm symptoms however given his age and his HIV and the sudden worsening of his symptoms despite adequate PPI dosing will refer him to GI to have endoscopic evaluation  Plan: - increase PPI dosing to 40 twice a day and follow up in 1 month - GI referral - dietary modification recommendations provided

## 2021-03-14 NOTE — Assessment & Plan Note (Signed)
Not yet due for A1c recheck.  - check A1c in one month.

## 2021-03-14 NOTE — Assessment & Plan Note (Signed)
Patient reports having had the flu shot already this year.

## 2021-03-14 NOTE — Patient Instructions (Addendum)
Chad Little  It was a pleasure seeing you in the clinic today.   We talked about your acid reflux symptoms. For the next month please take your protonix twice daily. We are going to have you see the stomach doctors as well to see what might be causing the worsening of your symptoms.  Please follow up in about a month to assess your symptoms.  Please call our clinic at [phone] if you have any questions or concerns. The best time to call is Monday-Friday from 9am-4pm, but there is someone available 24/7 at the same number. If you need medication refills, please notify your pharmacy one week in advance and they will send [place] a request.   Thank you for letting us take part in your care. We look forward to seeing you next time!

## 2021-03-14 NOTE — Assessment & Plan Note (Signed)
Blood pressure initially elevated on arrival, came down to 116/87 on recheck.   -continue benicar 40-25

## 2021-03-15 LAB — COMPREHENSIVE METABOLIC PANEL
AG Ratio: 1.3 (calc) (ref 1.0–2.5)
ALT: 19 U/L (ref 9–46)
AST: 24 U/L (ref 10–35)
Albumin: 4.5 g/dL (ref 3.6–5.1)
Alkaline phosphatase (APISO): 111 U/L (ref 35–144)
BUN/Creatinine Ratio: 10 (calc) (ref 6–22)
BUN: 14 mg/dL (ref 7–25)
CO2: 29 mmol/L (ref 20–32)
Calcium: 10.1 mg/dL (ref 8.6–10.3)
Chloride: 103 mmol/L (ref 98–110)
Creat: 1.44 mg/dL — ABNORMAL HIGH (ref 0.70–1.35)
Globulin: 3.5 g/dL (calc) (ref 1.9–3.7)
Glucose, Bld: 88 mg/dL (ref 65–99)
Potassium: 4.1 mmol/L (ref 3.5–5.3)
Sodium: 141 mmol/L (ref 135–146)
Total Bilirubin: 0.7 mg/dL (ref 0.2–1.2)
Total Protein: 8 g/dL (ref 6.1–8.1)

## 2021-03-15 LAB — T-HELPER CELLS (CD4) COUNT (NOT AT ARMC)
Absolute CD4: 823 cells/uL (ref 490–1740)
CD4 T Helper %: 37 % (ref 30–61)
Total lymphocyte count: 2239 cells/uL (ref 850–3900)

## 2021-03-15 LAB — HIV-1 RNA QUANT-NO REFLEX-BLD
HIV 1 RNA Quant: <20 Copies/mL — ABNORMAL HIGH
HIV-1 RNA Quant, Log: <1.3 Log cps/mL — ABNORMAL HIGH

## 2021-03-15 NOTE — Progress Notes (Signed)
Internal Medicine Clinic Attending ° °Case discussed with Dr. DeMaio  At the time of the visit.  We reviewed the resident’s history and exam and pertinent patient test results.  I agree with the assessment, diagnosis, and plan of care documented in the resident’s note. ° ° °

## 2021-03-22 ENCOUNTER — Encounter: Payer: Self-pay | Admitting: Gastroenterology

## 2021-03-27 ENCOUNTER — Encounter: Payer: Medicaid Other | Admitting: Family

## 2021-04-04 DIAGNOSIS — Z419 Encounter for procedure for purposes other than remedying health state, unspecified: Secondary | ICD-10-CM | POA: Diagnosis not present

## 2021-04-11 ENCOUNTER — Encounter: Payer: Self-pay | Admitting: Gastroenterology

## 2021-04-11 ENCOUNTER — Ambulatory Visit (INDEPENDENT_AMBULATORY_CARE_PROVIDER_SITE_OTHER): Payer: Medicaid Other | Admitting: Gastroenterology

## 2021-04-11 VITALS — BP 132/100 | HR 79 | Ht 77.0 in | Wt 260.0 lb

## 2021-04-11 DIAGNOSIS — Z1211 Encounter for screening for malignant neoplasm of colon: Secondary | ICD-10-CM

## 2021-04-11 DIAGNOSIS — K219 Gastro-esophageal reflux disease without esophagitis: Secondary | ICD-10-CM | POA: Diagnosis not present

## 2021-04-11 DIAGNOSIS — Z1212 Encounter for screening for malignant neoplasm of rectum: Secondary | ICD-10-CM

## 2021-04-11 NOTE — Progress Notes (Signed)
? ?HPI : Chad Little is a very pleasant 61 year old male with a history of arthritis, HTN and GERD who is referred to [place] by Dr. Madalyn Rob for worsening GERD symptoms.  He tells me he has been having GERD symptoms for at least 8 years now.  His GERD symptoms consist of burning discomfort in his chest and upper abdomen as well as as regurgitation and regurgitation of stomach contents when he bends over.  His symptoms were well controlled for a long time with a once daily medication (patient does not recall the name).  His medication was switched to Protonix about a year ago, and this seemed to work for a while.  About 2 months ago, his symptoms seem to abruptly worsen and are not responsive to Protonix at all.  He had this change in symptoms despite no other changes in his diet, medications or in his weight.  He was having symptoms on a daily basis and frequent nocturnal awakenings with acid in his mouth and throat.  A few weeks ago, his Protonix dose was increased to twice a day, and this seems to have helped, though he is still having frequent symptoms, just not as severe. ?He typically takes his Protonix around 7:30 AM, and then eats breakfast around 10.  He takes his evening Protonix around 7 PM and eats his dinner sometime tween 6 and 8 PM.  He does not smoke and does not drink alcohol.  He is a daily coffee drinker. ?He denies any problems with abdominal pain, nausea or vomiting, dysphagia.  He does have occasional hoarseness and a rare cough and throat irritation. ?He had an EGD many years ago when he started having the symptoms which she thinks was unremarkable. ?He had a colonoscopy a few years ago in [place] but does not member exactly when.  He does believe he had a history of polyps and was recommended to repeat in 5 years. ?He has no family history of GI malignancy.  He has regular bowel movements, no problems with diarrhea, constipation or blood in the stool. ? ? ?Past Medical History:  ?Diagnosis  Date  ? Arthritis   ? Enlarged prostate   ? Essential hypertension   ? GERD (gastroesophageal reflux disease)   ? Gout   ? diet controlled no recent flare ups  ? HIV infection ([place])   ? ? ? ?Past Surgical History:  ?Procedure Laterality Date  ? APPENDECTOMY  2005  ? open  ? Right elbow surgery  dec3 2020  ? in [place]  ? TRANSURETHRAL RESECTION OF PROSTATE N/A 02/14/2020  ? Procedure: TRANSURETHRAL RESECTION OF THE PROSTATE (TURP), BIPOLAR;  Surgeon: Janith Lima, MD;  Location: [facility];  Service: Urology;  Laterality: N/A;  ? ?Family History  ?Problem Relation Age of Onset  ? Alcohol abuse Mother   ? Hypertension Father   ? Stroke Father   ? ?Social History  ? ?Tobacco Use  ? Smoking status: Never  ? Smokeless tobacco: Never  ?Vaping Use  ? Vaping Use: Never used  ?Substance Use Topics  ? Alcohol use: Not Currently  ?  Alcohol/week: 0.0 standard drinks  ?  Comment: none since 2017  ? Drug use: Not Currently  ?  Types: Marijuana, "Crack" cocaine  ?  Comment:  cocaine and marijuana none since 2017  ? ?Current Outpatient Medications  ?Medication Sig Dispense Refill  ? atorvastatin (LIPITOR) 10 MG tablet Take 1 tablet (10 mg total) by mouth daily. [place]  tablet 11  ? betamethasone valerate ointment (VALISONE) 0.1 % Apply 1 application topically 2 (two) times daily. (Patient not taking: Reported on 12/04/2020) 30 g 0  ? dolutegravir (TIVICAY) 50 MG tablet Take 1 tablet (50 mg total) by mouth daily. 30 tablet 5  ? Elastic Bandages & Supports (THUMB BRACE) MISC Wear daily until symptom start to improve 1 each 0  ? emtricitabine-tenofovir AF (DESCOVY) 200-25 MG tablet Take 1 tablet by mouth daily. 30 tablet 5  ? fluticasone (FLONASE) 50 MCG/ACT nasal spray Place 1 spray into both nostrils daily. 11.1 g 3  ? olmesartan-hydrochlorothiazide (BENICAR HCT) 40-25 MG tablet Take 1 tablet by mouth daily. 90 tablet 3  ? pantoprazole (PROTONIX) 40 MG tablet Take 1 tablet (40 mg total) by mouth 2 (two) times daily.  60 tablet 2  ? ?No current facility-administered medications for this visit.  ? ?No Known Allergies ? ? ?Review of Systems: ?All systems reviewed and negative except where noted in HPI.  ? ? ?No results found. ? ?Physical Exam: ?BP (!) 132/100   Pulse 79   Ht '6\' 5"'$  (1.956 m)   Wt 260 lb (117.9 kg)   BMI 30.83 kg/m?  ?Constitutional: Pleasant,well-developed, African-American male in no acute distress. ?HEENT: Normocephalic and atraumatic. Conjunctivae are normal. No scleral icterus. ?Cardiovascular: Normal rate, regular rhythm.  ?Pulmonary/chest: Effort normal and breath sounds normal. No wheezing, rales or rhonchi. ?Abdominal: Soft, nondistended, nontender. Bowel sounds active throughout. There are no masses palpable. No hepatomegaly. ?Extremities: no edema ?Neurological: Alert and oriented to person place and time. ?Skin: Skin is warm and dry. No rashes noted. ?Psychiatric: Normal mood and affect. Behavior is normal. ? ?CBC ?   ?Component Value Date/Time  ? WBC 5.6 11/20/2020 0918  ? RBC 5.26 11/20/2020 0918  ? HGB 16.8 11/20/2020 0918  ? HCT 49.7 11/20/2020 0918  ? PLT 245 11/20/2020 0918  ? MCV 94.5 11/20/2020 0918  ? MCH 31.9 11/20/2020 0918  ? MCHC 33.8 11/20/2020 0918  ? RDW 13.1 11/20/2020 0918  ? LYMPHSABS 1,708 11/20/2020 0918  ? MONOABS 0.6 11/02/2014 1620  ? EOSABS 280 11/20/2020 0918  ? BASOSABS 78 11/20/2020 0918  ? ? ?CMP  ?   ?Component Value Date/Time  ? NA 141 03/13/2021 0933  ? NA 142 12/06/2019 1431  ? K 4.1 03/13/2021 0933  ? CL 103 03/13/2021 0933  ? CO2 29 03/13/2021 0933  ? GLUCOSE 88 03/13/2021 0933  ? BUN 14 03/13/2021 0933  ? BUN 13 12/06/2019 1431  ? CREATININE 1.44 (H) 03/13/2021 0933  ? CALCIUM 10.1 03/13/2021 0933  ? PROT 8.0 03/13/2021 0933  ? ALBUMIN 4.1 06/28/2019 0929  ? AST 24 03/13/2021 0933  ? ALT 19 03/13/2021 0933  ? ALKPHOS 92 06/28/2019 0929  ? BILITOT 0.7 03/13/2021 0933  ? GFRNONAA 62 07/10/2020 1037  ? GFRAA 72 07/10/2020 1037  ? ? ? ?ASSESSMENT AND PLAN: ?61 year old  male with 8+ year history of GERD symptoms, previously well controlled, but now persistent despite twice daily Protonix.  He does not smoke or drink, but does drink coffee daily.  His weight has been stable.  No dysphagia. ?We discussed the pathophysiology of GERD and the principles of GERD management to include lifestyle modifications  such as dietary discretion (avoidance of alcohol, tobacco, caffeinated and carbonated beverages, spicy/greasy foods, citrus, peppermint/chocolate), weight loss if applicable, head of bed elevation andconsuming last meal of day within 3 hours of bedtime; pharmacologic options to include PPIs, H2RAs and OTC antacids; and  finally surgical or endoscopic fundoplication. ?We discussed optimization of timing of his PPIs.  We also discussed potentially switching to another PPI.  I think an EGD is reasonable given his PPI refractory symptoms, and the fact he has had symptoms over 5 years. ?The patient will try to locate his previous colonoscopy report in [place] and provide that information to [place] we can make accurate recommendations for when he should receive his next screening/surveillance colonoscopy. ? ?GERD ?- EGD ?- Optimize Protonix dosing to 30-45 min before meals ?- Consider switching PPI after EGD if still having bothersome symptoms ? ?Colon cancer screening ?- Patient will locate previous colonoscopy report and send to [place]. ? ?The details, risks (including bleeding, perforation, infection, missed lesions, medication reactions and possible hospitalization or surgery if complications occur), benefits, and alternatives to EGD with possible biopsy and possible dilation were discussed with the patient and he consents to proceed.  ? ?Jasai Sorg E. Candis Schatz, MD ?[facility] ? ?CC:  Madalyn Rob, MD ? ?

## 2021-04-11 NOTE — Patient Instructions (Signed)
If you are age 63 or older, your body mass index should be between 23-30. Your Body mass index is 30.83 kg/m?[name] If this is out of the aforementioned range listed, please consider follow up with your Primary Care Provider. ? ?If you are age 31 or younger, your body mass index should be between 19-25. Your Body mass index is 30.83 kg/m?[name] If this is out of the aformentioned range listed, please consider follow up with your Primary Care Provider.  ? ?You have been scheduled for an endoscopy. Please follow written instructions given to you at your visit today. ?If you use inhalers (even only as needed), please bring them with you on the day of your procedure. ? ?The [facility] providers would like to encourage you to use [facility] to communicate with providers for non-urgent requests or questions.  Due to long hold times on the telephone, sending your provider a message by [facility] may be a faster and more efficient way to get a response.  Please allow 48 business hours for a response.  Please remember that this is for non-urgent requests.  ? ?It was a pleasure to see you today! ? ?Thank you for trusting me with your gastrointestinal care!   ? ?Scott E.Candis Schatz, MD  ? ?

## 2021-04-24 ENCOUNTER — Encounter: Payer: Self-pay | Admitting: Family

## 2021-05-02 ENCOUNTER — Encounter: Payer: Self-pay | Admitting: Internal Medicine

## 2021-05-02 ENCOUNTER — Ambulatory Visit (INDEPENDENT_AMBULATORY_CARE_PROVIDER_SITE_OTHER): Payer: Medicaid Other | Admitting: Internal Medicine

## 2021-05-02 ENCOUNTER — Ambulatory Visit (HOSPITAL_COMMUNITY)
Admission: RE | Admit: 2021-05-02 | Discharge: 2021-05-02 | Disposition: A | Discharge status: Home / Self Care | Payer: Medicaid Other | Source: Ambulatory Visit | Attending: Internal Medicine | Admitting: Internal Medicine

## 2021-05-02 ENCOUNTER — Ambulatory Visit: Payer: Medicaid Other | Admitting: Family

## 2021-05-02 ENCOUNTER — Encounter: Payer: Self-pay | Admitting: Family

## 2021-05-02 ENCOUNTER — Other Ambulatory Visit: Payer: Self-pay

## 2021-05-02 VITALS — BP 134/87 | HR 87 | Temp 97.8°F | Ht 77.0 in | Wt 258.8 lb

## 2021-05-02 VITALS — BP 131/87 | HR 77 | Temp 97.7°F | Ht 77.0 in | Wt 257.0 lb

## 2021-05-02 DIAGNOSIS — R7303 Prediabetes: Secondary | ICD-10-CM

## 2021-05-02 DIAGNOSIS — M546 Pain in thoracic spine: Secondary | ICD-10-CM | POA: Diagnosis not present

## 2021-05-02 DIAGNOSIS — Z0000 Encounter for general adult medical examination without abnormal findings: Secondary | ICD-10-CM

## 2021-05-02 DIAGNOSIS — B20 Human immunodeficiency virus [HIV] disease: Secondary | ICD-10-CM

## 2021-05-02 MED ORDER — KETOROLAC TROMETHAMINE 30 MG/ML IJ SOLN
15.0000 mg | Freq: Once | INTRAMUSCULAR | Status: AC
  Administered 2021-05-02: 15 mg via INTRAMUSCULAR

## 2021-05-02 MED ORDER — EMTRICITABINE-TENOFOVIR AF 200-25 MG PO TABS: 1.0000 | ORAL_TABLET | Freq: Every day | ORAL | 5 refills | Status: DC

## 2021-05-02 MED ORDER — TIVICAY 50 MG PO TABS: 50.0000 mg | ORAL_TABLET | Freq: Every day | ORAL | 5 refills | Status: DC

## 2021-05-02 MED ORDER — METHOCARBAMOL 750 MG PO TABS: ORAL_TABLET | ORAL | 0 refills | Status: AC

## 2021-05-02 NOTE — Progress Notes (Signed)
? ?  CC: thoracic back pain ? ?HPI:Mr.MARCELLAS MARCHANT is a 61 y.o. male who presents for evaluation of thoracic back pain. Please see individual problem based A/P for details. ? ?Past Medical History:  ?Diagnosis Date  ? Arthritis   ? Enlarged prostate   ? Essential hypertension   ? GERD (gastroesophageal reflux disease)   ? Gout   ? diet controlled no recent flare ups  ? HIV infection ([place])   ? ?Review of Systems:   ?Review of Systems  ?Constitutional:  Negative for chills, fever and weight loss.  ?Respiratory:  Negative for shortness of breath.   ?Cardiovascular:  Negative for chest pain.  ?Neurological:  Negative for sensory change and focal weakness.   ? ?Physical Exam: ?Vitals:  ? 05/02/21 1317  ?BP: 134/87  ?Pulse: 87  ?Temp: 97.8 ?F (36.6 ?C)  ?TempSrc: Oral  ?SpO2: 98%  ?Weight: 258 lb 12.8 oz (117.4 kg)  ?Height: '6\' 5"'$  (1.956 m)  ? ?General: NAD.well kept ?Cardiovascular: Normal rate, regular rhythm.  No murmurs, rubs, or gallops ?Pulmonary : Equal breath sounds, No wheezes, rales, or rhonchi ?No gross deformity, scoliosis. ?No midline or bony TTP. ?FROM. ?Strength LEs 5/5 all muscle groups.   ?2+ MSRs in patellar and achilles tendons, equal bilaterally. ?Negative SLRs. ?Sensation intact to light touch bilaterally. ? ? ?Assessment & Plan:  ? ?See Encounters Tab for problem based charting. ? ?Patient discussed with Dr. Evette Doffing ? ?

## 2021-05-02 NOTE — Assessment & Plan Note (Signed)
Mr. Dinovo continues to have well-controlled virus with good adherence and tolerance to his ART regimen of Tivicay and Descovy.  No signs/symptoms of opportunistic infection.  We reviewed lab work and discussed plan of care.  Continue current dose of Tivicay and Descovy.  Plan for follow-up in 6 months or sooner if needed with lab work 1 to 2 weeks prior to appointment. ?

## 2021-05-02 NOTE — Progress Notes (Signed)
? ? ?Brief Narrative  ? ?Patient ID: Chad Little, male    DOB: 11/07/1960, 61 y.o.   MRN: [id number] ? ?[name] is a 61 year old African-American male diagnosed with HIV-1 in 1998 with risk factor heterosexual contact.  Initial viral load and CD4 count unavailable.  Genotype from 2009 with no significant medication resistance patterns.  No history of opportunistic infection. [id number] negative. ART regimen of Tivicay and Descovy.  ? ?Subjective:  ?  ?Chief Complaint  ?Patient presents with  ? Follow-up  ?  Declined condoms   ? ? ?HPI: ? ?ESTEVON Little is a 61 y.o. male with HIV disease last seen on 12/04/20 with well controlled virus and good adherence and tolerance to his ART regimen of Tivicay and Descovy. Viral load was undetectable and CD4 count 581. Most recent lab work completed on 03/13/21 with viral load that remains undetectable and CD4 count 823. Kidney function, liver function, and electrolytes within normal ranges. Here today for routine follow up. ? ?[name] continues to take his Tivicay and Descovy daily as prescribed with no adverse side effects.  Overall feeling well today with no new concerns/complaints. Denies fevers, chills, night sweats, headaches, changes in vision, neck pain/stiffness, nausea, diarrhea, vomiting, lesions or rashes. ? ?[name] has no problems obtaining medication from the pharmacy.  Denies feelings of being down, depressed, or hopeless recently.  No current recreational or illicit drug use, tobacco use, or alcohol consumption.  Condoms offered.  All vaccinations up-to-date per recommendations. ? ? ?No Known Allergies ? ? ? ?Outpatient Medications Prior to Visit  ?Medication Sig Dispense Refill  ? atorvastatin (LIPITOR) 10 MG tablet Take 1 tablet (10 mg total) by mouth daily. 30 tablet 11  ? fluticasone (FLONASE) 50 MCG/ACT nasal spray Place 1 spray into both nostrils daily. 11.1 g 3  ? methocarbamol (ROBAXIN) 750 MG tablet Take 2 tablets (1,500 mg total) by  mouth 3 (three) times daily for 3 days, THEN 1 tablet (750 mg total) 3 (three) times daily for 3 days. 27 tablet 0  ? olmesartan-hydrochlorothiazide (BENICAR HCT) 40-25 MG tablet Take 1 tablet by mouth daily. 90 tablet 3  ? pantoprazole (PROTONIX) 40 MG tablet Take 1 tablet (40 mg total) by mouth 2 (two) times daily. 60 tablet 2  ? dolutegravir (TIVICAY) 50 MG tablet Take 1 tablet (50 mg total) by mouth daily. 30 tablet 5  ? emtricitabine-tenofovir AF (DESCOVY) 200-25 MG tablet Take 1 tablet by mouth daily. 30 tablet 5  ? ?No facility-administered medications prior to visit.  ? ? ? ?Past Medical History:  ?Diagnosis Date  ? Arthritis   ? Enlarged prostate   ? Essential hypertension   ? GERD (gastroesophageal reflux disease)   ? Gout   ? diet controlled no recent flare ups  ? HIV infection ([place])   ? ? ? ?Past Surgical History:  ?Procedure Laterality Date  ? APPENDECTOMY  2005  ? open  ? Right elbow surgery  dec3 2020  ? in [place]  ? TRANSURETHRAL RESECTION OF PROSTATE N/A 02/14/2020  ? Procedure: TRANSURETHRAL RESECTION OF THE PROSTATE (TURP), BIPOLAR;  Surgeon: Janith Lima, MD;  Location: [facility];  Service: Urology;  Laterality: N/A;  ? ? ? ? ?Review of Systems  ?Constitutional:  Negative for appetite change, chills, fatigue, fever and unexpected weight change.  ?Eyes:  Negative for visual disturbance.  ?Respiratory:  Negative for cough, chest tightness, shortness of breath and wheezing.   ?Cardiovascular:  Negative for chest  pain and leg swelling.  ?Gastrointestinal:  Negative for abdominal pain, constipation, diarrhea, nausea and vomiting.  ?Genitourinary:  Negative for dysuria, flank pain, frequency, genital sores, hematuria and urgency.  ?Skin:  Negative for rash.  ?Allergic/Immunologic: Negative for immunocompromised state.  ?Neurological:  Negative for dizziness and headaches.  ?   ?Objective:  ?  ?BP 131/87   Pulse 77   Temp 97.7 ?F (36.5 ?C) (Oral)   Ht '6\' 5"'$  (1.956 m)   Wt 257  lb (116.6 kg)   SpO2 94%   BMI 30.48 kg/m?  ?Nursing note and vital signs reviewed. ? ?Physical Exam ?Constitutional:   ?   General: He is not in acute distress. ?   Appearance: He is well-developed.  ?Eyes:  ?   Conjunctiva/sclera: Conjunctivae normal.  ?Cardiovascular:  ?   Rate and Rhythm: Normal rate and regular rhythm.  ?   Heart sounds: Normal heart sounds. No murmur heard. ?  No friction rub. No gallop.  ?Pulmonary:  ?   Effort: Pulmonary effort is normal. No respiratory distress.  ?   Breath sounds: Normal breath sounds. No wheezing or rales.  ?Chest:  ?   Chest wall: No tenderness.  ?Abdominal:  ?   General: Bowel sounds are normal.  ?   Palpations: Abdomen is soft.  ?   Tenderness: There is no abdominal tenderness.  ?Musculoskeletal:  ?   Cervical back: Neck supple.  ?Lymphadenopathy:  ?   Cervical: No cervical adenopathy.  ?Skin: ?   General: Skin is warm and dry.  ?   Findings: No rash.  ?Neurological:  ?   Mental Status: He is alert and oriented to person, place, and time.  ?Psychiatric:     ?   Behavior: Behavior normal.     ?   Thought Content: Thought content normal.     ?   Judgment: Judgment normal.  ? ? ? ? ?  05/02/2021  ?  4:00 PM 03/14/2021  ?  9:43 AM 12/04/2020  ?  9:28 AM 11/28/2020  ?  4:03 PM 10/23/2020  ? 10:14 AM  ?Depression screen PHQ 2/9  ?Decreased Interest 0 0 0 0 0  ?Down, Depressed, Hopeless 0 0 0 0 0  ?PHQ - 2 Score 0 0 0 0 0  ?Altered sleeping     0  ?Tired, decreased energy     0  ?Change in appetite     0  ?Feeling bad or failure about yourself      0  ?Trouble concentrating     0  ?Moving slowly or fidgety/restless     0  ?Suicidal thoughts     0  ?PHQ-9 Score     0  ?Difficult doing work/chores    Not difficult at all Not difficult at all  ?  ?   ?Assessment & Plan:  ? ? ?Patient Active Problem List  ? Diagnosis Date Noted  ? Thumb pain, left 01/15/2021  ? Sprain of tibiofibular ligament of left ankle 01/15/2021  ? BPPV (benign paroxysmal positional vertigo), bilateral  10/23/2020  ? Superficial vein thrombosis 08/08/2020  ? Seasonal allergies 07/06/2020  ? GERD (gastroesophageal reflux disease) 06/13/2020  ? S/P TURP 03/22/2020  ? Healthcare maintenance 05/25/2019  ? Prediabetes 04/14/2019  ? Hypertension 10/31/2014  ? HIV disease ([place]) 12/28/2007  ? Osteoarthritis 12/28/2007  ? ? ? ?Problem List Items Addressed This Visit   ? ?  ? Other  ? HIV disease ([place])  ?  Mr. Goracke  continues to have well-controlled virus with good adherence and tolerance to his ART regimen of Tivicay and Descovy.  No signs/symptoms of opportunistic infection.  We reviewed lab work and discussed plan of care.  Continue current dose of Tivicay and Descovy.  Plan for follow-up in 6 months or sooner if needed with lab work 1 to 2 weeks prior to appointment. ?  ?  ? Relevant Medications  ? emtricitabine-tenofovir AF (DESCOVY) 200-25 MG tablet  ? dolutegravir (TIVICAY) 50 MG tablet  ? Other Relevant Orders  ? HIV-1 RNA quant-no reflex-bld  ? T-helper cell (CD4)- ([facility] clinic only)  ? Comprehensive metabolic panel  ? Prediabetes - Primary  ?  Mr. Reierson was previously noted to have prediabetes with hemoglobin A1c of 5.8.  Recheck hemoglobin A1c with next lab work.  He will continue to work on lifestyle management.  No medication is indicated at this time. ?  ?  ? Relevant Orders  ? HgB A1c  ? Healthcare maintenance  ?  Discussed importance of safe sexual practices and condom use.  Condoms offered. ?Routine vaccinations up-to-date per recommendations. ?Will complete routine dental care and can refer to [facility] if needed.  ?  ?  ? ? ? ?I am having [name] maintain his olmesartan-hydrochlorothiazide, atorvastatin, fluticasone, pantoprazole, methocarbamol, emtricitabine-tenofovir AF, and Tivicay. ? ? ?Meds ordered this encounter  ?Medications  ? emtricitabine-tenofovir AF (DESCOVY) 200-25 MG tablet  ?  Sig: Take 1 tablet by mouth daily.  ?  Dispense:  30 tablet  ?  Refill:  5  ?  Order Specific Question:    Supervising Provider  ?  Answer:   Carlyle Basques [[id number]]  ? dolutegravir (TIVICAY) 50 MG tablet  ?  Sig: Take 1 tablet (50 mg total) by mouth daily.  ?  Dispense:  30 tablet  ?  Refill:  5  ?  Order Specific Milda Smart

## 2021-05-02 NOTE — Patient Instructions (Addendum)
Nice to see you.  We will check your lab work today.  Continue to take your medication daily as prescribed.  Refills have been sent to the pharmacy.  Plan for follow up in 6 months or sooner if needed with lab work 1-2 weeks prior to appointment.   Have a great day and stay safe!  

## 2021-05-02 NOTE — Assessment & Plan Note (Signed)
?   Discussed importance of safe sexual practices and condom use.  Condoms offered. ?? Routine vaccinations up-to-date per recommendations. ?? Will complete routine dental care and can refer to [facility] if needed.  ?

## 2021-05-02 NOTE — Assessment & Plan Note (Signed)
Mr. Martucci was previously noted to have prediabetes with hemoglobin A1c of 5.8.  Recheck hemoglobin A1c with next lab work.  He will continue to work on lifestyle management.  No medication is indicated at this time. ?

## 2021-05-02 NOTE — Assessment & Plan Note (Signed)
Patient presents with a 4 week history of acute onset throbbing back pain located in right thoracic spine. Two weeks later he noticed some radiation of the pain to the right side. He states that pain started after a twisting movement while lifting a composite interior door and has no other associated symptoms. The pain is aggravated by physical activity. He has tried tylenol and Voltaren cream with not much relief. The patient displays no "red-flag symptoms":  ? ?Assessment/Plan: Acute right-sided thoracic back pain. Description of acute onset with lifting , will send for xray of thoracic spine to evaluate for compression fracture. If not compression fracture will treat for paraspinal muscle strain. Will avoid course of NSAIDS as patient has EGD planned on Monday for refractory GERD.  ?- ketorolac (TORADOL) 30 MG/ML injection 15 mg , clinic administered ?- DG Thoracic Spine 2 View ?- methocarbamol (ROBAXIN) 750 MG tablet; Take 2 tablets (1,500 mg total) by mouth 3 (three) times daily for 3 days, THEN 1 tablet (750 mg total) 3 (three) times daily for 3 days.  Dispense: 27 tablet; Refill: 0 ?

## 2021-05-03 ENCOUNTER — Other Ambulatory Visit: Payer: Self-pay | Admitting: Internal Medicine

## 2021-05-03 ENCOUNTER — Encounter: Payer: Self-pay | Admitting: Internal Medicine

## 2021-05-03 DIAGNOSIS — B351 Tinea unguium: Secondary | ICD-10-CM

## 2021-05-03 NOTE — Progress Notes (Signed)
Returned results of xray. No compression fracture. Back pain improving with treatment prescribed.  ? ?Patient requested referral for Onychomycosis ?- referral to podiatry  ? ?

## 2021-05-03 NOTE — Progress Notes (Signed)
Internal Medicine Clinic Attending  Case discussed with Dr. Steen  At the time of the visit.  We reviewed the resident's history and exam and pertinent patient test results.  I agree with the assessment, diagnosis, and plan of care documented in the resident's note.  

## 2021-05-05 DIAGNOSIS — Z419 Encounter for procedure for purposes other than remedying health state, unspecified: Secondary | ICD-10-CM | POA: Diagnosis not present

## 2021-06-04 DIAGNOSIS — Z419 Encounter for procedure for purposes other than remedying health state, unspecified: Secondary | ICD-10-CM | POA: Diagnosis not present

## 2021-06-06 ENCOUNTER — Encounter: Payer: Medicaid Other | Admitting: Gastroenterology

## 2021-06-06 ENCOUNTER — Ambulatory Visit (AMBULATORY_SURGERY_CENTER): Payer: Medicaid Other | Admitting: Gastroenterology

## 2021-06-06 ENCOUNTER — Encounter: Payer: Self-pay | Admitting: Gastroenterology

## 2021-06-06 VITALS — BP 117/86 | HR 80 | Temp 98.7°F | Resp 16 | Ht 77.0 in | Wt 257.0 lb

## 2021-06-06 DIAGNOSIS — K449 Diaphragmatic hernia without obstruction or gangrene: Secondary | ICD-10-CM

## 2021-06-06 DIAGNOSIS — D131 Benign neoplasm of stomach: Secondary | ICD-10-CM

## 2021-06-06 DIAGNOSIS — Z1211 Encounter for screening for malignant neoplasm of colon: Secondary | ICD-10-CM

## 2021-06-06 DIAGNOSIS — K2970 Gastritis, unspecified, without bleeding: Secondary | ICD-10-CM | POA: Diagnosis not present

## 2021-06-06 DIAGNOSIS — K219 Gastro-esophageal reflux disease without esophagitis: Secondary | ICD-10-CM | POA: Diagnosis not present

## 2021-06-06 DIAGNOSIS — K317 Polyp of stomach and duodenum: Secondary | ICD-10-CM | POA: Diagnosis not present

## 2021-06-06 DIAGNOSIS — I10 Essential (primary) hypertension: Secondary | ICD-10-CM | POA: Diagnosis not present

## 2021-06-06 MED ORDER — SODIUM CHLORIDE 0.9 % IV SOLN: 500.0000 mL | Freq: Once | INTRAVENOUS | Status: DC

## 2021-06-06 NOTE — Progress Notes (Signed)
Report to PACU, RN, vss, BBS= Clear.  

## 2021-06-06 NOTE — Progress Notes (Signed)
Called to room to assist during endoscopic procedure.  Patient ID and intended procedure confirmed with present staff. Received instructions for my participation in the procedure from the performing physician.  

## 2021-06-06 NOTE — Progress Notes (Signed)
[facility] History and Physical ? ? ?Primary Care Physician:  Madalyn Rob, MD ? ? ?Reason for Procedure:   GERD ? ?Plan:    EGD ? ? ? ? ?HPI: Chad Little is a 61 y.o. male undergoing EGD to evaluate persistent GERD symptoms despite PPI use.  No dysphagia or weight loss. ? ?Past Medical History:  ?Diagnosis Date  ? Arthritis   ? Enlarged prostate   ? Essential hypertension   ? GERD (gastroesophageal reflux disease)   ? Gout   ? diet controlled no recent flare ups  ? HIV infection ([place])   ? ? ?Past Surgical History:  ?Procedure Laterality Date  ? APPENDECTOMY  2005  ? open  ? Right elbow surgery  dec3 2020  ? in [place]  ? TRANSURETHRAL RESECTION OF PROSTATE N/A 02/14/2020  ? Procedure: TRANSURETHRAL RESECTION OF THE PROSTATE (TURP), BIPOLAR;  Surgeon: Janith Lima, MD;  Location: [facility];  Service: Urology;  Laterality: N/A;  ? ? ?Prior to Admission medications   ?Medication Sig Start Date End Date Taking? Authorizing Provider  ?atorvastatin (LIPITOR) 10 MG tablet Take 1 tablet (10 mg total) by mouth daily. 10/19/20 10/19/21 Yes Madalyn Rob, MD  ?dolutegravir (TIVICAY) 50 MG tablet Take 1 tablet (50 mg total) by mouth daily. 05/02/21  Yes Golden Circle, FNP  ?emtricitabine-tenofovir AF (DESCOVY) 200-25 MG tablet Take 1 tablet by mouth daily. 05/02/21  Yes Golden Circle, FNP  ?olmesartan-hydrochlorothiazide (BENICAR HCT) 40-25 MG tablet Take 1 tablet by mouth daily. 06/15/20  Yes Mosetta Anis, MD  ?fluticasone (FLONASE) 50 MCG/ACT nasal spray Place 1 spray into both nostrils daily. 02/15/21   Madalyn Rob, MD  ?pantoprazole (PROTONIX) 40 MG tablet Take 1 tablet (40 mg total) by mouth 2 (two) times daily. 03/14/21 05/02/21  Scarlett Presto, MD  ? ? ?Current Outpatient Medications  ?Medication Sig Dispense Refill  ? atorvastatin (LIPITOR) 10 MG tablet Take 1 tablet (10 mg total) by mouth daily. 30 tablet 11  ? dolutegravir (TIVICAY) 50 MG tablet Take 1 tablet (50 mg total)  by mouth daily. 30 tablet 5  ? emtricitabine-tenofovir AF (DESCOVY) 200-25 MG tablet Take 1 tablet by mouth daily. 30 tablet 5  ? olmesartan-hydrochlorothiazide (BENICAR HCT) 40-25 MG tablet Take 1 tablet by mouth daily. 90 tablet 3  ? fluticasone (FLONASE) 50 MCG/ACT nasal spray Place 1 spray into both nostrils daily. 11.1 g 3  ? pantoprazole (PROTONIX) 40 MG tablet Take 1 tablet (40 mg total) by mouth 2 (two) times daily. 60 tablet 2  ? ?Current Facility-Administered Medications  ?Medication Dose Route Frequency Provider Last Rate Last Admin  ? 0.9 %  sodium chloride infusion  500 mL Intravenous Once Daryel November, MD      ? ? ?Allergies as of 06/06/2021  ? (No Known Allergies)  ? ? ?Family History  ?Problem Relation Age of Onset  ? Alcohol abuse Mother   ? Hypertension Father   ? Stroke Father   ? ? ?Social History  ? ?Socioeconomic History  ? Marital status: Single  ?  Spouse name: Not on file  ? Number of children: Not on file  ? Years of education: Not on file  ? Highest education level: Not on file  ?Occupational History  ? Not on file  ?Tobacco Use  ? Smoking status: Never  ? Smokeless tobacco: Never  ?Vaping Use  ? Vaping Use: Never used  ?Substance and Sexual Activity  ? Alcohol use: Not Currently  ?  Alcohol/week: 0.0 standard drinks  ?  Comment: none since 2017  ? Drug use: Not Currently  ?  Types: Marijuana, "Crack" cocaine  ?  Comment:  cocaine and marijuana none since 2017  ? Sexual activity: Not Currently  ?  Comment: refused  condoms 11/09/2019  ?Other Topics Concern  ? Not on file  ?Social History Narrative  ? Not on file  ? ?Social Determinants of Health  ? ?Financial Resource Strain: Not on file  ?Food Insecurity: Not on file  ?Transportation Needs: Not on file  ?Physical Activity: Not on file  ?Stress: Not on file  ?Social Connections: Not on file  ?Intimate Partner Violence: Not on file  ? ? ?Review of Systems: ? ?All other review of systems negative except as mentioned in the  HPI. ? ?Physical Exam: ?Vital signs ?BP (!) 117/91   Pulse 75   Temp 98.7 ?F (37.1 ?C) (Skin)   Resp 10   Ht '6\' 5"'$  (1.956 m)   Wt 257 lb (116.6 kg)   SpO2 97%   BMI 30.48 kg/m?  ? ?General:   Alert,  Well-developed, well-nourished, pleasant and cooperative in NAD ?Airway:  Mallampati 1 ?Lungs:  Clear throughout to auscultation.   ?Heart:  Regular rate and rhythm; no murmurs, clicks, rubs,  or gallops. ?Abdomen:  Soft, nontender and nondistended. Normal bowel sounds.   ?Neuro/Psych:  Normal mood and affect. A and O x 3 ? ? ?Coltyn Hanning E. Candis Schatz, MD ?[facility] ? ?

## 2021-06-06 NOTE — Progress Notes (Signed)
Pt's states no medical or surgical changes since previsit or office visit. VS assessed by C.W 

## 2021-06-06 NOTE — Op Note (Signed)
[place] ?Patient Name: Chad Little ?Procedure Date: 06/06/2021 9:08 AM ?MRN: [id number] ?Endoscopist: Tayleigh Wetherell E. Candis Schatz , MD ?Age: 61 ?Referring MD:  ?Date of Birth: 08/14/1960 ?Gender: Male ?Account #: [id number] ?Procedure:                Upper GI endoscopy ?Indications:              Esophageal reflux symptoms that persist despite  ?                          appropriate therapy ?Medicines:                Monitored Anesthesia Care ?Procedure:                Pre-Anesthesia Assessment: ?                          - Prior to the procedure, a History and Physical  ?                          was performed, and patient medications and  ?                          allergies were reviewed. The patient's tolerance of  ?                          previous anesthesia was also reviewed. The risks  ?                          and benefits of the procedure and the sedation  ?                          options and risks were discussed with the patient.  ?                          All questions were answered, and informed consent  ?                          was obtained. Prior Anticoagulants: The patient has  ?                          taken no previous anticoagulant or antiplatelet  ?                          agents. ASA Grade Assessment: III - A patient with  ?                          severe systemic disease. After reviewing the risks  ?                          and benefits, the patient was deemed in  ?                          satisfactory condition to undergo the procedure. ?  After obtaining informed consent, the endoscope was  ?                          passed under direct vision. Throughout the  ?                          procedure, the patient's blood pressure, pulse, and  ?                          oxygen saturations were monitored continuously. The  ?                          GIF [id number] #[id number] was introduced through the  ?                          mouth, and advanced to the  third part of duodenum.  ?                          The upper GI endoscopy was accomplished without  ?                          difficulty. The patient tolerated the procedure  ?                          well. ?Scope In: ?Scope Out: ?Findings:                 The examined portions of the nasopharynx,  ?                          oropharynx and larynx were normal. ?                          The examined esophagus was normal. ?                          A 2 cm hiatal hernia was present. ?                          A single 4 mm sessile polyp was found in the  ?                          gastric body. The polyp was removed with a cold  ?                          biopsy forceps. Resection and retrieval were  ?                          complete. Estimated blood loss was minimal. ?                          The exam of the stomach was otherwise normal. ?                          The examined duodenum was normal. ?Complications:  No immediate complications. ?Estimated Blood Loss:     Estimated blood loss was minimal. ?Impression:               - The examined portions of the nasopharynx,  ?                          oropharynx and larynx were normal. ?                          - Normal esophagus. No esophagitis or Barrett's  ?                          esophagus ?                          - 2 cm hiatal hernia. ?                          - A single gastric polyp. Resected and retrieved.  ?                          This was consistent with a fundic gland polyp ?                          - Normal examined duodenum. ?Recommendation:           - Patient has a contact number available for  ?                          emergencies. The signs and symptoms of potential  ?                          delayed complications were discussed with the  ?                          patient. Return to normal activities tomorrow.  ?                          Written discharge instructions were provided to the  ?                          patient. ?                           - Resume previous diet. ?                          - Continue present medications (Protonix BID) and  ?                          GERD lifestyle modifications. ?                          - Await pathology results. ?Zaidan Keeble E. Candis Schatz, MD ?06/06/2021 9:34:48 AM ?This report has been signed electronically. ?

## 2021-06-06 NOTE — Patient Instructions (Signed)
Discharge instructions given. ?Handout on Hiatal Hernia. ?Resume previous medications. ?YOU HAD AN ENDOSCOPIC PROCEDURE TODAY AT [facility]:   Refer to the procedure report that was given to you for any specific questions about what was found during the examination.  If the procedure report does not answer your questions, please call your gastroenterologist to clarify.  If you requested that your care partner not be given the details of your procedure findings, then the procedure report has been included in a sealed envelope for you to review at your convenience later. ? ?YOU SHOULD EXPECT: Some feelings of bloating in the abdomen. Passage of more gas than usual.  Walking can help get rid of the air that was put into your GI tract during the procedure and reduce the bloating. If you had a lower endoscopy (such as a colonoscopy or flexible sigmoidoscopy) you may notice spotting of blood in your stool or on the toilet paper. If you underwent a bowel prep for your procedure, you may not have a normal bowel movement for a few days. ? ?Please Note:  You might notice some irritation and congestion in your nose or some drainage.  This is from the oxygen used during your procedure.  There is no need for concern and it should clear up in a day or so. ? ?SYMPTOMS TO REPORT IMMEDIATELY: ? ? ?Following upper endoscopy (EGD) ? Vomiting of blood or coffee ground material ? New chest pain or pain under the shoulder blades ? Painful or persistently difficult swallowing ? New shortness of breath ? Fever of 100?F or higher ? Black, tarry-looking stools ? ?For urgent or emergent issues, a gastroenterologist can be reached at any hour by calling [phone]. ?Do not use MyChart messaging for urgent concerns.  ? ? ?DIET:  We do recommend a small meal at first, but then you may proceed to your regular diet.  Drink plenty of fluids but you should avoid alcoholic beverages for 24 hours. ? ?ACTIVITY:  You should plan to  take it easy for the rest of today and you should NOT DRIVE or use heavy machinery until tomorrow (because of the sedation medicines used during the test).   ? ?FOLLOW UP: ?Our staff will call the number listed on your records 48-72 hours following your procedure to check on you and address any questions or concerns that you may have regarding the information given to you following your procedure. If we do not reach you, we will leave a message.  We will attempt to reach you two times.  During this call, we will ask if you have developed any symptoms of COVID 19. If you develop any symptoms (ie: fever, flu-like symptoms, shortness of breath, cough etc.) before then, please call [phone].  If you test positive for Covid 19 in the 2 weeks post procedure, please call and report this information to [place].   ? ?If any biopsies were taken you will be contacted by phone or by letter within the next 1-3 weeks.  Please call us at [phone] if you have not heard about the biopsies in 3 weeks.  ? ? ?SIGNATURES/CONFIDENTIALITY: ?You and/or your care partner have signed paperwork which will be entered into your electronic medical record.  These signatures attest to the fact that that the information above on your After Visit Summary has been reviewed and is understood.  Full responsibility of the confidentiality of this discharge information lies with you and/or your care-partner.  ?

## 2021-06-08 ENCOUNTER — Ambulatory Visit: Payer: Medicaid Other | Admitting: Podiatry

## 2021-06-08 ENCOUNTER — Telehealth: Payer: Self-pay | Admitting: *Deleted

## 2021-06-08 ENCOUNTER — Encounter: Payer: Self-pay | Admitting: Podiatry

## 2021-06-08 ENCOUNTER — Other Ambulatory Visit: Payer: Self-pay

## 2021-06-08 DIAGNOSIS — J302 Other seasonal allergic rhinitis: Secondary | ICD-10-CM

## 2021-06-08 DIAGNOSIS — L603 Nail dystrophy: Secondary | ICD-10-CM

## 2021-06-08 DIAGNOSIS — I10 Essential (primary) hypertension: Secondary | ICD-10-CM

## 2021-06-08 NOTE — Progress Notes (Signed)
This patient presents to the office stating he has pain in both big toes.  He says he has surgery for the permanent nail removal right big toe. Patient had permanent big toenail removal  in 2021.  He says the great toes has developed a crust as well as nail at surgical sites.   He says the areas on both big toes are hard and painful.  He says he soaks his feet but the hard crust returns.  He presents for an evaluation and treatment. ? ?Vascular  Dorsalis pedis and posterior tibial pulses are palpable  B/L.  Capillary return  WNL.  Temperature gradient is  WNL.  Skin turgor  WNL ? ?Sensorium  Senn Weinstein monofilament wire  WNL. Normal tactile sensation. ? ?Nail Exam  Patient has normal nails with no evidence of bacterial or fungal infection. Hard crusty skin at site of nail bed both halluces. ? ?Orthopedic  Exam  Muscle tone and muscle strength  WNL.  No limitations of motion feet  B/L.  No crepitus or joint effusion noted.  Foot type is unremarkable and digits show no abnormalities.  Bony prominences are unremarkable. ? ?Skin  No open lesions.  Normal skin texture and turgor.  ? ?Nail Dystrophy Hallux  B/L. ? ?IE.  Discussed condition with patient.  Told him to use vaseline and pumice stone.    RTC prn ? ? ?Gardiner Barefoot DPM  ?

## 2021-06-08 NOTE — Telephone Encounter (Signed)
pantoprazole (PROTONIX) 40 MG tablet (Expired),  ? ?fluticasone (FLONASE) 50 MCG/ACT nasal spray ? ?olmesartan-hydrochlorothiazide (BENICAR HCT) 40-25 MG tablet ?refill request @ [facility], [place] AT [place]. ?

## 2021-06-08 NOTE — Telephone Encounter (Signed)
?  Follow up Call- ? ? ?  06/06/2021  ?  8:40 AM  ?Call back number  ?Post procedure Call Back phone  # [phone]  ?Permission to leave phone message Yes  ?  ? ?Patient questions: ? ?Do you have a fever, pain , or abdominal swelling? No. ?Pain Score  0 * ? ?Have you tolerated food without any problems? Yes.   ? ?Have you been able to return to your normal activities? Yes.   ? ?Do you have any questions about your discharge instructions: ?Diet   No. ?Medications  No. ?Follow up visit  No. ? ?Do you have questions or concerns about your Care? No. ? ?Actions: ?* If pain score is 4 or above: ?No action needed, pain <4. ? ? ?

## 2021-06-11 MED ORDER — FLUTICASONE PROPIONATE 50 MCG/ACT NA SUSP: 1.0000 | Freq: Every day | NASAL | 3 refills | Status: DC

## 2021-06-11 MED ORDER — PANTOPRAZOLE SODIUM 40 MG PO TBEC
40.0000 mg | DELAYED_RELEASE_TABLET | Freq: Every day | ORAL | 1 refills | Status: DC
Start: 2021-06-11 — End: 2021-08-20

## 2021-06-11 MED ORDER — OLMESARTAN MEDOXOMIL-HCTZ 40-25 MG PO TABS: 1.0000 | ORAL_TABLET | Freq: Every day | ORAL | 3 refills | Status: DC

## 2021-06-11 NOTE — Progress Notes (Signed)
Chad Little, ?The polyp resected from your stomach was a benign fundic gland polyp.  There was no evidence of infection with Helicobacter pylori or intestinal metaplasia/dysplasia.  These types of polyps are typically secondary to acid suppression therapy and no specific follow-up required for these small, benign polyps ?

## 2021-07-05 DIAGNOSIS — Z419 Encounter for procedure for purposes other than remedying health state, unspecified: Secondary | ICD-10-CM | POA: Diagnosis not present

## 2021-07-13 ENCOUNTER — Ambulatory Visit: Payer: Medicaid Other | Admitting: Family Medicine

## 2021-07-13 ENCOUNTER — Encounter: Payer: Self-pay | Admitting: Family Medicine

## 2021-07-13 VITALS — BP 138/88 | Ht 77.0 in | Wt 262.0 lb

## 2021-07-13 DIAGNOSIS — M1812 Unilateral primary osteoarthritis of first carpometacarpal joint, left hand: Secondary | ICD-10-CM | POA: Insufficient documentation

## 2021-07-13 DIAGNOSIS — M79645 Pain in left finger(s): Secondary | ICD-10-CM | POA: Diagnosis not present

## 2021-07-13 MED ORDER — METHYLPREDNISOLONE ACETATE 40 MG/ML IJ SUSP
20.0000 mg | Freq: Once | INTRAMUSCULAR | Status: AC
  Administered 2021-07-13: 20 mg via INTRA_ARTICULAR

## 2021-07-13 NOTE — Assessment & Plan Note (Addendum)
Known left first CMC arthritis, but pain more over MCP joint, which is prominent and has some degenerative changes there as well. Has done well with corticosteroid injection in the past.  We discussed that he can continue to use Voltaren gel or oral anti-inflammatories as needed if the pain recurs, but we can continue to repeat injections every 3 months if he continues to find this helpful.  He also has a brace for this.  Discussed the risk and benefits of proceeding with a corticosteroid injection to MCP he opts to proceed with this today.  We will have him follow-up as needed.  Procedure performed: First MCP joint corticosteroid injection; US guided  Consent obtained and verified. Time-out conducted. Noted no overlying erythema, induration, or other signs of local infection. The right first MCP was visualized with ultrasound. The over overlying skin was prepped prepped in a sterile fashion. Topical analgesic spray: None. Joint: Right first MCP Needle: 25-gauge 1.5 inch Completed without difficulty. Meds: DepoMedrol 20 mg, lidocaine 0.5 cc  Advised to call if fevers/chills, erythema, induration, drainage, or persistent bleeding.

## 2021-07-13 NOTE — Progress Notes (Unsigned)
   Chad Little is a 61 y.o. male who presents to [facility] today for the following:  Left thumb pain follow-up Last seen for the same in January 2023 He has received a CMC injection under ultrasound guidance as well as a trigger finger injection He has had prior x-rays that show CMC arthritis of the thumb Pain over the left MCP has been worsening over the last week States that he went to pick up a bottle and felt significant pain in this area He did use some Motrin and Voltaren that helped some, but the pain came back He is hopeful to receive another MCP injection today He denies any triggering  PMH reviewed.  ROS as above. Medications reviewed.  Exam:  BP 138/88   Ht '6\' 5"'$  (1.956 m)   Wt 262 lb (118.8 kg)   BMI 31.07 kg/m  Gen: Well NAD MSK:  Left Hand/Thumb: Inspection: Prominent 1st MCP joint on left No swelling, erythema or bruising b/l Palpation: no TTP b/l ROM: Full ROM of the thumb Neurovascular: NV intact b/l   No results found.   Assessment and Plan: 1) Thumb pain, left Known left first CMC arthritis, but pain more over MCP joint, which is prominent and has some degenerative changes there as well. Has done well with corticosteroid injection in the past.  We discussed that he can continue to use Voltaren gel or oral anti-inflammatories as needed if the pain recurs, but we can continue to repeat injections every 3 months if he continues to find this helpful.  He also has a brace for this.  Discussed the risk and benefits of proceeding with a corticosteroid injection to MCP he opts to proceed with this today.  We will have him follow-up as needed.  Procedure performed: First MCP joint corticosteroid injection; US guided  Consent obtained and verified. Time-out conducted. Noted no overlying erythema, induration, or other signs of local infection. The right first MCP was visualized with ultrasound. The over overlying skin was prepped prepped in a sterile  fashion. Topical analgesic spray: None. Joint: Right first MCP Needle: 25-gauge 1.5 inch Completed without difficulty. Meds: DepoMedrol 20 mg, lidocaine 0.5 cc  Advised to call if fevers/chills, erythema, induration, drainage, or persistent bleeding.   Arizona Constable, D.O.  PGY-4 [facility] Sports Medicine  07/13/2021 11:03 AM

## 2021-07-13 NOTE — Assessment & Plan Note (Deleted)
Known left first CMC arthritis and has done well with corticosteroid injection in the past.  We discussed that he can continue to use Voltaren gel or oral anti-inflammatories as needed if the pain recurs, but we can continue to repeat injections every 3 months if he continues to find this helpful.  He also has a brace for this.  Discussed the risk and benefits of proceeding with a corticosteroid injection he opts to proceed with this today.  We will have him follow-up as needed.  Procedure performed: First MCP joint corticosteroid injection; US guided  Consent obtained and verified. Time-out conducted. Noted no overlying erythema, induration, or other signs of local infection. The right first MCP was visualized with ultrasound. The over overlying skin was prepped prepped in a sterile fashion. Topical analgesic spray: None. Joint: Right first MCP Needle: 25-gauge 1.5 inch Completed without difficulty. Meds: DepoMedrol 20 mg, lidocaine 0.5 cc  Advised to call if fevers/chills, erythema, induration, drainage, or persistent bleeding.

## 2021-07-13 NOTE — Patient Instructions (Signed)
Thank you for coming to see me today. It was a pleasure. Today we talked about:   Today you received an injection with corticosteroid. This injection is usually done in response to pain and inflammation. There is some "numbing" medicine also in the shot so the injected area may be numb and feel really good for the next couple of hours. The numbing medicine usually wears off in 2-3 hours though, and then your pain level will be right back where it was before the injection.   The actually benefit from the steroid injection is usually noticed in 2-7 days. You may actually experience a small (as in 10%) INCREASE in pain in the first 24 hours---that is common.   Things to watch out for that you should contact us or a health care provider urgently would include: 1. Unusual (as in more than 10%) increase in pain 2. New fever > 101.5 3. New swelling or redness of the injected area.  4. Streaking of red lines around the area injected.   You can use motrin, ibuprofen, or voltaren gel as needed if your pain comes back.  Please follow-up with [place] as needed.  If you have any questions or concerns, please do not hesitate to call the office at [phone].  Best,   Arizona Constable, DO [place]

## 2021-07-14 NOTE — Progress Notes (Signed)
[facility]: Attending Note: I have reviewed the chart, discussed wit the Sports Medicine Fellow. I agree with assessment and treatment plan as detailed in the Fellow's note.  

## 2021-07-27 ENCOUNTER — Other Ambulatory Visit: Payer: Self-pay | Admitting: Family

## 2021-07-27 DIAGNOSIS — B20 Human immunodeficiency virus [HIV] disease: Secondary | ICD-10-CM

## 2021-08-04 DIAGNOSIS — Z419 Encounter for procedure for purposes other than remedying health state, unspecified: Secondary | ICD-10-CM | POA: Diagnosis not present

## 2021-08-10 ENCOUNTER — Encounter: Payer: Self-pay | Admitting: Student

## 2021-08-10 ENCOUNTER — Ambulatory Visit (INDEPENDENT_AMBULATORY_CARE_PROVIDER_SITE_OTHER): Payer: Medicaid Other | Admitting: Student

## 2021-08-10 DIAGNOSIS — R42 Dizziness and giddiness: Secondary | ICD-10-CM | POA: Insufficient documentation

## 2021-08-10 DIAGNOSIS — M79605 Pain in left leg: Secondary | ICD-10-CM

## 2021-08-10 DIAGNOSIS — I8392 Asymptomatic varicose veins of left lower extremity: Secondary | ICD-10-CM | POA: Insufficient documentation

## 2021-08-10 NOTE — Assessment & Plan Note (Signed)
Assessment: For the past few days the patient has had episodes of lightheadedness when going from a sitting to standing position for the past 3 days.  Once he sits down the lightheadedness resolves.  He describes the lightheadedness as feeling woozy.  He denies a room spinning around or feeling dizzy.  He denies recent illnesses, chest pain, shortness of breath, nausea, vomiting, diarrhea.  He denies any changes in his medications and has been taking them as prescribed.  The only change he has noticed is that he was having some nasal congestion and taking Flonase and Allegra which he normally takes.  He denies similar symptoms in the past when taking these medications.  He has a blood pressure cuff at home and notes that his blood pressures past few days have been 270B to 867J systolics.  He has not checked them with standing.  He states he has had similar symptoms in the past and was described with benign paroxysmal positional vertigo.  He was prescribed meclizine however the medication made him feel poorly and so he stopped taking it and was instructed to perform the Epley maneuver at home.  He has yet to try to perform the Epley maneuver.  Overall due to the positional nature of the symptoms differential includes orthostatic hypotension or vertigo exacerbation.  He has no shortness of breath or chest pain, pulmonary embolism is lower on my differential.  I do not believe he needs emergent imaging or to be evaluated in the emergency department.  He was given strict return precautions if the symptoms became persistent or worsened, any episodes of chest pain or shortness of breath, or any other life-threatening symptoms he needs to present to the emergency department immediately.  We will have him present in our clinic early next week.  Discussed staying hydrated and staying inside this weekend without performing exertional activity.  Plan: -Follow-up in clinic early next week -Strict return precautions  given -Discussed staying hydrated and resting this weekend -Attempt Epley maneuver at home to see if improves symptoms

## 2021-08-10 NOTE — Progress Notes (Signed)
Internal Medicine Clinic Attending  Case discussed with Dr. Katsadouros  At the time of the visit.  We reviewed the resident's history and exam and pertinent patient test results.  I agree with the assessment, diagnosis, and plan of care documented in the resident's note.  

## 2021-08-10 NOTE — Assessment & Plan Note (Addendum)
Assessment: Patient endorses left lower leg pain for the past week.  There are 2 areas causing him pain, his left ankle as well as the proximal portion of his tib-fib.  He describes the proximal pain as above his calf but below his knee.  He has a history of a sprained tibiofibular ligament of the left ankle.  He denies any recurrent injury to the ankle but states that it was swollen after working outside for some time.    He also endorses this pain in the posterior portion of his leg.  He notes that he would have noticed it until it started becoming painful with pressure and he also noticed some swelling as well.  He denies erythema to the area.  He denies any trauma to this area recently.  He did have a ladder hit the upper portion of his leg many years ago which caused a severe pain.  The pain he is having now is similar but less intense.  He denies shortness of breath or chest pain.  He denies any pain or swelling in his calf.  He denies any erythema of his lower extremity.  The patient is also endorsing episodes of lightheadedness (see lightheadedness problem), I do not believe he has had a DVT that has progressed to a pulmonary embolism and is causing his lightheadedness.  The lightheadedness is only with standing, is not persistent and improves with rest.  As mentioned he is not having episodes of chest pain or shortness of breath.  His ankle pain sounds like he resprained it while working.  The more proximal leg pain he is having location wise seems consistent with a Baker's cyst.  We will follow-up in the clinic with the patient early next week.  He was given stricter precautions if he had any episodes of chest pain shortness of breath or any other new concerning symptoms he needs to present to an urgent care or emergency department soon as possible.  Plan: -Follow-up in clinic next week -Rest lower extremity -Strict return precautions given if any new symptoms specifically chest pain or  shortness of breath he is to present to the emergency department

## 2021-08-10 NOTE — Progress Notes (Signed)
CC: Lightheadedness, left leg pain  This is a telephone encounter between Chad Little and Chad Little on 08/10/2021 for lightheadedness and left leg pain. The visit was conducted with the patient located at home and Chad Little at [facility]. The patient's identity was confirmed using their DOB and current address. The patient has consented to being evaluated through a telephone encounter and understands the associated risks (an examination cannot be done and the patient may need to come in for an appointment) / benefits (allows the patient to remain at home, decreasing exposure to coronavirus). I personally spent 13 minutes on medical discussion.   HPI:  Chad.Chad Little is a 61 y.o. with PMH as below.   Please see A&P for assessment of the patient's acute and chronic medical conditions.   Past Medical History:  Diagnosis Date   Arthritis    Enlarged prostate    Essential hypertension    GERD (gastroesophageal reflux disease)    Gout    diet controlled no recent flare ups   HIV infection ([place])    Review of Systems:  Pertinent positive lightheadedness, left ankle and posterior upper calf/knee pain .  Negatives of shortness of breath, chest pain, trauma  Assessment & Plan:   Lightheadedness Assessment: For the past few days the patient has had episodes of lightheadedness when going from a sitting to standing position for the past 3 days.  Once he sits down the lightheadedness resolves.  He describes the lightheadedness as feeling woozy.  He denies a room spinning around or feeling dizzy.  He denies recent illnesses, chest pain, shortness of breath, nausea, vomiting, diarrhea.  He denies any changes in his medications and has been taking them as prescribed.  The only change he has noticed is that he was having some nasal congestion and taking Flonase and Allegra which he normally takes.  He denies similar symptoms in the past when taking these medications.  He has a  blood pressure cuff at home and notes that his blood pressures past few days have been 809X to 833A systolics.  He has not checked them with standing.  He states he has had similar symptoms in the past and was described with benign paroxysmal positional vertigo.  He was prescribed meclizine however the medication made him feel poorly and so he stopped taking it and was instructed to perform the Epley maneuver at home.  He has yet to try to perform the Epley maneuver.  Overall due to the positional nature of the symptoms differential includes orthostatic hypotension or vertigo exacerbation.  He has no shortness of breath or chest pain, pulmonary embolism is lower on my differential.  I do not believe he needs emergent imaging or to be evaluated in the emergency department.  He was given strict return precautions if the symptoms became persistent or worsened, any episodes of chest pain or shortness of breath, or any other life-threatening symptoms he needs to present to the emergency department immediately.  We will have him present in our clinic early next week.  Discussed staying hydrated and staying inside this weekend without performing exertional activity.  Plan: -Follow-up in clinic early next week -Strict return precautions given -Discussed staying hydrated and resting this weekend -Attempt Epley maneuver at home to see if improves symptoms  Left leg pain Assessment: Patient endorses left lower leg pain for the past week.  There are 2 areas causing him pain, his left ankle as well as the proximal portion of his tib-fib.  He describes the proximal pain as above his calf but below his knee.  He has a history of a sprained tibiofibular ligament of the left ankle.  He denies any recurrent injury to the ankle but states that it was swollen after working outside for some time.    He also endorses this pain in the posterior portion of his leg.  He notes that he would have noticed it until it started  becoming painful with pressure and he also noticed some swelling as well.  He denies erythema to the area.  He denies any trauma to this area recently.  He did have a ladder hit the upper portion of his leg many years ago which caused a severe pain.  The pain he is having now is similar but less intense.  He denies shortness of breath or chest pain.  He denies any pain or swelling in his calf.  He denies any erythema of his lower extremity.  The patient is also endorsing episodes of lightheadedness (see lightheadedness problem), I do not believe he has had a DVT that has progressed to a pulmonary embolism and is causing his lightheadedness.  The lightheadedness is only with standing, is not persistent and improves with rest.  As mentioned he is not having episodes of chest pain or shortness of breath.  His ankle pain sounds like he resprained it while working.  The more proximal leg pain he is having location wise seems consistent with a Baker's cyst.  We will follow-up in the clinic with the patient early next week.  He was given stricter precautions if he had any episodes of chest pain shortness of breath or any other new concerning symptoms he needs to present to an urgent care or emergency department soon as possible.  Plan: -Follow-up in clinic next week -Rest lower extremity -Strict return precautions given if any new symptoms specifically chest pain or shortness of breath he is to present to the emergency department    Patient discussed with Dr. Verlee Rossetti Internal Medicine Resident

## 2021-08-15 ENCOUNTER — Encounter: Payer: Self-pay | Admitting: Student

## 2021-08-15 ENCOUNTER — Other Ambulatory Visit: Payer: Self-pay

## 2021-08-15 ENCOUNTER — Ambulatory Visit: Payer: Medicaid Other | Admitting: Student

## 2021-08-15 DIAGNOSIS — I8392 Asymptomatic varicose veins of left lower extremity: Secondary | ICD-10-CM | POA: Diagnosis not present

## 2021-08-15 DIAGNOSIS — I82890 Acute embolism and thrombosis of other specified veins: Secondary | ICD-10-CM | POA: Diagnosis not present

## 2021-08-15 DIAGNOSIS — N529 Male erectile dysfunction, unspecified: Secondary | ICD-10-CM | POA: Insufficient documentation

## 2021-08-15 DIAGNOSIS — I10 Essential (primary) hypertension: Secondary | ICD-10-CM

## 2021-08-15 MED ORDER — SILDENAFIL CITRATE 50 MG PO TABS: 50.0000 mg | ORAL_TABLET | ORAL | 1 refills | Status: DC | PRN

## 2021-08-15 NOTE — Assessment & Plan Note (Signed)
Assessment: Patient with history of varicose veins presents with "knot' inferior to posterior knee. Following up from our telehealth appointment last Friday. Denies improvement, continuing to have pain and discomfort when pressure is applied to the area. Denies lower extremity swelling, warmth or redness. Denies worsening of the pain. Denies shortness of breath or chest pain. On bedside US no evidence of SVT, vein compressible. Consistent with varicose veins. Instructed patient to start warm compresses and apply compression stockings. We had discussed compression stockings during his appointment last year however he did not purchase them.   Plan: -warm compresses and compression stockings -if become bothersome to him, instructed patient to call clinic and can place referral to vascular

## 2021-08-15 NOTE — Assessment & Plan Note (Addendum)
Assessment: Patient presents with longstanding history of erectile dysfunction.  States this has occurred on before.  He is TURP.  He feels as though he is getting an erection but wanted to monitor.  IIEF score of 11, classifying his ED moderate.  With his history of hypertension, prediabetes suspect this is secondary to vascular etiologies.  We will start sildenafil 50 mg as needed.  Discussed taking this medication on empty stomach and  not taking long-acting nitrates  Plan: -Sildenafil 50 mg -Follow-up with responses to therapy, if needed can uptitrate to 100 mg

## 2021-08-15 NOTE — Assessment & Plan Note (Signed)
Assessment: BP of 134/80. Current regimen of olmesartan-HCTZ 40-25 mg. Continue to monitor and if persistently above 130/80, consider addition of amlodipine.   Plan: -continue olmesartan-HCTZ

## 2021-08-15 NOTE — Progress Notes (Signed)
CC: Left leg pain  HPI:  [name] is a 61 y.o. male living with a history stated below and presents today for left leg pain. Please see problem based assessment and plan for additional details.  Past Medical History:  Diagnosis Date   Arthritis    Enlarged prostate    Essential hypertension    GERD (gastroesophageal reflux disease)    Gout    diet controlled no recent flare ups   HIV infection ([place])     Current Outpatient Medications on File Prior to Visit  Medication Sig Dispense Refill   atorvastatin (LIPITOR) 10 MG tablet Take 1 tablet (10 mg total) by mouth daily. 30 tablet 11   dolutegravir (TIVICAY) 50 MG tablet Take 1 tablet (50 mg total) by mouth daily. 30 tablet 5   emtricitabine-tenofovir AF (DESCOVY) 200-25 MG tablet Take 1 tablet by mouth daily. 30 tablet 5   fluticasone (FLONASE) 50 MCG/ACT nasal spray Place 1 spray into both nostrils daily. 11.1 g 3   olmesartan-hydrochlorothiazide (BENICAR HCT) 40-25 MG tablet Take 1 tablet by mouth daily. 90 tablet 3   pantoprazole (PROTONIX) 40 MG tablet Take 1 tablet (40 mg total) by mouth daily. 90 tablet 1   No current facility-administered medications on file prior to visit.    Family History  Problem Relation Age of Onset   Alcohol abuse Mother    Hypertension Father    Stroke Father     Social History   Socioeconomic History   Marital status: Single    Spouse name: Not on file   Number of children: Not on file   Years of education: Not on file   Highest education level: Not on file  Occupational History   Not on file  Tobacco Use   Smoking status: Never   Smokeless tobacco: Never  Vaping Use   Vaping Use: Never used  Substance and Sexual Activity   Alcohol use: Not Currently    Alcohol/week: 0.0 standard drinks of alcohol    Comment: none since 2017   Drug use: Not Currently    Types: Marijuana, "Crack" cocaine    Comment:  cocaine and marijuana none since 2017   Sexual activity: Not  Currently    Comment: refused  condoms 11/09/2019  Other Topics Concern   Not on file  Social History Narrative   Not on file   Social Determinants of Health   Financial Resource Strain: Not on file  Food Insecurity: Not on file  Transportation Needs: Not on file  Physical Activity: Not on file  Stress: Not on file  Social Connections: Not on file  Intimate Partner Violence: Not on file    Review of Systems: ROS negative except for what is noted on the assessment and plan.  Vitals:   08/15/21 0919  BP: 134/80  Pulse: 84  Temp: 97.9 F (36.6 C)  TempSrc: Oral  SpO2: 98%  Weight: 256 lb 12.8 oz (116.5 kg)  Height: '6\' 5"'$  (1.956 m)    Physical Exam: Constitutional: in no acute distress HENT: normocephalic atraumatic, mucous membranes moist Eyes: conjunctiva non-erythematous Neck: supple Cardiovascular: regular rate and rhythm, no m/r/g Pulmonary/Chest: normal work of breathing on room air, lungs clear to auscultation bilaterally MSK: normal bulk and tone. Swollen, painful, fluctuant nodule posterior inferior left knee. No calf swelling, erythema, warmth.  Neurological: alert & oriented x 3 Skin: warm and dry Psych: normal mood  Assessment & Plan:   Hypertension Assessment: BP of 134/80. Current regimen  of olmesartan-HCTZ 40-25 mg. Continue to monitor and if persistently above 130/80, consider addition of amlodipine.   Plan: -continue olmesartan-HCTZ  Varicose veins of left lower extremity Assessment: Patient with history of varicose veins presents with "knot' inferior to posterior knee. Following up from our telehealth appointment last Friday. Denies improvement, continuing to have pain and discomfort when pressure is applied to the area. Denies lower extremity swelling, warmth or redness. Denies worsening of the pain. Denies shortness of breath or chest pain. On bedside US no evidence of SVT, vein compressible. Consistent with varicose veins. Instructed patient to  start warm compresses and apply compression stockings. We had discussed compression stockings during his appointment last year however he did not purchase them.   Plan: -warm compresses and compression stockings -if become bothersome to him, instructed patient to call clinic and can place referral to vascular   Erectile dysfunction Assessment: Patient presents with longstanding history of erectile dysfunction.  States this has occurred on before.  He is TURP.  He feels as though he is getting an erection but wanted to monitor.  IIEF score of 11, classifying his ED moderate.  With his history of hypertension, prediabetes suspect this is secondary to vascular etiologies.  We will start sildenafil 50 mg as needed.  Discussed taking this medication on empty stomach and  not taking long-acting nitrates  Plan: -Sildenafil 50 mg -Follow-up with responses to therapy, if needed can uptitrate to 100 mg  Patient seen with Dr. Laurena Slimmer, D.O. [facility], PGY-3 Phone: [phone] Date 08/15/2021 Time 4:15 PM

## 2021-08-15 NOTE — Assessment & Plan Note (Signed)
No evidence of SVT on exam today with ultrasound.

## 2021-08-15 NOTE — Patient Instructions (Signed)
Thank you, Chad Little for allowing [place] to provide your care today. Today we discussed.  Leg Pain You have varicose veins, which are dilated leg veins. If you feel as though these are becoming an nuisance, please let us know and we can refer you to vascular surgery  Erectile Dysfunction I will be sending in a prescription for  viagra. Please take on an empty stomach 30 min to 4 hours before sexual activity. Do not take with nitroglycerin medications  Shoulder Pain Please take tylenol 1 pill every 6 hours for pain. Use heating pad as well. If no improvement consider following up with your sports medicine doctor.   I have ordered the following labs for you:  Lab Orders  No laboratory test(s) ordered today      Referrals ordered today:   Referral Orders  No referral(s) requested today     I have ordered the following medication/changed the following medications:   Stop the following medications: There are no discontinued medications.   Start the following medications: Meds ordered this encounter  Medications   sildenafil (VIAGRA) 50 MG tablet    Sig: Take 1 tablet (50 mg total) by mouth as needed for erectile dysfunction.    Dispense:  20 tablet    Refill:  1     Follow up: 3 months    Should you have any questions or concerns please call the internal medicine clinic at [phone].    [name], D.O. [place]

## 2021-08-15 NOTE — Assessment & Plan Note (Deleted)
Assessment: BP of 134/80. Current regimen of olmesartan-HCTZ 40-25 mg. Continue to monitor and if persistently above 130/80, consider addition of amlodipine.   Plan: -continue olmesartan-HCTZ

## 2021-08-16 ENCOUNTER — Telehealth: Payer: Self-pay | Admitting: *Deleted

## 2021-08-16 ENCOUNTER — Other Ambulatory Visit (HOSPITAL_COMMUNITY): Payer: Self-pay

## 2021-08-16 MED ORDER — SILDENAFIL CITRATE 50 MG PO TABS
50.0000 mg | ORAL_TABLET | ORAL | 1 refills | Status: DC | PRN
  Filled 2021-08-16: qty 20, 20d supply, fill #0

## 2021-08-16 NOTE — Telephone Encounter (Signed)
Prescription sent

## 2021-08-16 NOTE — Telephone Encounter (Signed)
Patient called in stating [facility] will not pay for sildenafil and it will cost $30 out of pocket. He is requesting this Rx be resent to [facility]

## 2021-08-16 NOTE — Progress Notes (Signed)
Internal Medicine Clinic Attending  I saw and evaluated the patient.  I personally confirmed the key portions of the history and exam documented by Dr. Katsadouros and I reviewed pertinent patient test results.  The assessment, diagnosis, and plan were formulated together and I agree with the documentation in the resident's note.  

## 2021-08-17 ENCOUNTER — Other Ambulatory Visit (HOSPITAL_COMMUNITY): Payer: Self-pay

## 2021-08-20 ENCOUNTER — Other Ambulatory Visit: Payer: Self-pay

## 2021-08-20 MED ORDER — PANTOPRAZOLE SODIUM 40 MG PO TBEC: 40.0000 mg | DELAYED_RELEASE_TABLET | Freq: Every day | ORAL | 1 refills | Status: DC

## 2021-08-27 ENCOUNTER — Emergency Department (HOSPITAL_COMMUNITY)
Admission: EM | Admit: 2021-08-27 | Discharge: 2021-08-27 | Disposition: A | Discharge status: Home / Self Care | Payer: Medicaid Other | Attending: Emergency Medicine | Admitting: Emergency Medicine

## 2021-08-27 ENCOUNTER — Other Ambulatory Visit: Payer: Self-pay

## 2021-08-27 DIAGNOSIS — M62830 Muscle spasm of back: Secondary | ICD-10-CM | POA: Diagnosis not present

## 2021-08-27 DIAGNOSIS — M549 Dorsalgia, unspecified: Secondary | ICD-10-CM | POA: Diagnosis present

## 2021-08-27 MED ORDER — METHOCARBAMOL 500 MG PO TABS: 500.0000 mg | ORAL_TABLET | Freq: Two times a day (BID) | ORAL | 0 refills | Status: DC

## 2021-08-27 MED ORDER — TRAMADOL HCL 50 MG PO TABS
50.0000 mg | ORAL_TABLET | Freq: Four times a day (QID) | ORAL | 0 refills | Status: DC | PRN
Start: 2021-08-27 — End: 2023-04-18

## 2021-08-27 NOTE — Discharge Instructions (Signed)
Follow-up as soon as possible with your primary care physician. SEEK IMMEDIATE MEDICAL ATTENTION IF: New numbness, tingling, weakness, or problem with the use of your arms or legs.  Severe back pain not relieved with medications.  Change in bowel or bladder control.  Increasing pain in any areas of the body (such as chest or abdominal pain).  Shortness of breath, dizziness or fainting.  Nausea (feeling sick to your stomach), vomiting, fever, or sweats.

## 2021-08-27 NOTE — ED Provider Notes (Signed)
[facility] EMERGENCY DEPARTMENT Provider Note   CSN: [id number] Arrival date & time: 08/27/21  1548     History  Chief Complaint  Patient presents with   Back Pain    Chad Little is a 61 y.o. male presents emergency department with a chief complaint of back pain.  Patient has had intermittent mid thoracic back pain for the past 3 months.  For the past couple of days he has had worsening mid back pain worse with movement and standing, better with rest, worse with twisting, no weakness in the lower or upper extremities, no injuries, no fever, no chills, no procedures to back.   Back Pain      Home Medications Prior to Admission medications   Medication Sig Start Date End Date Taking? Authorizing Provider  methocarbamol (ROBAXIN) 500 MG tablet Take 1 tablet (500 mg total) by mouth 2 (two) times daily. 08/27/21  Yes Whittley Carandang, PA-C  traMADol (ULTRAM) 50 MG tablet Take 1 tablet (50 mg total) by mouth every 6 (six) hours as needed. 08/27/21  Yes Tawonda Legaspi, PA-C  atorvastatin (LIPITOR) 10 MG tablet Take 1 tablet (10 mg total) by mouth daily. 10/19/20 10/19/21  Madalyn Rob, MD  dolutegravir (TIVICAY) 50 MG tablet Take 1 tablet (50 mg total) by mouth daily. 05/02/21   Golden Circle, FNP  emtricitabine-tenofovir AF (DESCOVY) 200-25 MG tablet Take 1 tablet by mouth daily. 05/02/21   Golden Circle, FNP  fluticasone (FLONASE) 50 MCG/ACT nasal spray Place 1 spray into both nostrils daily. 06/11/21   Madalyn Rob, MD  olmesartan-hydrochlorothiazide (BENICAR HCT) 40-25 MG tablet Take 1 tablet by mouth daily. 06/11/21   Madalyn Rob, MD  pantoprazole (PROTONIX) 40 MG tablet Take 1 tablet (40 mg total) by mouth daily. 08/20/21 02/16/22  Gaylan Gerold, DO  sildenafil (VIAGRA) 50 MG tablet Take 1 tablet (50 mg total) by mouth as needed for erectile dysfunction. 08/16/21   Riesa Pope, MD      Allergies    Patient has no known allergies.    Review of  Systems   Review of Systems  Musculoskeletal:  Positive for back pain.    Physical Exam Updated Vital Signs BP 134/80   Pulse 94   Temp 97.9 F (36.6 C) (Oral)   Resp 13   SpO2 99%  Physical Exam Vitals and nursing note reviewed.  Constitutional:      General: He is not in acute distress.    Appearance: He is well-developed. He is not diaphoretic.  HENT:     Head: Normocephalic and atraumatic.  Eyes:     General: No scleral icterus.    Conjunctiva/sclera: Conjunctivae normal.  Cardiovascular:     Rate and Rhythm: Normal rate and regular rhythm.     Heart sounds: Normal heart sounds.  Pulmonary:     Effort: Pulmonary effort is normal. No respiratory distress.     Breath sounds: Normal breath sounds.  Abdominal:     Palpations: Abdomen is soft.     Tenderness: There is no abdominal tenderness.  Musculoskeletal:     Cervical back: Normal range of motion and neck supple.     Comments: Mid thoracic bilateral back spasm worse on the right, limited range of motion, normal movement and strength of the upper and lower extremities  Skin:    General: Skin is warm and dry.  Neurological:     Mental Status: He is alert.  Psychiatric:        Behavior:  Behavior normal.     ED Results / Procedures / Treatments   Labs (all labs ordered are listed, but only abnormal results are displayed) Labs Reviewed - No data to display  EKG None  Radiology No results found.  Procedures Procedures    Medications Ordered in ED Medications - No data to display  ED Course/ Medical Decision Making/ A&P                           Medical Decision Making Patient with back pain.  No neurological deficits and normal neuro exam.  Patient can walk but states is painful.  No loss of bowel or bladder control.  No concern for cauda equina.  No fever, night sweats, weight loss, h/o cancer, IVDU.  RICE protocol and pain medicine indicated and discussed with patient.  PDMP reviewed during this  encounter.     Final Clinical Impression(s) / ED Diagnoses Final diagnoses:  Muscle spasm of back    Rx / DC Orders ED Discharge Orders          Ordered    traMADol (ULTRAM) 50 MG tablet  Every 6 hours PRN        08/27/21 1635    methocarbamol (ROBAXIN) 500 MG tablet  2 times daily        08/27/21 1635              Margarita Mail, PA-C 08/27/21 1638    Ezequiel Essex, MD 08/27/21 2138

## 2021-08-27 NOTE — ED Triage Notes (Signed)
Pt c/o intermittent back pain x17mo, worse over past 4 days. Worse w movement. Denies urinary symptoms, CP, SHOB, N/V  Tylenol, lidocaine patches for pain

## 2021-08-28 ENCOUNTER — Encounter: Payer: Self-pay | Admitting: Student

## 2021-08-28 ENCOUNTER — Ambulatory Visit: Payer: Medicaid Other | Admitting: Student

## 2021-08-28 VITALS — BP 131/93 | HR 67 | Temp 97.6°F | Wt 259.0 lb

## 2021-08-28 DIAGNOSIS — M546 Pain in thoracic spine: Secondary | ICD-10-CM | POA: Diagnosis not present

## 2021-08-28 DIAGNOSIS — M545 Low back pain, unspecified: Secondary | ICD-10-CM

## 2021-08-28 MED ORDER — METHOCARBAMOL 500 MG PO TABS: 500.0000 mg | ORAL_TABLET | Freq: Two times a day (BID) | ORAL | 0 refills | Status: DC

## 2021-08-28 NOTE — Progress Notes (Signed)
CC: Thoracic back pain  HPI:  [name] is a 61 y.o. male living with a history stated below and presents today for back pain. Please see problem based assessment and plan for additional details.  Past Medical History:  Diagnosis Date   Arthritis    Enlarged prostate    Essential hypertension    GERD (gastroesophageal reflux disease)    Gout    diet controlled no recent flare ups   HIV infection ([place])     Current Outpatient Medications on File Prior to Visit  Medication Sig Dispense Refill   atorvastatin (LIPITOR) 10 MG tablet Take 1 tablet (10 mg total) by mouth daily. 30 tablet 11   dolutegravir (TIVICAY) 50 MG tablet Take 1 tablet (50 mg total) by mouth daily. 30 tablet 5   emtricitabine-tenofovir AF (DESCOVY) 200-25 MG tablet Take 1 tablet by mouth daily. 30 tablet 5   fluticasone (FLONASE) 50 MCG/ACT nasal spray Place 1 spray into both nostrils daily. 11.1 g 3   olmesartan-hydrochlorothiazide (BENICAR HCT) 40-25 MG tablet Take 1 tablet by mouth daily. 90 tablet 3   pantoprazole (PROTONIX) 40 MG tablet Take 1 tablet (40 mg total) by mouth daily. 90 tablet 1   sildenafil (VIAGRA) 50 MG tablet Take 1 tablet (50 mg total) by mouth as needed for erectile dysfunction. 20 tablet 1   traMADol (ULTRAM) 50 MG tablet Take 1 tablet (50 mg total) by mouth every 6 (six) hours as needed. 15 tablet 0   No current facility-administered medications on file prior to visit.    Family History  Problem Relation Age of Onset   Alcohol abuse Mother    Hypertension Father    Stroke Father     Social History   Socioeconomic History   Marital status: Single    Spouse name: Not on file   Number of children: Not on file   Years of education: Not on file   Highest education level: Not on file  Occupational History   Not on file  Tobacco Use   Smoking status: Never   Smokeless tobacco: Never  Vaping Use   Vaping Use: Never used  Substance and Sexual Activity   Alcohol  use: Not Currently    Alcohol/week: 0.0 standard drinks of alcohol    Comment: none since 2017   Drug use: Not Currently    Types: Marijuana, "Crack" cocaine    Comment:  cocaine and marijuana none since 2017   Sexual activity: Not Currently    Comment: refused  condoms 11/09/2019  Other Topics Concern   Not on file  Social History Narrative   Not on file   Social Determinants of Health   Financial Resource Strain: Not on file  Food Insecurity: Not on file  Transportation Needs: Not on file  Physical Activity: Not on file  Stress: Not on file  Social Connections: Not on file  Intimate Partner Violence: Not on file    Review of Systems: ROS negative except for what is noted on the assessment and plan.  Vitals:   08/28/21 1424  BP: (!) 131/93  Pulse: 67  Temp: 97.6 F (36.4 C)  TempSrc: Oral  SpO2: 98%  Weight: 259 lb (117.5 kg)    Physical Exam: Constitutional: well-appearing man, in no acute distress HENT: normocephalic atraumatic, mucous membranes moist Cardiovascular: regular rate and rhythm, no m/r/g Pulmonary/Chest: normal work of breathing on room air, lungs clear to auscultation bilaterally Abdominal: soft, non-tender, non-distended MSK: normal bulk and tone,  R thoracic spine tender to palpation Neurological: alert & oriented x 3, 5/5 strength in bilateral upper and lower extremities, normal gait, normal neurologic exam Skin: warm and dry  Assessment & Plan:   Thoracic back pain Pt has been complaining of intermittent back pain for the last 6 months and states that the pain got worse on Saturday after helping setting up for a wedding. He went to the Emergency Department on Saturday 7/22 and they diagnosed him with muscle spasms. He was discharged from the ED with tramadol and Robaxin. He states that using these medications have helped him slightly. He has worked in [employer] his whole life.   He states the pain is exacerbated by standing up and walking,  and gets some relief by sitting down. Before Saturday when the pain would come on he would use tylenol and lidocaine patches, which would work before. On Saturday he stated the pain was 8/10. The pain is on the right side of the thoracic spine, and radiates laterally.   On PE The affected area is tender to palpation, and when the patient bends to the right the pain is exacerbated.   He denies any nausea, vomiting, urinary symptoms, fever, or illicit drug use.   Plan:   1. I placed a referral for physical therapy 2. I gave him Robaxin 500 mg for 15 days to help with the pain 3. Advised him to continue using Tylenol and Lidocaine patches, and avoid NSAIDs as it could exacerbate his acid reflux.  Patient seen with Dr. Tobi Bastos Chad Little, M.D. [facility], PGY-1 Phone: [phone] Date 08/28/2021 Time 7:41 PM

## 2021-08-28 NOTE — Assessment & Plan Note (Addendum)
Pt has been complaining of intermittent back pain for the last 6 months and states that the pain got worse on Saturday after helping setting up for a wedding. He went to the Emergency Department on Saturday 7/22 and they diagnosed him with muscle spasms. He was discharged from the ED with tramadol and Robaxin. He states that using these medications have helped him slightly. He has worked in [employer] his whole life.   He states the pain is exacerbated by standing up and walking, and gets some relief by sitting down. Before Saturday when the pain would come on he would use tylenol and lidocaine patches, which would work before. On Saturday he stated the pain was 8/10. The pain is on the right side of the thoracic spine, and radiates laterally.   On PE The affected area is tender to palpation, and when the patient bends to the right the pain is exacerbated.   He denies any nausea, vomiting, urinary symptoms, fever, or illicit drug use.   Plan:   1. I placed a referral for physical therapy 2. I gave him Robaxin 500 mg for 15 days to help with the pain 3. Advised him to continue using Tylenol and Lidocaine patches as needed, and avoid NSAIDs as it could exacerbate his acid reflux.

## 2021-08-28 NOTE — Patient Instructions (Signed)
Thank you so much for coming to the clinic today, it was a pleasure meeting you!  We talked about a few things today, hears just a quick summary.  So sorry you are dealing with this back pain, but in order to combat this I am going to place a referral to physical therapy.  I am also going to fill out a prescription for the meth carbinol that the emergency department gave you.  You can still use Tylenol, and you can still use lidocaine patches as well as you see fit.  However please avoid things like Aleve, Advil, ibuprofen, naproxen, or Motrin.    If you have any questions please feel free to call the clinic at [phone]

## 2021-08-29 NOTE — Progress Notes (Signed)
Internal Medicine Clinic Attending  I saw and evaluated the patient.  I personally confirmed the key portions of the history and exam documented by Dr. Nooruddin and I reviewed pertinent patient test results.  The assessment, diagnosis, and plan were formulated together and I agree with the documentation in the resident's note.  

## 2021-09-04 ENCOUNTER — Ambulatory Visit: Payer: Medicaid Other | Admitting: Podiatry

## 2021-09-04 ENCOUNTER — Encounter: Payer: Self-pay | Admitting: Podiatry

## 2021-09-04 DIAGNOSIS — L603 Nail dystrophy: Secondary | ICD-10-CM | POA: Diagnosis not present

## 2021-09-04 DIAGNOSIS — Z419 Encounter for procedure for purposes other than remedying health state, unspecified: Secondary | ICD-10-CM | POA: Diagnosis not present

## 2021-09-04 NOTE — Progress Notes (Signed)
This patient presents to the office stating he has pain in both big toes.  He says he has surgery for the permanent nail removal right big toe. Patient had permanent big toenail removal  in 2021.  He says the great toes has developed a crust as well as nail at surgical sites.   He says the areas on both big toes are hard and painful.  He says he soaks his feet but the hard crust returns.  He presents for an evaluation and treatment.  Vascular  Dorsalis pedis and posterior tibial pulses are palpable  B/L.  Capillary return  WNL.  Temperature gradient is  WNL.  Skin turgor  WNL  Sensorium  Senn Weinstein monofilament wire  WNL. Normal tactile sensation.  Nail Exam  Patient has normal nails with no evidence of bacterial or fungal infection. Hard crusty skin at site of nail bed both halluces.  Orthopedic  Exam  Muscle tone and muscle strength  WNL.  No limitations of motion feet  B/L.  No crepitus or joint effusion noted.  Foot type is unremarkable and digits show no abnormalities.  Bony prominences are unremarkable.  Skin  No open lesions.  Normal skin texture and turgor.   Nail Dystrophy Hallux  B/L.  Debride hallux nails  B/l.   RTC prn   Gardiner Barefoot DPM

## 2021-09-19 NOTE — Progress Notes (Unsigned)
   CC: Blurry Vision  HPI:   [name] is a 61 y.o. with a past medical history of hypertension, HIV, and erectile dysfunction who presents for 6 to 7 months of blurry vision in L eye. He was last seen at [facility] on July 25.    Past Medical History:  Diagnosis Date   Arthritis    Enlarged prostate    Essential hypertension    GERD (gastroesophageal reflux disease)    Gout    diet controlled no recent flare ups   HIV infection ([facility])      Review of Systems:    Reports blurry vision, headaches Denies fever, vision loss, chills, cough, nausea, vomiting   Physical Exam:  Vitals:   09/20/21 0936  BP: 128/89  Pulse: 86  Temp: 97.9 F (36.6 C)  TempSrc: Oral  SpO2: 98%  Weight: 255 lb 12.8 oz (116 kg)    General:   awake and alert, sitting comfortably in chair, cooperative, not in acute distress Skin:   warm and dry, intact without any obvious lesions or scars, no rashes or lesions  Head:   normocephalic and atraumatic Eyes:   extraocular eye movements intact bilaterally, nystagmus absent, all visual fields intact; no conjunctival erythema or discharge, pupils equal and reactive to light, vision 20/20 on R and 20/200 on L Lungs:   normal respiratory effort, breathing unlabored, symmetrical chest rise, no crackles or wheezing Cardiac:   regular rate and rhythm, normal S1 and S2 Neurologic:   oriented to person-place-time, moving all extremities, no facial droop Psychiatric:   mood and affect normal, intelligible speech    Assessment & Plan:   Prediabetes Patient's last A1c was 5.8 in February 2022. His A1C today was 6.2.  -Counsel on limiting sugar intake, specifically less sweetener with his coffee in the morning -Return to clinic in three months and recheck A1C   Blurry vision, left eye Patient has been experienced blurry vision in his left eye that started about 6 or 7 months ago.  He has not been to an ophthalmologist in 5 years.  His vision is  episodically fuzzy.  When this happens, about 2-4 per day, blink his eyes until it waters, which improves his vision for a few minutes.  This blurry vision affects both far and near sight.  It happens when he is reading or on the phone, not when he is driving.  He uses glasses for reading.  Reports occasional itching, tearing, and headaches when he wears glasses.  Denies fever, chills, eye redness, discharge, and pain. He has not tried any eyedrops or used anything for his blurry vision. On exam, his extraocular eye movements were intact bilaterally, nystagmus absent, and visual fields intact. No conjunctival erythema or discharge.  Pupils were equal and reactive to light.  Vision is 20/20 in his right eye and 20/200 in his left. This presentation is inconsistent with retinal detachment, glaucoma, or retinal artery occlusion. His associated headaches are likely from eye strain.  -Refer to ophthalmology for further evaluation   Healthcare Maintenance Last colonoscopy was in 2018, 5 years ago. Per patient, it revealed some polyps.  -Referral to GI for screening colonoscopy    See Encounters Tab for problem based charting.  Patient seen with Dr. Philipp Ovens

## 2021-09-20 ENCOUNTER — Ambulatory Visit: Payer: Medicaid Other | Admitting: Student

## 2021-09-20 VITALS — BP 128/89 | HR 86 | Temp 97.9°F | Wt 255.8 lb

## 2021-09-20 DIAGNOSIS — Z0000 Encounter for general adult medical examination without abnormal findings: Secondary | ICD-10-CM

## 2021-09-20 DIAGNOSIS — H538 Other visual disturbances: Secondary | ICD-10-CM

## 2021-09-20 DIAGNOSIS — R7303 Prediabetes: Secondary | ICD-10-CM | POA: Diagnosis not present

## 2021-09-20 LAB — GLUCOSE, CAPILLARY: Glucose-Capillary: 101 mg/dL — ABNORMAL HIGH (ref 70–99)

## 2021-09-20 LAB — POCT GLYCOSYLATED HEMOGLOBIN (HGB A1C): Hemoglobin A1C: 6.2 % — AB (ref 4.0–5.6)

## 2021-09-20 NOTE — Patient Instructions (Signed)
  Thank you, Mr.Isaiahs KYE HEDDEN, for allowing [place] to provide your care today. Today we discussed . . .  > Blurry Vision       - we checked your vision, which was reduced in your left eye       - we have placed a referral to ophthalmology, they should contact you to schedule an appointment > Colonoscopy       - we have ordered a screening colonoscopy, which will screen for colon cancer > Prediabetes       - try to reduce your intake of sugary foods, we have ordered an A1C test today and will call you with the results   I have ordered the following labs for you:   Lab Orders         POC Hbg A1C       Tests ordered today:  None   Referrals ordered today:    Referral Orders         Ambulatory referral to Ophthalmology         Ambulatory referral to Gastroenterology       I have ordered the following medication/changed the following medications:   Stop the following medications: There are no discontinued medications.   Start the following medications: No orders of the defined types were placed in this encounter.     Follow up: 3 months    Remember:  Please see the eye doctor for your blurry vision and continue to limit your sugar intake. We will see you back in clinic in about three months!   Should you have any questions or concerns please call the internal medicine clinic at [phone].     Roswell Nickel, MD [place]

## 2021-09-20 NOTE — Assessment & Plan Note (Signed)
Patient has been experienced blurry vision in his left eye that started about 6 or 7 months ago.  He has not been to an ophthalmologist in 5 years.  His vision is episodically fuzzy.  When this happens, about 2-4 per day, blink his eyes until it waters, which improves his vision for a few minutes.  This blurry vision affects both far and near sight.  It happens when he is reading or on the phone, not when he is driving.  He uses glasses for reading.  Reports occasional itching, tearing, and headaches when he wears glasses.  Denies fever, chills, eye redness, discharge, and pain. He has not tried any eyedrops or used anything for his blurry vision. On exam, his extraocular eye movements were intact bilaterally, nystagmus absent, and visual fields intact. No conjunctival erythema or discharge.  Pupils were equal and reactive to light.  Vision is 20/20 in his right eye and 20/200 in his left. This presentation is inconsistent with retinal detachment, glaucoma, or retinal artery occlusion. His associated headaches are likely from eye strain.  -Refer to ophthalmology for further evaluation

## 2021-09-20 NOTE — Assessment & Plan Note (Signed)
Patient's last A1c was 5.8 in February 2022. His A1C today was 6.2.  -Counsel on limiting sugar intake, specifically less sweetener with his coffee in the morning -Return to clinic in three months and recheck A1C

## 2021-09-25 ENCOUNTER — Other Ambulatory Visit: Payer: Self-pay

## 2021-09-25 ENCOUNTER — Ambulatory Visit: Payer: Medicaid Other | Attending: Internal Medicine

## 2021-09-25 DIAGNOSIS — M5459 Other low back pain: Secondary | ICD-10-CM | POA: Insufficient documentation

## 2021-09-25 DIAGNOSIS — R252 Cramp and spasm: Secondary | ICD-10-CM | POA: Insufficient documentation

## 2021-09-25 DIAGNOSIS — M545 Low back pain, unspecified: Secondary | ICD-10-CM | POA: Diagnosis not present
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
## 2021-09-27 NOTE — Progress Notes (Signed)
Internal Medicine Clinic Attending  I saw and evaluated the patient.  I personally confirmed the key portions of the history and exam documented by Dr. Harper and I reviewed pertinent patient test results.  The assessment, diagnosis, and plan were formulated together and I agree with the documentation in the resident's note.  

## 2021-10-01 ENCOUNTER — Other Ambulatory Visit: Payer: Self-pay

## 2021-10-01 DIAGNOSIS — I10 Essential (primary) hypertension: Secondary | ICD-10-CM

## 2021-10-01 MED ORDER — OLMESARTAN MEDOXOMIL-HCTZ 40-25 MG PO TABS: 1.0000 | ORAL_TABLET | Freq: Every day | ORAL | 3 refills | Status: DC

## 2021-10-01 MED ORDER — PANTOPRAZOLE SODIUM 40 MG PO TBEC
40.0000 mg | DELAYED_RELEASE_TABLET | Freq: Two times a day (BID) | ORAL | 0 refills | Status: DC
Start: 2021-10-01 — End: 2021-12-21

## 2021-10-01 NOTE — Telephone Encounter (Signed)
Call to patient has been taking the Pantoprazole 2 times a day instead of 1 time.   States is working better taking twice daily. Was told to do in the past and prescription was not changed .  Would like to continue 2 times a day.

## 2021-10-01 NOTE — Telephone Encounter (Signed)
Questions about olmesartan-hydrochlorothiazide (BENICAR HCT) 40-25 MG tablet, please call pt back.

## 2021-10-02 NOTE — Therapy (Signed)
OUTPATIENT PHYSICAL THERAPY TREATMENT NOTE   Patient Name: Chad Little MRN: [id number] DOB:[birth year], 61 y.o., male Today's Date: 10/03/2021  PCP: Serita Butcher, MD REFERRING PROVIDER: Lottie Mussel, MD  END OF SESSION:   PT End of Session - 10/03/21 0949     Visit Number 1    Date for PT Re-Evaluation 11/16/21    Authorization Type [place] MEDICAID [facility]    PT Start Time 0946    PT Stop Time 1026    PT Time Calculation (min) 40 min    Activity Tolerance Patient tolerated treatment well    Behavior During Therapy [facility] for tasks assessed/performed             Past Medical History:  Diagnosis Date   Arthritis    Enlarged prostate    Essential hypertension    GERD (gastroesophageal reflux disease)    Gout    diet controlled no recent flare ups   HIV infection [facility])    Past Surgical History:  Procedure Laterality Date   APPENDECTOMY  2005   open   Right elbow surgery  dec3 2020   in [place]   TRANSURETHRAL RESECTION OF PROSTATE N/A 02/14/2020   Procedure: TRANSURETHRAL RESECTION OF THE PROSTATE (TURP), BIPOLAR;  Surgeon: Janith Lima, MD;  Location: [facility];  Service: Urology;  Laterality: N/A;   Patient Active Problem List   Diagnosis Date Noted   Blurry vision, left eye 09/20/2021   Erectile dysfunction 08/15/2021   Lightheadedness 08/10/2021   Varicose veins of left lower extremity 08/10/2021   Arthritis of carpometacarpal (CMC) joint of left thumb 07/13/2021   Nail dystrophy 06/08/2021   Thoracic back pain 05/02/2021   Thumb pain, left 01/15/2021   Sprain of tibiofibular ligament of left ankle 01/15/2021   BPPV (benign paroxysmal positional vertigo), bilateral 10/23/2020   Superficial vein thrombosis 08/08/2020   Seasonal allergies 07/06/2020   GERD (gastroesophageal reflux disease) 06/13/2020   S/P TURP 03/22/2020   Healthcare maintenance 05/25/2019   Prediabetes 04/14/2019   Episodic tension-type headache, not  intractable 09/30/2015   Hypertension 10/31/2014   Onychomycosis 12/11/2011   HIV disease ([place]) 12/28/2007   Osteoarthritis 12/28/2007    REFERRING DIAG: M54.50 (ICD-10-CM) - Acute right-sided low back pain, unspecified whether sciatica present  THERAPY DIAG:  Other low back pain  Cramp and spasm  Rationale for Evaluation and Treatment Rehabilitation  SUBJECTIVE:                                                                                                                                                                                            SUBJECTIVE STATEMENT:  This week the R mid back area has not been too bad, a 4/10. It's a little aggravated today after working overhead yesterday. The exs and tennis ball massage have been helpful.   PAIN:  Are you having pain? Yes: NPRS scale: 6/10 Pain location: R mid and low back Pain description: throbbing Aggravating factors: Work related to use with the R arm Relieving factors: muscle relaxor, hot shower, voltaren cream 3-7/10  PERTINENT HISTORY:  Arthritis, HIV   PRECAUTIONS: Other: HIV   PATIENT GOALS To know what I can to do to get rid of the pain    OBJECTIVE: (objective measures completed at initial evaluation unless otherwise dated)   DIAGNOSTIC FINDINGS:  Xray 2013: IMPRESSION:  Chronic degenerative disc changes at L5-S1.  No acute bony  abnormality.    PATIENT SURVEYS:  Modified Oswestry 18%- minimal disability    SCREENING FOR RED FLAGS: Bowel or bladder incontinence: No   COGNITION:           Overall cognitive status: Within functional limits for tasks assessed                          SENSATION: WFL   MUSCLE LENGTH: Hamstrings: Right WNLs deg; Left WNLs deg Marcello Moores test: Right WNLs deg; Left WNLs deg   POSTURE: rounded shoulders and forward head   PALPATION: TTP R paraspinals T6-T10 with increased muscle tension, R>L   LUMBAR ROM:    Active  A/PROM  eval  Flexion Full no pain  Extension Min  limited, increase in pain R  Right lateral flexion Full, increase in pain R   Left lateral flexion Full, pulling pain R  Right rotation Full, pulling pain R  Left rotation Full, pulling pain R   (Blank rows = not tested)   LOWER EXTREMITY ROM:                          Grossly WNLs Active  Right eval Left eval  Hip flexion      Hip extension      Hip abduction      Hip adduction      Hip internal rotation      Hip external rotation      Knee flexion      Knee extension      Ankle dorsiflexion      Ankle plantarflexion      Ankle inversion      Ankle eversion       (Blank rows = not tested)   LOWER EXTREMITY MMT:                         Grossly WNLs MMT Right eval Left eval  Hip flexion      Hip extension      Hip abduction      Hip adduction      Hip internal rotation      Hip external rotation      Knee flexion      Knee extension      Ankle dorsiflexion      Ankle plantarflexion      Ankle inversion      Ankle eversion       (Blank rows = not tested)   LUMBAR SPECIAL TESTS:  Straight leg raise test: Negative and Slump test: Negative   FUNCTIONAL TESTS:  NT   GAIT: Distance walked: 284f Assistive device  utilized: None Level of assistance: Complete Independence Comments: WNLs   TODAY'S TREATMENT  [facility] Adult PT Treatment:                                                DATE: 10/03/21 Therapeutic Exercise: Standing Thoracic Open Book at [place]  10 reps - 3 hold Standing 'L' Stretch at Counter  forward and laterally 3 reps - 20 hold Shoulder row 2x10 c GTB Updated HEP Manual Therapy: STM and MTPR to the R T8-T10 paraspinals Skilled palpation to ID taut muscle bands and TrPs  Trigger Point Dry Needling Treatment: Pre-treatment instruction: Patient instructed on dry needling rationale, procedures, and possible side effects including pain during treatment (achy,cramping feeling), bruising, drop of blood, lightheadedness, nausea, sweating. Patient Consent  Given: Yes Education handout provided: Yes Muscles treated: thoracic R T8-T10 paraspinals  and multifidi Needle size and number: .30x110m x 1  for multifidi .30x521mx 1 to shelf praspinals Electrical stimulation performed: No Parameters: N/A Treatment response/outcome: Twitch response elicited and Palpable decrease in muscle tension Post-treatment instructions: Patient instructed to expect possible mild to moderate muscle soreness later today and/or tomorrow. Patient instructed in methods to reduce muscle soreness and to continue prescribed HEP. If patient was dry needled over the lung field, patient was instructed on signs and symptoms of pneumothorax and, however unlikely, to see immediate medical attention should they occur. Patient was also educated on signs and symptoms of infection and to seek medical attention should they occur. Patient verbalized understanding of these instructions and education.   [facility]                                                [facility]: [facility]21 Therapeutic Exercise: Standing Thoracic Open Book at [place] reps - 3 hold Standing 'L' Stretch at Counter  forward and laterally 3 reps - 20 hold   Self Care: Tennis ball/wall massage     PATIENT EDUCATION:  Education details: Eval findings, POC, HEP, self care Person educated: Patient Education method: Explanation, Demonstration, Tactile cues, and Verbal cues Education comprehension: verbalized understanding, returned demonstration, verbal cues required, tactile cues required, and needs further education     HOME EXERCISE PROGRAM: Access Code: [email or web address] URL: [email or web address] Date: 10/03/2021 Prepared by: AlGar PontoExercises - Standing Thoracic Open Book at [place]- 2 x daily - 7 x weekly - 1 sets - 10 reps - 3 hold - Standing 'L' Stretch at Counter  - 2 x daily - 7 x weekly - 1 sets - 3 reps - 20 hold - Standing Shoulder Row with Anchored Resistance  - 1 x daily - 7 x  weekly - 3 sets - 10 reps - 2 hold   ASSESSMENT:   CLINICAL IMPRESSION: PT was completed for manual therapy including STM and MTPR f/b TPDN. Twitches and decreased muscle tesion was palpated. Therex was then completed for stretching and strengthening. Pt tolerated the session without adverse effects. Will assess pt's full response to TPDN the next session.   OBJECTIVE IMPAIRMENTS decreased ROM, increased fascial restrictions, impaired perceived functional ability, impaired UE functional use, and pain.    ACTIVITY LIMITATIONS carrying, lifting, bending, squatting, and reach over head  PARTICIPATION LIMITATIONS: cleaning, laundry, and occupation   PERSONAL FACTORS Age, Fitness, Past/current experiences, Time since onset of injury/illness/exacerbation, and 1 comorbidity: OA  are also affecting patient's functional outcome.     GOALS:   SHORT TERM GOALS: Target date: 10/17/2021   Pt will be Ind in an initial HEP Baseline: Goal status: INITIAL   2.  Pt will voice understanding of measures to assist in pain reduction  Baseline:  Goal status: INITIAL   LONG TERM GOALS: Target date: 11/16/21   Pt will be Ind in a final HEP to maintain achieved LOF  Baseline:  Goal status: INITIAL   2.  Decrease pain to 4/10 or less with daily and work related activiity Baseline: 3-7/10 Goal status: INITIAL   3.  Pt's Mod Oswestrywill improve to 12% or less as indication of improved function with less pain Baseline: 18% Goal status: INITIAL     PLAN: PT FREQUENCY: 1x/week   PT DURATION: 6 weeks   PLANNED INTERVENTIONS: Therapeutic exercises, Therapeutic activity, Patient/Family education, Self Care, Joint mobilization, Dry Needling, Electrical stimulation, Spinal manipulation, Spinal mobilization, Cryotherapy, Moist heat, Taping, Traction, Ionotophoresis '4mg'$ /ml Dexamethasone, Manual therapy, and Re-evaluation.   PLAN FOR NEXT SESSION: Review Mod Oswestry; assess response to HEP; progress  therex as indicated; use of modalities, manual therapy; and TPDN as indicated.     Matalyn Nawaz MS, PT 10/03/21 9:50 PM

## 2021-10-03 ENCOUNTER — Ambulatory Visit: Payer: Medicaid Other

## 2021-10-03 DIAGNOSIS — M5459 Other low back pain: Secondary | ICD-10-CM | POA: Diagnosis not present

## 2021-10-03 DIAGNOSIS — R252 Cramp and spasm: Secondary | ICD-10-CM | POA: Diagnosis not present

## 2021-10-03 DIAGNOSIS — M545 Low back pain, unspecified: Secondary | ICD-10-CM | POA: Diagnosis not present

## 2021-10-05 DIAGNOSIS — Z419 Encounter for procedure for purposes other than remedying health state, unspecified: Secondary | ICD-10-CM | POA: Diagnosis not present

## 2021-10-10 ENCOUNTER — Ambulatory Visit: Payer: Medicaid Other | Attending: Internal Medicine

## 2021-10-10 DIAGNOSIS — R252 Cramp and spasm: Secondary | ICD-10-CM | POA: Insufficient documentation

## 2021-10-10 DIAGNOSIS — M5459 Other low back pain: Secondary | ICD-10-CM | POA: Insufficient documentation

## 2021-10-10 NOTE — Therapy (Signed)
OUTPATIENT PHYSICAL THERAPY TREATMENT NOTE   Patient Name: Chad Little MRN: [id number] DOB:1960/04/24, 61 y.o., male Today's Date: 10/10/2021  PCP: Serita Butcher, MD REFERRING PROVIDER: Lottie Mussel, MD  END OF SESSION:   PT End of Session - 10/10/21 0935     Visit Number 3    Number of Visits 7    Date for PT Re-Evaluation 11/16/21    Authorization Type [place] MEDICAID [facility]    PT Start Time 0934    PT Stop Time 1018    PT Time Calculation (min) 44 min    Activity Tolerance Patient tolerated treatment well    Behavior During Therapy [facility] for tasks assessed/performed              Past Medical History:  Diagnosis Date   Arthritis    Enlarged prostate    Essential hypertension    GERD (gastroesophageal reflux disease)    Gout    diet controlled no recent flare ups   HIV infection [facility])    Past Surgical History:  Procedure Laterality Date   APPENDECTOMY  2005   open   Right elbow surgery  dec3 2020   in [place]   TRANSURETHRAL RESECTION OF PROSTATE N/A 02/14/2020   Procedure: TRANSURETHRAL RESECTION OF THE PROSTATE (TURP), BIPOLAR;  Surgeon: Janith Lima, MD;  Location: [facility];  Service: Urology;  Laterality: N/A;   Patient Active Problem List   Diagnosis Date Noted   Blurry vision, left eye 09/20/2021   Erectile dysfunction 08/15/2021   Lightheadedness 08/10/2021   Varicose veins of left lower extremity 08/10/2021   Arthritis of carpometacarpal (CMC) joint of left thumb 07/13/2021   Nail dystrophy 06/08/2021   Thoracic back pain 05/02/2021   Thumb pain, left 01/15/2021   Sprain of tibiofibular ligament of left ankle 01/15/2021   BPPV (benign paroxysmal positional vertigo), bilateral 10/23/2020   Superficial vein thrombosis 08/08/2020   Seasonal allergies 07/06/2020   GERD (gastroesophageal reflux disease) 06/13/2020   S/P TURP 03/22/2020   Healthcare maintenance 05/25/2019   Prediabetes 04/14/2019   Episodic tension-type  headache, not intractable 09/30/2015   Hypertension 10/31/2014   Onychomycosis 12/11/2011   HIV disease ([place]) 12/28/2007   Osteoarthritis 12/28/2007    REFERRING DIAG: M54.50 (ICD-10-CM) - Acute right-sided low back pain, unspecified whether sciatica present  THERAPY DIAG:  Other low back pain  Cramp and spasm  Rationale for Evaluation and Treatment Rehabilitation  SUBJECTIVE:  SUBJECTIVE STATEMENT: Pt reports his R mid back pain has been improved. Pt notes he has not been working as much.    PAIN:  sine the last PT session Are you having pain? Yes: NPRS scale: 2/10 Pain location: R mid and low back Pain description: throbbing Aggravating factors: Work related to use with the R arm Relieving factors: muscle relaxor, hot shower, voltaren cream 2-4/10  PERTINENT HISTORY:  Arthritis, HIV   PRECAUTIONS: Other: HIV   PATIENT GOALS To know what I can to do to get rid of the pain    OBJECTIVE: (objective measures completed at initial evaluation unless otherwise dated)   DIAGNOSTIC FINDINGS:  Xray 2013: IMPRESSION:  Chronic degenerative disc changes at L5-S1.  No acute bony  abnormality.    PATIENT SURVEYS:  Modified Oswestry 18%- minimal disability    SCREENING FOR RED FLAGS: Bowel or bladder incontinence: No   COGNITION:           Overall cognitive status: Within functional limits for tasks assessed                          SENSATION: WFL   MUSCLE LENGTH: Hamstrings: Right WNLs deg; Left WNLs deg Marcello Moores test: Right WNLs deg; Left WNLs deg   POSTURE: rounded shoulders and forward head   PALPATION: TTP R paraspinals T6-T10 with increased muscle tension, R>L   LUMBAR ROM:    Active  A/PROM  eval  Flexion Full no pain  Extension Min limited, increase in pain R  Right lateral  flexion Full, increase in pain R   Left lateral flexion Full, pulling pain R  Right rotation Full, pulling pain R  Left rotation Full, pulling pain R   (Blank rows = not tested)   LOWER EXTREMITY ROM:                          Grossly WNLs Active  Right eval Left eval  Hip flexion      Hip extension      Hip abduction      Hip adduction      Hip internal rotation      Hip external rotation      Knee flexion      Knee extension      Ankle dorsiflexion      Ankle plantarflexion      Ankle inversion      Ankle eversion       (Blank rows = not tested)   LOWER EXTREMITY MMT:                         Grossly WNLs MMT Right eval Left eval  Hip flexion      Hip extension      Hip abduction      Hip adduction      Hip internal rotation      Hip external rotation      Knee flexion      Knee extension      Ankle dorsiflexion      Ankle plantarflexion      Ankle inversion      Ankle eversion       (Blank rows = not tested)   LUMBAR SPECIAL TESTS:  Straight leg raise test: Negative and Slump test: Negative   FUNCTIONAL TESTS:  NT   GAIT: Distance walked: 262f Assistive device utilized: None Level of assistance:  Complete Independence Comments: WNLs   TODAY'S TREATMENT  [facility] Adult PT Treatment:                                                DATE: 10/10/21 Therapeutic Exercise: Cat/Camel 5x 10" Thoracic ext c soft foam roller with high back chair and supine for massage and spinal mobility Standing Thoracic Open Book at Wall  10 reps - 3 hold 90d pectoral stretch 3x30" Shoulder row x20 c BluTB Shoulder ext x20 c BluTB Updated HEP Manual Therapy: STM and MTPR to the R T8-T10 paraspinals. Decreased muscle tension noted c palpation Skilled palpation to ID taut muscle bands and TrPs  Trigger Point Dry Needling Treatment: Pre-treatment instruction: Patient instructed on dry needling rationale, procedures, and possible side effects including pain during treatment  (achy,cramping feeling), bruising, drop of blood, lightheadedness, nausea, sweating. Patient Consent Given: Yes Education handout provided: Yes Muscles treated: thoracic R T8-T10 paraspinals Needle size and number: 30x25m x 1 to shelf paraspinals Electrical stimulation performed: No Parameters: N/A Treatment response/outcome: Twitch response elicited and Palpable decrease in muscle tension Post-treatment instructions: Patient instructed to expect possible mild to moderate muscle soreness later today and/or tomorrow. Patient instructed in methods to reduce muscle soreness and to continue prescribed HEP. If patient was dry needled over the lung field, patient was instructed on signs and symptoms of pneumothorax and, however unlikely, to see immediate medical attention should they occur. Patient was also educated on signs and symptoms of infection and to seek medical attention should they occur. Patient verbalized understanding of these instructions and education.   [facility]                                                [facility]: [facility]21 Therapeutic Exercise: Standing Thoracic Open Book at [place] 10 reps - 3 hold Standing 'L' Stretch at Counter  forward and laterally 3 reps - 20 hold Shoulder row 2x10 c GTB Updated HEP Manual Therapy: STM and MTPR to the R T8-T10 paraspinals.  Skilled palpation to ID taut muscle bands and TrPs  Trigger Point Dry Needling Treatment: Pre-treatment instruction: Patient instructed on dry needling rationale, procedures, and possible side effects including pain during treatment (achy,cramping feeling), bruising, drop of blood, lightheadedness, nausea, sweating. Patient Consent Given: Yes Education handout provided: Yes Muscles treated: thoracic R T8-T10 paraspinals  and multifidi Needle size and number: .30x719mx 1  for multifidi .30x5022m 1 to shelf praspinals Electrical stimulation performed: No Parameters: N/A Treatment response/outcome: Twitch  response elicited and Palpable decrease in muscle tension Post-treatment instructions: Patient instructed to expect possible mild to moderate muscle soreness later today and/or tomorrow. Patient instructed in methods to reduce muscle soreness and to continue prescribed HEP. If patient was dry needled over the lung field, patient was instructed on signs and symptoms of pneumothorax and, however unlikely, to see immediate medical attention should they occur. Patient was also educated on signs and symptoms of infection and to seek medical attention should they occur. Patient verbalized understanding of these instructions and education.   [facility]  [facility]: 09/25/21 Therapeutic Exercise: Standing Thoracic Open Book at [place]  10 reps - 3 hold Standing 'L' Stretch at Counter  forward and laterally 3 reps - 20 hold   Self Care: Tennis ball/wall massage     PATIENT EDUCATION:  Education details: Eval findings, POC, HEP, self care Person educated: Patient Education method: Explanation, Demonstration, Tactile cues, and Verbal cues Education comprehension: verbalized understanding, returned demonstration, verbal cues required, tactile cues required, and needs further education     HOME EXERCISE PROGRAM: Access Code: [email or web address] URL: [email or web address] Date: 10/10/2021 Prepared by: Gar Ponto  Exercises - Standing Thoracic Open Book at [place]  - 2 x daily - 7 x weekly - 1 sets - 10 reps - 3 hold - Standing 'L' Stretch at Counter  - 2 x daily - 7 x weekly - 1 sets - 3 reps - 20 hold - Standing Shoulder Row with Anchored Resistance  - 1 x daily - 7 x weekly - 3 sets - 10 reps - 2 hold - Shoulder extension with resistance - Neutral  - 1 x daily - 7 x weekly - 3 sets - 10 reps - 2 hold - Cat Cow  - 2 x daily - 7 x weekly - 1 sets - 3 reps - 20 hold - Doorway Pec Stretch at 90 Degrees Abduction  - 1 x daily - 7 x weekly - 1 sets - 3  reps - 30 hold   ASSESSMENT:   CLINICAL IMPRESSION: Pt is responding to PT positively with a decrease in his R mid back pain and noted decreased muscle tension today. Pt reports consistent completion of his HEP and demonstrates proper technique. Additionally, pt reports using the tennis ball at home to massage the R mid back. Pt was completed today for manual therapy to address mid back pain f/b TPDN and therex for ROM/mobility and strengthening. Pt tolerated the session and and will continue to benefit from skilled pt to address deficits to optimize function c less pain.    OBJECTIVE IMPAIRMENTS decreased ROM, increased fascial restrictions, impaired perceived functional ability, impaired UE functional use, and pain.    ACTIVITY LIMITATIONS carrying, lifting, bending, squatting, and reach over head   PARTICIPATION LIMITATIONS: cleaning, laundry, and occupation   PERSONAL FACTORS Age, Fitness, Past/current experiences, Time since onset of injury/illness/exacerbation, and 1 comorbidity: OA  are also affecting patient's functional outcome.     GOALS:   SHORT TERM GOALS: Target date: 10/17/2021   Pt will be Ind in an initial HEP Baseline: Goal status: MET   2.  Pt will voice understanding of measures to assist in pain reduction  Baseline:  Goal status: MET   LONG TERM GOALS: Target date: 11/16/21   Pt will be Ind in a final HEP to maintain achieved LOF  Baseline:  Goal status: INITIAL   2.  Decrease pain to 4/10 or less with daily and work related activiity Baseline: 3-7/10 Goal status: INITIAL   3.  Pt's Mod Oswestrywill improve to 12% or less as indication of improved function with less pain Baseline: 18% Goal status: INITIAL     PLAN: PT FREQUENCY: 1x/week   PT DURATION: 6 weeks   PLANNED INTERVENTIONS: Therapeutic exercises, Therapeutic activity, Patient/Family education, Self Care, Joint mobilization, Dry Needling, Electrical stimulation, Spinal manipulation, Spinal  mobilization, Cryotherapy, Moist heat, Taping, Traction, Ionotophoresis 41m/ml Dexamethasone, Manual therapy, and Re-evaluation.   PLAN FOR NEXT SESSION: Review Mod Oswestry; assess response to HEP; progress therex as indicated; use of modalities, manual  therapy; and TPDN as indicated.     Gabrille Kilbride MS, PT 10/10/21 2:24 PM

## 2021-10-16 NOTE — Therapy (Signed)
OUTPATIENT PHYSICAL THERAPY TREATMENT NOTE/Discharge   Patient Name: Chad Little MRN: [id number] DOB:09-03-1960, 61 y.o., male Today's Date: 10/17/2021  PCP: Serita Butcher, MD REFERRING PROVIDER: Lottie Mussel, MD  END OF SESSION:   PT End of Session - 10/17/21 0958     Visit Number 4    Number of Visits 7    Date for PT Re-Evaluation 11/16/21    Authorization Type [place] MEDICAID [facility]    PT Start Time 0945    PT Stop Time 1015    PT Time Calculation (min) 30 min    Activity Tolerance Patient tolerated treatment well    Behavior During Therapy [facility] for tasks assessed/performed               Past Medical History:  Diagnosis Date   Arthritis    Enlarged prostate    Essential hypertension    GERD (gastroesophageal reflux disease)    Gout    diet controlled no recent flare ups   HIV infection [facility])    Past Surgical History:  Procedure Laterality Date   APPENDECTOMY  2005   open   Right elbow surgery  dec3 2020   in [place]   TRANSURETHRAL RESECTION OF PROSTATE N/A 02/14/2020   Procedure: TRANSURETHRAL RESECTION OF THE PROSTATE (TURP), BIPOLAR;  Surgeon: Janith Lima, MD;  Location: [facility];  Service: Urology;  Laterality: N/A;   Patient Active Problem List   Diagnosis Date Noted   Blurry vision, left eye 09/20/2021   Erectile dysfunction 08/15/2021   Lightheadedness 08/10/2021   Varicose veins of left lower extremity 08/10/2021   Arthritis of carpometacarpal (CMC) joint of left thumb 07/13/2021   Nail dystrophy 06/08/2021   Thoracic back pain 05/02/2021   Thumb pain, left 01/15/2021   Sprain of tibiofibular ligament of left ankle 01/15/2021   BPPV (benign paroxysmal positional vertigo), bilateral 10/23/2020   Superficial vein thrombosis 08/08/2020   Seasonal allergies 07/06/2020   GERD (gastroesophageal reflux disease) 06/13/2020   S/P TURP 03/22/2020   Healthcare maintenance 05/25/2019   Prediabetes 04/14/2019   Episodic  tension-type headache, not intractable 09/30/2015   Hypertension 10/31/2014   Onychomycosis 12/11/2011   HIV disease ([place]) 12/28/2007   Osteoarthritis 12/28/2007    REFERRING DIAG: M54.50 (ICD-10-CM) - Acute right-sided low back pain, unspecified whether sciatica present  THERAPY DIAG:  Other low back pain  Cramp and spasm  Rationale for Evaluation and Treatment Rehabilitation  SUBJECTIVE:  SUBJECTIVE STATEMENT: Pt reports his R mid back pain has continued to improve. He notes he has been working and and the pain has been low. Pt reports he is very pleased with his progress, his HEP, and measures to assist him with managing his pain and is ready for DC.   PAIN:   Are you having pain? Yes: NPRS scale: 2/10 Pain location: R mid and low back Pain description: throbbing Aggravating factors: Work related to use with the R arm Relieving factors: muscle relaxor, hot shower, voltaren cream 0-4/10  PERTINENT HISTORY:  Arthritis, HIV   PRECAUTIONS: Other: HIV   PATIENT GOALS To know what I can to do to get rid of the pain    OBJECTIVE: (objective measures completed at initial evaluation unless otherwise dated)   DIAGNOSTIC FINDINGS:  Xray 2013: IMPRESSION:  Chronic degenerative disc changes at L5-S1.  No acute bony  abnormality.    PATIENT SURVEYS:  Modified Oswestry 18%- minimal disability  10/17/21= 10%   SCREENING FOR RED FLAGS: Bowel or bladder incontinence: No   COGNITION:           Overall cognitive status: Within functional limits for tasks assessed                          SENSATION: WFL   MUSCLE LENGTH: Hamstrings: Right WNLs deg; Left WNLs deg Marcello Moores test: Right WNLs deg; Left WNLs deg   POSTURE: rounded shoulders and forward head   PALPATION: TTP R paraspinals T6-T10 with  increased muscle tension, R>L   LUMBAR ROM:    Active  A/PROM  eval  Flexion Full no pain  Extension Min limited, increase in pain R  Right lateral flexion Full, increase in pain R   Left lateral flexion Full, pulling pain R  Right rotation Full, pulling pain R  Left rotation Full, pulling pain R   (Blank rows = not tested)   LOWER EXTREMITY ROM:                          Grossly WNLs Active  Right eval Left eval  Hip flexion      Hip extension      Hip abduction      Hip adduction      Hip internal rotation      Hip external rotation      Knee flexion      Knee extension      Ankle dorsiflexion      Ankle plantarflexion      Ankle inversion      Ankle eversion       (Blank rows = not tested)   LOWER EXTREMITY MMT:                         Grossly WNLs MMT Right eval Left eval  Hip flexion      Hip extension      Hip abduction      Hip adduction      Hip internal rotation      Hip external rotation      Knee flexion      Knee extension      Ankle dorsiflexion      Ankle plantarflexion      Ankle inversion      Ankle eversion       (Blank rows = not tested)   LUMBAR SPECIAL TESTS:  Straight  leg raise test: Negative and Slump test: Negative   FUNCTIONAL TESTS:  NT   GAIT: Distance walked: 231f Assistive device utilized: None Level of assistance: Complete Independence Comments: WNLs   TODAY'S TREATMENT  [facility] Adult PT Treatment:                                                DATE: 10/17/21 Therapeutic Exercise: Cat/Camel 5x 10" Standing Thoracic Open Book at [place] 10 reps - 3 hold Standing 'L' Stretch at Counter  forward and laterally 3 reps - 20 hold 90d pectoral stretch 3x30" Shoulder row x20 c BluTB Shoulder ext x20 c BluTB Final HEP Self Care: Mod oswestry- completion and review  [facility] Adult PT Treatment:                                                DATE: 10/10/21 Therapeutic Exercise: Cat/Camel 5x 10" Thoracic ext c soft foam roller with  high back chair and supine for massage and spinal mobility Standing Thoracic Open Book at Wall  10 reps - 3 hold 90d pectoral stretch 3x30" Shoulder row x20 c BluTB Shoulder ext x20 c BluTB Updated HEP Manual Therapy: STM and MTPR to the R T8-T10 paraspinals. Decreased muscle tension noted c palpation Skilled palpation to ID taut muscle bands and TrPs  Trigger Point Dry Needling Treatment: Pre-treatment instruction: Patient instructed on dry needling rationale, procedures, and possible side effects including pain during treatment (achy,cramping feeling), bruising, drop of blood, lightheadedness, nausea, sweating. Patient Consent Given: Yes Education handout provided: Yes Muscles treated: thoracic R T8-T10 paraspinals Needle size and number: 30x516mx 1 to shelf paraspinals Electrical stimulation performed: No Parameters: N/A Treatment response/outcome: Twitch response elicited and Palpable decrease in muscle tension Post-treatment instructions: Patient instructed to expect possible mild to moderate muscle soreness later today and/or tomorrow. Patient instructed in methods to reduce muscle soreness and to continue prescribed HEP. If patient was dry needled over the lung field, patient was instructed on signs and symptoms of pneumothorax and, however unlikely, to see immediate medical attention should they occur. Patient was also educated on signs and symptoms of infection and to seek medical attention should they occur. Patient verbalized understanding of these instructions and education.   [facility]                                                DATE: 10/[facility]/21 Therapeutic Exercise: Standing Thoracic Open Book at [place] reps - 3 hold Standing 'L' Stretch at Counter  forward and laterally 3 reps - 20 hold Shoulder row 2x10 c GTB Updated HEP Manual Therapy: STM and MTPR to the R T8-T10 paraspinals.  Skilled palpation to ID taut muscle bands and TrPs  Trigger Point Dry  Needling Treatment: Pre-treatment instruction: Patient instructed on dry needling rationale, procedures, and possible side effects including pain during treatment (achy,cramping feeling), bruising, drop of blood, lightheadedness, nausea, sweating. Patient Consent Given: Yes Education handout provided: Yes Muscles treated: thoracic R T8-T10 paraspinals  and multifidi Needle size and number: .30x7556m 1  for multifidi .  30x77m x 1 to shelf praspinals Electrical stimulation performed: No Parameters: N/A Treatment response/outcome: Twitch response elicited and Palpable decrease in muscle tension Post-treatment instructions: Patient instructed to expect possible mild to moderate muscle soreness later today and/or tomorrow. Patient instructed in methods to reduce muscle soreness and to continue prescribed HEP. If patient was dry needled over the lung field, patient was instructed on signs and symptoms of pneumothorax and, however unlikely, to see immediate medical attention should they occur. Patient was also educated on signs and symptoms of infection and to seek medical attention should they occur. Patient verbalized understanding of these instructions and education.      PATIENT EDUCATION:  Education details: Eval findings, POC, HEP, self care Person educated: Patient Education method: Explanation, Demonstration, Tactile cues, and Verbal cues Education comprehension: verbalized understanding, returned demonstration, verbal cues required, tactile cues required, and needs further education     HOME EXERCISE PROGRAM: Access Code: [email or web address] URL: [email or web address] Date: 10/10/2021 Prepared by: AGar Ponto Exercises - Standing Thoracic Open Book at [place] - 2 x daily - 7 x weekly - 1 sets - 10 reps - 3 hold - Standing 'L' Stretch at Counter  - 2 x daily - 7 x weekly - 1 sets - 3 reps - 20 hold - Standing Shoulder Row with Anchored Resistance  - 1 x daily - 7 x weekly - 3 sets -  10 reps - 2 hold - Shoulder extension with resistance - Neutral  - 1 x daily - 7 x weekly - 3 sets - 10 reps - 2 hold - Cat Cow  - 2 x daily - 7 x weekly - 1 sets - 3 reps - 20 hold - Doorway Pec Stretch at 90 Degrees Abduction  - 1 x daily - 7 x weekly - 1 sets - 3 reps - 30 hold   ASSESSMENT:   CLINICAL IMPRESSION: Pt has made very good progress re: his R mid back pain. 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 Pt reports he has the means to manage the pain on his own and would like to DC PT at this time. Pt is DC with all PT goals met.   OBJECTIVE IMPAIRMENTS decreased ROM, increased fascial restrictions, impaired perceived functional ability, impaired UE functional use, and pain.    ACTIVITY LIMITATIONS carrying, lifting, bending, squatting, and reach over head   PARTICIPATION LIMITATIONS: cleaning, laundry, and occupation   PERSONAL FACTORS Age, Fitness, Past/current experiences, Time since onset of injury/illness/exacerbation, and 1 comorbidity: OA  are also affecting patient's functional outcome.     GOALS:   SHORT TERM GOALS: Target date: 10/17/2021   Pt will be Ind in an initial HEP Baseline: Goal status: MET   2.  Pt will voice understanding of measures to assist in pain reduction  Baseline:  Goal status: MET   LONG TERM GOALS: Target date: 11/16/21   Pt will be Ind in a final HEP to maintain achieved LOF  Baseline:  Goal status: MET   2.  Decrease pain to 4/10 or less with daily and work related activiity Baseline: 3-7/10 Goal status: MET   3.  Pt's Mod Oswestrywill improve to 12% or less as indication of improved function with less pain Baseline: 18% Goal status: MET   PLAN: PT FREQUENCY: 1x/week   PT DURATION: 6 weeks   PLANNED INTERVENTIONS: Therapeutic exercises, Therapeutic activity, Patient/Family education, Self  Care, Joint mobilization, Dry Needling, Electrical  stimulation, Spinal manipulation, Spinal mobilization, Cryotherapy, Moist heat, Taping, Traction, Ionotophoresis 67m/ml Dexamethasone, Manual therapy, and Re-evaluation.   PLAN FOR NEXT SESSION:    AGar PontoMS, PT 10/17/21 11:41 AM

## 2021-10-17 ENCOUNTER — Ambulatory Visit: Payer: Medicaid Other

## 2021-10-17 DIAGNOSIS — M5459 Other low back pain: Secondary | ICD-10-CM

## 2021-10-17 DIAGNOSIS — R252 Cramp and spasm: Secondary | ICD-10-CM

## 2021-10-23 ENCOUNTER — Other Ambulatory Visit: Payer: Self-pay

## 2021-10-23 ENCOUNTER — Other Ambulatory Visit: Payer: Medicaid Other

## 2021-10-23 DIAGNOSIS — R7303 Prediabetes: Secondary | ICD-10-CM | POA: Diagnosis not present

## 2021-10-23 DIAGNOSIS — B20 Human immunodeficiency virus [HIV] disease: Secondary | ICD-10-CM

## 2021-10-24 LAB — T-HELPER CELL (CD4) - (RCID CLINIC ONLY)
CD4 % Helper T Cell: 36 % (ref 33–65)
CD4 T Cell Abs: 600 /uL (ref 400–1790)

## 2021-10-25 LAB — COMPREHENSIVE METABOLIC PANEL
AG Ratio: 1.4 (calc) (ref 1.0–2.5)
ALT: 21 U/L (ref 9–46)
AST: 25 U/L (ref 10–35)
Albumin: 4.4 g/dL (ref 3.6–5.1)
Alkaline phosphatase (APISO): 103 U/L (ref 35–144)
BUN: 13 mg/dL (ref 7–25)
CO2: 25 mmol/L (ref 20–32)
Calcium: 9.6 mg/dL (ref 8.6–10.3)
Chloride: 104 mmol/L (ref 98–110)
Creat: 1.24 mg/dL (ref 0.70–1.35)
Globulin: 3.2 g/dL (calc) (ref 1.9–3.7)
Glucose, Bld: 104 mg/dL — ABNORMAL HIGH (ref 65–99)
Potassium: 4.2 mmol/L (ref 3.5–5.3)
Sodium: 139 mmol/L (ref 135–146)
Total Bilirubin: 0.5 mg/dL (ref 0.2–1.2)
Total Protein: 7.6 g/dL (ref 6.1–8.1)

## 2021-10-25 LAB — HEMOGLOBIN A1C
Hgb A1c MFr Bld: 6 % of total Hgb — ABNORMAL HIGH (ref <5.7)
Mean Plasma Glucose: 126 mg/dL
eAG (mmol/L): 7 mmol/L

## 2021-10-25 LAB — HIV-1 RNA QUANT-NO REFLEX-BLD
HIV 1 RNA Quant: NOT DETECTED Copies/mL
HIV-1 RNA Quant, Log: NOT DETECTED Log cps/mL

## 2021-10-30 ENCOUNTER — Ambulatory Visit: Payer: Medicaid Other | Admitting: Sports Medicine

## 2021-10-30 VITALS — BP 130/76 | Ht 77.0 in

## 2021-10-30 DIAGNOSIS — M79645 Pain in left finger(s): Secondary | ICD-10-CM | POA: Diagnosis not present

## 2021-10-30 MED ORDER — METHYLPREDNISOLONE ACETATE 40 MG/ML IJ SUSP
20.0000 mg | Freq: Once | INTRAMUSCULAR | Status: AC
  Administered 2021-10-30: 20 mg via INTRA_ARTICULAR

## 2021-10-30 NOTE — Progress Notes (Signed)
   Subjective:    Patient ID: Chad Little, male    DOB: 08-29-1960, 61 y.o.   MRN: [id number]  HPI chief complaint: Left thumb pain  Chad Little presents today requesting repeat injection into the left MCP joint of his thumb.  This joint was last injected in June with good results.  Although he also has [place] osteoarthritis, his pain today is at the MCP joint.  No recent trauma.    Review of Systems As above    Objective:   Physical Exam  Well-developed, well-nourished.  No acute distress  Left thumb: Good range of motion.  No effusion.  No soft tissue swelling.  He is tender to palpation at the MCP joint of the thumb and minimal tenderness at the [facility] joint.  Negative CMC grind.  Good pulses.  No triggering.       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  He tolerates this without difficulty.  This is his third thumb injection in the past year so I did discuss with Chad Little the possibility of meeting with one of the hand surgeons in the near future if his symptoms do not continue to respond to cortisone injections.  He will follow-up as needed.  Consent obtained and verified. Time-out conducted. Noted no overlying erythema, induration, or other signs of local infection. Skin prepped in a sterile fashion. Topical analgesic spray: Ethyl chloride. Joint: Left first MCP Needle: 5/8 inch 25-gauge Completed without difficulty. Meds: 0.5 cc 1% Xylocaine, 0.5 cc (20 mg) Depo-Medrol  Advised to call if fevers/chills, erythema, induration, drainage, or persistent bleeding.   This note was dictated using Dragon naturally speaking software and may contain errors in syntax, spelling, or content which have not been identified prior to signing this note.

## 2021-10-31 ENCOUNTER — Other Ambulatory Visit: Payer: Self-pay

## 2021-10-31 DIAGNOSIS — J302 Other seasonal allergic rhinitis: Secondary | ICD-10-CM

## 2021-10-31 MED ORDER — FLUTICASONE PROPIONATE 50 MCG/ACT NA SUSP: 1.0000 | Freq: Every day | NASAL | 3 refills | Status: DC

## 2021-10-31 NOTE — Telephone Encounter (Signed)
Requesting refill on fluticasone (FLONASE) 50 MCG/ACT nasal spray.

## 2021-11-04 ENCOUNTER — Other Ambulatory Visit: Payer: Self-pay | Admitting: Family

## 2021-11-04 DIAGNOSIS — B20 Human immunodeficiency virus [HIV] disease: Secondary | ICD-10-CM

## 2021-11-04 DIAGNOSIS — Z419 Encounter for procedure for purposes other than remedying health state, unspecified: Secondary | ICD-10-CM | POA: Diagnosis not present

## 2021-11-06 ENCOUNTER — Other Ambulatory Visit: Payer: Self-pay

## 2021-11-06 ENCOUNTER — Ambulatory Visit: Payer: Medicaid Other | Admitting: Family

## 2021-11-06 MED ORDER — ATORVASTATIN CALCIUM 10 MG PO TABS: 10.0000 mg | ORAL_TABLET | Freq: Every day | ORAL | 11 refills | Status: DC

## 2021-11-15 ENCOUNTER — Other Ambulatory Visit: Payer: Self-pay

## 2021-11-15 ENCOUNTER — Ambulatory Visit: Payer: Medicaid Other | Admitting: Family

## 2021-11-15 ENCOUNTER — Encounter: Payer: Self-pay | Admitting: Family

## 2021-11-15 VITALS — BP 141/95 | HR 71 | Resp 16 | Ht 75.0 in | Wt 258.0 lb

## 2021-11-15 DIAGNOSIS — K219 Gastro-esophageal reflux disease without esophagitis: Secondary | ICD-10-CM | POA: Diagnosis not present

## 2021-11-15 DIAGNOSIS — I10 Essential (primary) hypertension: Secondary | ICD-10-CM

## 2021-11-15 DIAGNOSIS — B20 Human immunodeficiency virus [HIV] disease: Secondary | ICD-10-CM | POA: Diagnosis not present

## 2021-11-15 DIAGNOSIS — Z0000 Encounter for general adult medical examination without abnormal findings: Secondary | ICD-10-CM

## 2021-11-15 DIAGNOSIS — Z79899 Other long term (current) drug therapy: Secondary | ICD-10-CM

## 2021-11-15 MED ORDER — TIVICAY 50 MG PO TABS: 50.0000 mg | ORAL_TABLET | Freq: Every day | ORAL | 5 refills | Status: DC

## 2021-11-15 MED ORDER — EMTRICITABINE-TENOFOVIR AF 200-25 MG PO TABS: 1.0000 | ORAL_TABLET | Freq: Every day | ORAL | 5 refills | Status: DC

## 2021-11-15 NOTE — Assessment & Plan Note (Signed)
Blood pressure slightly elevated above goal 140/90 with current medication regimen of olmesartan-hydrochlorothiazide.  No neurological or ophthalmologic signs/symptoms.  Continue to monitor blood pressure.  Continue current dose of olmesartan-hydrochlorothiazide with changes per internal medicine.

## 2021-11-15 NOTE — Assessment & Plan Note (Signed)
   Discussed importance of safe sexual practice and condom use.  Condoms and STD testing offered.  Influenza vaccination up-to-date  Considering RSV vaccine.

## 2021-11-15 NOTE — Patient Instructions (Signed)
Nice to see you.  Continue to take your medication daily as prescribed.  Refills have been sent to the pharmacy.  Plan for follow up in 6 months or sooner if needed with lab work 1-2 weeks prior to appointment.   Have a great day and stay safe!  

## 2021-11-15 NOTE — Progress Notes (Signed)
Brief Narrative   Patient ID: Chad Little, male    DOB: 1960/03/15, 61 y.o.   MRN: [id number]  Chad Little is a 61 year old African-American male diagnosed with HIV-1 in 1998 with risk factor heterosexual contact.  Initial viral load and CD4 count unavailable.  Genotype from 2009 with no significant medication resistance patterns.  No history of opportunistic infection. [id number] negative. ART regimen of Tivicay and Descovy.   Subjective:    Chief Complaint  Patient presents with   Follow-up    HPI:  Chad Little is a 61 y.o. male with HIV disease last seen on 05/02/2021 with well-controlled virus and good adherence and tolerance to Tivicay and Descovy.  Viral load was undetectable with CD4 count of 823.  Most recent lab work completed on 10/23/2021 with viral load that remains undetectable and CD4 count of 600.  Kidney function, liver function, electrolytes within normal ranges.  Hemoglobin A1c with 6.0 indicating prediabetes which is consistent with previous and down from 6.2.  Here today for routine follow-up.  Chad Little has been doing well since his last office visit. Has been taking his Tivicay and Descovy as prescribed with no adverse side effects or problems obtaining medication from the pharmacy. Has concerns about a dry cough that has been going on at night when he lies down and is improved when he sits up. Takes his pantoprazole twice daily. Received his updated Covid vaccine about 3 weeks ago as well and questions if there is a connection. Was also in a home recently doing renovations where mold was found. Has received his influenza vaccination. Condoms and STD testing offered.  Denies fevers, chills, night sweats, headaches, changes in vision, neck pain/stiffness, nausea, diarrhea, vomiting, lesions or rashes.    No Known Allergies    Outpatient Medications Prior to Visit  Medication Sig Dispense Refill   atorvastatin (LIPITOR) 10 MG tablet Take 1 tablet (10 mg  total) by mouth daily. 30 tablet 11   fluticasone (FLONASE) 50 MCG/ACT nasal spray Place 1 spray into both nostrils daily. 11.1 g 3   methocarbamol (ROBAXIN) 500 MG tablet Take 1 tablet (500 mg total) by mouth 2 (two) times daily. 30 tablet 0   olmesartan-hydrochlorothiazide (BENICAR HCT) 40-25 MG tablet Take 1 tablet by mouth daily. 90 tablet 3   pantoprazole (PROTONIX) 40 MG tablet Take 1 tablet (40 mg total) by mouth 2 (two) times daily. 180 tablet 0   sildenafil (VIAGRA) 50 MG tablet Take 1 tablet (50 mg total) by mouth as needed for erectile dysfunction. 20 tablet 1   traMADol (ULTRAM) 50 MG tablet Take 1 tablet (50 mg total) by mouth every 6 (six) hours as needed. 15 tablet 0   dolutegravir (TIVICAY) 50 MG tablet Take 1 tablet (50 mg total) by mouth daily. 30 tablet 5   emtricitabine-tenofovir AF (DESCOVY) 200-25 MG tablet Take 1 tablet by mouth daily. 30 tablet 5   No facility-administered medications prior to visit.     Past Medical History:  Diagnosis Date   Arthritis    Enlarged prostate    Essential hypertension    GERD (gastroesophageal reflux disease)    Gout    diet controlled no recent flare ups   HIV infection [facility])      Past Surgical History:  Procedure Laterality Date   APPENDECTOMY  2005   open   Right elbow surgery  dec3 2020   in [place]   TRANSURETHRAL RESECTION OF PROSTATE N/A 02/14/2020   Procedure: TRANSURETHRAL  RESECTION OF THE PROSTATE (TURP), BIPOLAR;  Surgeon: Janith Lima, MD;  Location: [facility];  Service: Urology;  Laterality: N/A;      Review of Systems  Constitutional:  Negative for appetite change, chills, fatigue, fever and unexpected weight change.  Eyes:  Negative for visual disturbance.  Respiratory:  Negative for cough, chest tightness, shortness of breath and wheezing.   Cardiovascular:  Negative for chest pain and leg swelling.  Gastrointestinal:  Negative for abdominal pain, constipation, diarrhea, nausea and  vomiting.  Genitourinary:  Negative for dysuria, flank pain, frequency, genital sores, hematuria and urgency.  Skin:  Negative for rash.  Allergic/Immunologic: Negative for immunocompromised state.  Neurological:  Negative for dizziness and headaches.      Objective:    BP (!) 141/95   Pulse 71   Resp 16   Ht '6\' 3"'$  (1.905 m)   Wt 258 lb (117 kg)   SpO2 98%   BMI 32.25 kg/m  Nursing note and vital signs reviewed.  Physical Exam Constitutional:      General: He is not in acute distress.    Appearance: He is well-developed.  Eyes:     Conjunctiva/sclera: Conjunctivae normal.  Cardiovascular:     Rate and Rhythm: Normal rate and regular rhythm.     Heart sounds: Normal heart sounds. No murmur heard.    No friction rub. No gallop.  Pulmonary:     Effort: Pulmonary effort is normal. No respiratory distress.     Breath sounds: Normal breath sounds. No wheezing or rales.  Chest:     Chest wall: No tenderness.  Abdominal:     General: Bowel sounds are normal.     Palpations: Abdomen is soft.     Tenderness: There is no abdominal tenderness.  Musculoskeletal:     Cervical back: Neck supple.  Lymphadenopathy:     Cervical: No cervical adenopathy.  Skin:    General: Skin is warm and dry.     Findings: No rash.  Neurological:     Mental Status: He is alert and oriented to person, place, and time.  Psychiatric:        Behavior: Behavior normal.        Thought Content: Thought content normal.        Judgment: Judgment normal.         11/15/2021   10:53 AM 08/28/2021    2:28 PM 08/15/2021    9:26 AM 05/02/2021    4:00 PM 03/14/2021    9:43 AM  Depression screen PHQ 2/9  Decreased Interest 0 0 0 0 0  Down, Depressed, Hopeless 0 0 0 0 0  PHQ - 2 Score 0 0 0 0 0       Assessment & Plan:    Patient Active Problem List   Diagnosis Date Noted   Blurry vision, left eye 09/20/2021   Erectile dysfunction 08/15/2021   Lightheadedness 08/10/2021   Varicose veins of left  lower extremity 08/10/2021   Arthritis of carpometacarpal (CMC) joint of left thumb 07/13/2021   Nail dystrophy 06/08/2021   Thoracic back pain 05/02/2021   Thumb pain, left 01/15/2021   Sprain of tibiofibular ligament of left ankle 01/15/2021   BPPV (benign paroxysmal positional vertigo), bilateral 10/23/2020   Superficial vein thrombosis 08/08/2020   Seasonal allergies 07/06/2020   GERD (gastroesophageal reflux disease) 06/13/2020   S/P TURP 03/22/2020   Healthcare maintenance 05/25/2019   Prediabetes 04/14/2019   Episodic tension-type headache, not intractable 09/30/2015  Hypertension 10/31/2014   Onychomycosis 12/11/2011   HIV disease ([place]) 12/28/2007   Osteoarthritis 12/28/2007     Problem List Items Addressed This Visit       Cardiovascular and Mediastinum   Hypertension (Chronic)    Blood pressure slightly elevated above goal 140/90 with current medication regimen of olmesartan-hydrochlorothiazide.  No neurological or ophthalmologic signs/symptoms.  Continue to monitor blood pressure.  Continue current dose of olmesartan-hydrochlorothiazide with changes per internal medicine.        Digestive   GERD (gastroesophageal reflux disease)    Suspect cough is related to exacerbation of gastroesophageal reflux based on increase in symptoms when lying down and improved when sitting or raising up.  Already taking 80 mg of pantoprazole daily.  Recommend trial of a quick acting antacid such as Tums prior to bedtime to see if that improves symptoms.  May need additional follow-up or medication adjustment if symptoms are improved.  Does not appear to be likely related to [place] vaccination.        Other   HIV disease ([place]) - Primary (Chronic)    Chad Little has well-controlled virus with good adherence and tolerance of Tivicay and Descovy.  Reviewed lab work and discussed plan of care.  Continue current dose of Tivicay and Descovy.  Plan for follow-up in 6 months or sooner if needed with lab  work on the same day.  1 to 2 weeks prior to appointment.      Relevant Medications   dolutegravir (TIVICAY) 50 MG tablet   emtricitabine-tenofovir AF (DESCOVY) 200-25 MG tablet   Other Relevant Orders   T-helper cell (CD4)- ([facility] clinic only)   HIV-1 RNA quant-no reflex-bld   Comprehensive metabolic panel   Healthcare maintenance    Discussed importance of safe sexual practice and condom use.  Condoms and STD testing offered. Influenza vaccination up-to-date Considering RSV vaccine.      Other Visit Diagnoses     Pharmacologic therapy       Relevant Orders   Lipid panel        I am having [name] "Chad Little" maintain his sildenafil, traMADol, methocarbamol, olmesartan-hydrochlorothiazide, pantoprazole, fluticasone, atorvastatin, Tivicay, and emtricitabine-tenofovir AF.   Meds ordered this encounter  Medications   dolutegravir (TIVICAY) 50 MG tablet    Sig: Take 1 tablet (50 mg total) by mouth daily.    Dispense:  30 tablet    Refill:  5    Order Specific Question:   Supervising Provider    Answer:   Baxter Flattery, CYNTHIA [[id number]]   emtricitabine-tenofovir AF (DESCOVY) 200-25 MG tablet    Sig: Take 1 tablet by mouth daily.    Dispense:  30 tablet    Refill:  5    Order Specific Question:   Supervising Provider    Answer:   Carlyle Basques [[id number]]     Follow-up: Return in about 6 months (around 05/17/2022), or if symptoms worsen or fail to improve.   Terri Piedra, MSN, FNP-C Nurse Practitioner [facility] [place] number: [phone]

## 2021-11-15 NOTE — Assessment & Plan Note (Signed)
Chad Little has well-controlled virus with good adherence and tolerance of Tivicay and Descovy.  Reviewed lab work and discussed plan of care.  Continue current dose of Tivicay and Descovy.  Plan for follow-up in 6 months or sooner if needed with lab work on the same day.  1 to 2 weeks prior to appointment.

## 2021-11-15 NOTE — Assessment & Plan Note (Signed)
Suspect cough is related to exacerbation of gastroesophageal reflux based on increase in symptoms when lying down and improved when sitting or raising up.  Already taking 80 mg of pantoprazole daily.  Recommend trial of a quick acting antacid such as Tums prior to bedtime to see if that improves symptoms.  May need additional follow-up or medication adjustment if symptoms are improved.  Does not appear to be likely related to [place] vaccination.

## 2021-12-05 DIAGNOSIS — Z419 Encounter for procedure for purposes other than remedying health state, unspecified: Secondary | ICD-10-CM | POA: Diagnosis not present

## 2021-12-14 ENCOUNTER — Ambulatory Visit: Payer: Medicaid Other | Admitting: Podiatry

## 2021-12-14 ENCOUNTER — Encounter: Payer: Self-pay | Admitting: Podiatry

## 2021-12-14 DIAGNOSIS — L603 Nail dystrophy: Secondary | ICD-10-CM

## 2021-12-14 NOTE — Progress Notes (Unsigned)
  Subjective:  Patient ID: Chad Little, male    DOB: [birth year],  MRN: [id number]  Chad Little presents to clinic today for {jgcomplaint:23593}  Chief Complaint  Patient presents with   Nail Problem    Thick painful toenails, 3 month follow up   New problem(s): None. {jgcomplaint:23593}  PCP is Serita Butcher, MD , and last visit was {Time; dates multiple:15870}.  No Known Allergies  Review of Systems: Negative except as noted in the HPI.  Objective: No changes noted in today's physical examination.  Chad Little is a pleasant 61 y.o. male {jgbodyhabitus:24098} AAO x 3.  Assessment/Plan: No diagnosis found.  No orders of the defined types were placed in this encounter.   {Jgplan:23602::"-Patient/POA to call should there be question/concern in the interim."}   Return in about 3 months (around 03/16/2022).  Marzetta Board, DPM

## 2021-12-21 ENCOUNTER — Encounter: Payer: Self-pay | Admitting: Student

## 2021-12-21 ENCOUNTER — Other Ambulatory Visit (HOSPITAL_COMMUNITY): Payer: Self-pay

## 2021-12-21 ENCOUNTER — Ambulatory Visit: Payer: Medicaid Other | Admitting: Student

## 2021-12-21 ENCOUNTER — Other Ambulatory Visit: Payer: Self-pay

## 2021-12-21 VITALS — BP 148/98 | HR 78 | Temp 97.7°F | Ht 77.0 in | Wt 258.2 lb

## 2021-12-21 DIAGNOSIS — Z1211 Encounter for screening for malignant neoplasm of colon: Secondary | ICD-10-CM | POA: Diagnosis not present

## 2021-12-21 DIAGNOSIS — M7702 Medial epicondylitis, left elbow: Secondary | ICD-10-CM | POA: Insufficient documentation

## 2021-12-21 DIAGNOSIS — L219 Seborrheic dermatitis, unspecified: Secondary | ICD-10-CM | POA: Diagnosis not present

## 2021-12-21 DIAGNOSIS — Z0000 Encounter for general adult medical examination without abnormal findings: Secondary | ICD-10-CM

## 2021-12-21 MED ORDER — SILDENAFIL CITRATE 50 MG PO TABS
50.0000 mg | ORAL_TABLET | ORAL | 1 refills | Status: DC | PRN
  Filled 2021-12-21: qty 20, 20d supply, fill #0

## 2021-12-21 MED ORDER — KETOCONAZOLE 2 % EX SHAM
1.0000 | MEDICATED_SHAMPOO | CUTANEOUS | 0 refills | Status: DC
  Filled 2021-12-21: qty 120, 30d supply, fill #0

## 2021-12-21 MED ORDER — PANTOPRAZOLE SODIUM 40 MG PO TBEC: 40.0000 mg | DELAYED_RELEASE_TABLET | Freq: Two times a day (BID) | ORAL | 0 refills | Status: DC

## 2021-12-21 MED ORDER — SILDENAFIL CITRATE 50 MG PO TABS: 50.0000 mg | ORAL_TABLET | ORAL | 1 refills | Status: DC | PRN

## 2021-12-21 NOTE — Assessment & Plan Note (Signed)
Colonoscopy referral placed

## 2021-12-21 NOTE — Patient Instructions (Addendum)
Mr. Peak,  It was great to see you in clinic today.  For your elbow pain, I have attached a pamphlet for your knowledge. Please ice the area and use ibuprofen as needed for pain relief. An elbow brace will also help with stability. For your rash, I am recommending a prescription grade shampoo. Please use this as directed on the bottle.  I have placed a referral for your colonoscopy. They will call you to schedule this. Please come back to see [place] in 3 months (or sooner if needed).  If you need medication refills please notify your pharmacy one week in advance and they will send [place] a request.

## 2021-12-21 NOTE — Assessment & Plan Note (Addendum)
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 He has not noticed erythema, warmth, or swelling in his left elbow during this time. He has taken Tylenol and Voltaren gel, which has had mild to no relief.   Exam shows left elbow tenderness to palpation in medial epicondyle region. With L elbow fully extended, mild pain with wrist flexion and moderate pain with arm pronation against resistance. No erythema, swelling, or warmth in left elbow. L arm strength and sensation intact.   Assessment: Patient's mechanism of injury and physical exam findings are suggestive of medial epicondylitis.  Plan:  -Encouraged pain to ice the area and take ibuprofen as needed for pain relief -Counseled patient that elbow brace will assist in stability and recovery -Provided patient with Golfer's elbow reference that includes information that will help in recovery

## 2021-12-21 NOTE — Progress Notes (Signed)
Subjective:   Patient ID: Chad Little male   DOB: 30-Dec-1960 61 y.o.   MRN: [id number]  HPI: [name] is a 61 y.o. man with medical history of HIV, well-controlled and osteoarthritis who presents for routine 63-monthfollow-up with additional chief concerns for left elbow pain and rash on his hairline. Please see problem-based assessment and plan charting for further details.  Review of Systems: Pertinent items are noted in HPI of problem-based charting.  Past Medical History:  Diagnosis Date   Arthritis    Enlarged prostate    Essential hypertension    GERD (gastroesophageal reflux disease)    Gout    diet controlled no recent flare ups   [place]    Superficial vein thrombosis 08/08/2020    Patient Active Problem List   Diagnosis Date Noted   Seborrheic dermatitis 12/21/2021   Medial epicondylitis of elbow, left 12/21/2021   Blurry vision, left eye 09/20/2021   Erectile dysfunction 08/15/2021   Lightheadedness 08/10/2021   Varicose veins of left lower extremity 08/10/2021   Arthritis of carpometacarpal (CMC) joint of left thumb 07/13/2021   Nail dystrophy 06/08/2021   Thoracic back pain 05/02/2021   Thumb pain, left 01/15/2021   Sprain of tibiofibular ligament of left ankle 01/15/2021   BPPV (benign paroxysmal positional vertigo), bilateral 10/23/2020   Seasonal allergies 07/06/2020   GERD (gastroesophageal reflux disease) 06/13/2020   S/P TURP 03/22/2020   Healthcare maintenance 05/25/2019   Prediabetes 04/14/2019   Episodic tension-type headache, not intractable 09/30/2015   Hypertension 10/31/2014   Onychomycosis 12/11/2011   HIV disease [place] 12/28/2007   Osteoarthritis 12/28/2007     Current Outpatient Medications  Medication Sig Dispense Refill   [START ON 12/24/2021] ketoconazole (NIZORAL) 2 % shampoo Apply 1 Application topically 2 (two) times a week. Apply to affected area(s) of skin, lather, leave on 5 minutes,  and rinse. 120 mL 0   atorvastatin (LIPITOR) 10 MG tablet Take 1 tablet (10 mg total) by mouth daily. 30 tablet 11   dolutegravir (TIVICAY) 50 MG tablet Take 1 tablet (50 mg total) by mouth daily. 30 tablet 5   emtricitabine-tenofovir AF (DESCOVY) 200-25 MG tablet Take 1 tablet by mouth daily. 30 tablet 5   fluticasone (FLONASE) 50 MCG/ACT nasal spray Place 1 spray into both nostrils daily. 11.1 g 3   methocarbamol (ROBAXIN) 500 MG tablet Take 1 tablet (500 mg total) by mouth 2 (two) times daily. 30 tablet 0   olmesartan-hydrochlorothiazide (BENICAR HCT) 40-25 MG tablet Take 1 tablet by mouth daily. 90 tablet 3   pantoprazole (PROTONIX) 40 MG tablet Take 1 tablet (40 mg total) by mouth 2 (two) times daily. 180 tablet 0   sildenafil (VIAGRA) 50 MG tablet Take 1 tablet (50 mg total) by mouth as needed for erectile dysfunction. 20 tablet 1   traMADol (ULTRAM) 50 MG tablet Take 1 tablet (50 mg total) by mouth every 6 (six) hours as needed. 15 tablet 0   No current facility-administered medications for this visit.     Objective:   Physical Exam: Vitals:   12/21/21 0903  BP: (!) 148/98  Pulse: 78  Temp: 97.7 F (36.5 C)  TempSrc: Oral  SpO2: 98%  Weight: 258 lb 3.2 oz (117.1 kg)  Height: '6\' 5"'$  (1.956 m)    Constitutional: well appearing, in no acute distress Cardiovascular: regular rate with normal rhythm, no murmurs. Pulmonary/Chest: normal work of breathing on room air, lungs clear to auscultation  bilaterally Abdominal: soft, non-tender, non-distended, bowel sounds present MSK: Left elbow with tenderness to palpation in medial epicondyle region. With elbow fully extended, mild pain with L wrist flexion and moderate pain with L arm pronation against resistance. No erythema, swelling, or warmth in left elbow. L arm strength and sensation intact. No lower extremity edema. Skin: warm and dry. Scalp with hypopigmentation along anterior hair line. Hypopigmentation throughout other regions of  scalp. Small satellite hypopigmentation spot in cheek area. Neurological: alert and answering questions appropriately. 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 He has not noticed erythema, warmth, or swelling in his left elbow during this time. He has taken Tylenol and Voltaren gel, which has had mild to no relief.   Exam shows left elbow tenderness to palpation in medial epicondyle region. With L elbow fully extended, mild pain with wrist flexion and moderate pain with arm pronation against resistance. No erythema, swelling, or warmth in left elbow. L arm strength and sensation intact.   Assessment: Patient's mechanism of injury and physical exam findings are suggestive of medial epicondylitis.  Plan:  -Encouraged pain to ice the area and take ibuprofen as needed for pain relief -Counseled patient that elbow brace will assist in stability and recovery -Provided patient with Golfer's elbow reference that includes information that will help in recovery  Seborrheic dermatitis Patient reports 3 month history of hypopigmentation in his scalp region and a few satellite spots on his face. He explains the hypopigmentation appears to be more pronounced over the past week and he endorses he has been in the sun longer during the past week. He reports he has not applied any new products during this time and before the hypopigmentation occurred. He has not had pain or burning on the rash regions. He reports he experienced hypopigmentation in this area a while ago during his childhood as well that improved. Patient reports trying OTC Selsun Blue with minimal improvement.   Exam today shows scalp hypopigmentation along anterior hair line and throughout other regions of scalp. There is  also small satellite hypopigmentation spot in cheek area.  Assessment: Suspect hypopigmentation along scalp is from seborrheic dermatitis.  Plan Start ketoconazole 2% shampoo 2 times a week  Healthcare maintenance Colonoscopy referral placed   Patient discussed with Dr. Philipp Ovens  Paulmichael Schreck (Max) Mustang, MS3 12/21/2021, 12:08 PM

## 2021-12-21 NOTE — Assessment & Plan Note (Addendum)
Patient reports 3 month history of hypopigmentation in his scalp region and a few satellite spots on his face. He explains the hypopigmentation appears to be more pronounced over the past week and he endorses he has been in the sun longer during the past week. He reports he has not applied any new products during this time and before the hypopigmentation occurred. He has not had pain or burning on the rash regions. He reports he experienced hypopigmentation in this area a while ago during his childhood as well that improved. Patient reports trying OTC Selsun Blue with minimal improvement.   Exam today shows scalp hypopigmentation along anterior hair line and throughout other regions of scalp. There is also small satellite hypopigmentation spot in cheek area.  Assessment: Suspect hypopigmentation along scalp is from seborrheic dermatitis.  Plan Start ketoconazole 2% shampoo 2 times a week

## 2021-12-24 ENCOUNTER — Telehealth: Payer: Self-pay | Admitting: Podiatry

## 2021-12-24 NOTE — Progress Notes (Signed)
Internal Medicine Clinic Attending  Case discussed with Dr. Jinwala  At the time of the visit.  We reviewed the resident's history and exam and pertinent patient test results.  I agree with the assessment, diagnosis, and plan of care documented in the resident's note.  

## 2021-12-24 NOTE — Telephone Encounter (Signed)
Patient called and stated that his nails was not filled all the way down and his toes are hurting when he wears his shoes. He states that we the toenails are not smooth all the way down this happens. He wanted to know if he can come back to the office.

## 2022-01-04 DIAGNOSIS — Z419 Encounter for procedure for purposes other than remedying health state, unspecified: Secondary | ICD-10-CM | POA: Diagnosis not present

## 2022-02-04 DIAGNOSIS — Z419 Encounter for procedure for purposes other than remedying health state, unspecified: Secondary | ICD-10-CM | POA: Diagnosis not present

## 2022-03-04 NOTE — Progress Notes (Signed)
CC: Fatigue  HPI:   [name] is a 62 y.o. male with a past medical history of hypertension, gout, and HIV who presents for fatigue. He was last seen at [facility] in 12-2021.   Past Medical History:  Diagnosis Date   Arthritis    Enlarged prostate    Essential hypertension    GERD (gastroesophageal reflux disease)    Gout    diet controlled no recent flare ups   HIV infection (HCC)    Superficial vein thrombosis 08/08/2020     Review of Systems:    Reports fatigue, knee pain Denies fever, chills, unintentional weight loss, cough, breath shortness, exertional dyspnea, recent illnesses, snoring, frequent nighttime awakenings   Physical Exam:  Vitals:   03/05/22 0936 03/05/22 0951  BP: (!) 142/89 (!) 127/93  Pulse: 89 89  Temp: 98 F (36.7 C)   TempSrc: Oral   SpO2: 97%   Weight: 262 lb 4.8 oz (119 kg)   Height: '6\' 5"'$  (1.956 m)     General:   awake and alert, sitting comfortably in chair, cooperative, not in acute distress Skin:   warm and dry, no swelling or erythema over knee joints Eyes:   extraocular movements intact, conjunctivae pink Lungs:   normal respiratory effort, breathing unlabored, symmetrical chest rise, no crackles or wheezing Cardiac:   regular rate and rhythm, normal S1 and S2 Musculoskeletal:   joint line tenderness M>L in knees bilaterally, no varus-valgus instability, strength 5/5 bilaterally Neurologic:   oriented to person-place-time, moving all extremities, no gross focal deficits Psychiatric:   euthymic mood with congruent affect, intelligible speech    Assessment & Plan:   Fatigue Patient reports increased fatigue over the last one month or so. Describes feeling low on energy upon waking up and this feeling persists throughout the day, to the point where accomplishing his routine daily tasks requires a more deliberate effort. Denies fever, chills, unintentional weight loss, cough, breath shortness, exertional dyspnea, recent  illnesses, snoring, and frequent nighttime awakenings. Further denies recent changes to his medications, physical activity level, and diet. He continues to take his antiviral medications and his HIV remains well-controlled, last saw ID [id number]. Conjunctival pallor absent on exam. Of note, the patient has a remote history of vitamin D deficiency treated several years ago with supplementation that was discontinued after his values and symptoms improved. Etiology of fatigue remains unclear at this time, further diagnostic workup is warranted. Differential includes anemia, vitamin D deficiency, diabetes, and electrolyte or hormonal derangements. Most recent A1C was 6.0 in 10-2021.  - Check CMP, Vit D, TSH, CBC, and A1C    Hypertension Patient has history of hypertension managed at home with olmesartan-hydrochlorothiazide, to which he reports daily adherence. Recently stopped checking his BP due to consistent values in the 130s/80s. Today in clinic, his BP was 127/93. Given that these values are above our goal of <130/80, escalation of BP management is warranted. Suggested adding amlodipine in the form of Tribenzor to reduce pill burden, to which patient agreed.  - Discontinue Benicar (olmesartan-hydrochlorothiazide) 40-'25mg'$  q24 - Start Tribenzor (olmesartan-amlodipine-hydrochlorothiazide) 40-5-'25mg'$  q24 - Encourage regular home blood pressure checks and return to clinic in two months    Osteoarthritis Patient reports gradually worsening bilateral knee pain, R>L. He works in [employer] and states that he has a history of knee osteoarthritis. Denies trauma, injury, or acute changes in his pain level. Most recent knee radiograph of the left knee performed in 2013 revealed mild degenerative narrowing of the medial  joint compartment. Exam notable for joint line tenderness M>L in both knees. No swelling, erythema, or varus-valgus instability. About 3-4 years ago, he received corticosteroids which provided  about 90% relief for several years up until very recently. In the past, he also experienced great success with physical therapy.  - Referral to physical therapy for bilateral knee osteoarthritis - Manage symptoms with '400mg'$  ibuprofen three times daily as needed - Implement rest-ice-compress-elevate protocol and modify activity if necessary - Return to clinic in two months for reevaluation and consideration of corticosteroid injections     Healthcare maintenance Last colonoscopy in 10-2016 demonstrated two right colon polyps with fragments of tubular adenoma. Repeat screening colonoscopy is recommended in 7-10 years, or 10-2023 at the earliest.  - Screening colonoscopy in 10-2023      See Encounters Tab for problem based charting.  Patient discussed with Dr. Heber [place]

## 2022-03-05 ENCOUNTER — Other Ambulatory Visit: Payer: Self-pay

## 2022-03-05 ENCOUNTER — Ambulatory Visit: Payer: Medicaid Other | Admitting: Student

## 2022-03-05 ENCOUNTER — Encounter: Payer: Self-pay | Admitting: Student

## 2022-03-05 VITALS — BP 127/93 | HR 89 | Temp 98.0°F | Ht 77.0 in | Wt 262.3 lb

## 2022-03-05 DIAGNOSIS — I10 Essential (primary) hypertension: Secondary | ICD-10-CM | POA: Diagnosis not present

## 2022-03-05 DIAGNOSIS — Z1211 Encounter for screening for malignant neoplasm of colon: Secondary | ICD-10-CM

## 2022-03-05 DIAGNOSIS — R7303 Prediabetes: Secondary | ICD-10-CM

## 2022-03-05 DIAGNOSIS — R5383 Other fatigue: Secondary | ICD-10-CM | POA: Insufficient documentation

## 2022-03-05 DIAGNOSIS — M170 Bilateral primary osteoarthritis of knee: Secondary | ICD-10-CM

## 2022-03-05 DIAGNOSIS — Z0000 Encounter for general adult medical examination without abnormal findings: Secondary | ICD-10-CM

## 2022-03-05 LAB — GLUCOSE, CAPILLARY: Glucose-Capillary: 103 mg/dL — ABNORMAL HIGH (ref 70–99)

## 2022-03-05 LAB — POCT GLYCOSYLATED HEMOGLOBIN (HGB A1C): Hemoglobin A1C: 6.3 % — AB (ref 4.0–5.6)

## 2022-03-05 MED ORDER — OLMESARTAN-AMLODIPINE-HCTZ 40-5-25 MG PO TABS: 1.0000 | ORAL_TABLET | Freq: Every day | ORAL | 2 refills | Status: DC

## 2022-03-05 NOTE — Assessment & Plan Note (Signed)
Last colonoscopy in 10-2016 demonstrated two right colon polyps with fragments of tubular adenoma. Repeat screening colonoscopy is recommended in 7-10 years, or 10-2023 at the earliest.  - Screening colonoscopy in 10-2023

## 2022-03-05 NOTE — Assessment & Plan Note (Signed)
Patient has history of hypertension managed at home with olmesartan-hydrochlorothiazide, to which he reports daily adherence. Recently stopped checking his BP due to consistent values in the 130s/80s. Today in clinic, his BP was 127/93. Given that these values are above our goal of <130/80, escalation of BP management is warranted. Suggested adding amlodipine in the form of Tribenzor to reduce pill burden, to which patient agreed.  - Discontinue Benicar (olmesartan-hydrochlorothiazide) 40-'25mg'$  q24 - Start Tribenzor (olmesartan-amlodipine-hydrochlorothiazide) 40-5-'25mg'$  q24 - Encourage regular home blood pressure checks and return to clinic in two months

## 2022-03-05 NOTE — Assessment & Plan Note (Signed)
Patient reports gradually worsening bilateral knee pain, R>L. He works in [employer] and states that he has a history of knee osteoarthritis. Denies trauma, injury, or acute changes in his pain level. Most recent knee radiograph of the left knee performed in 2013 revealed mild degenerative narrowing of the medial joint compartment. Exam notable for joint line tenderness M>L in both knees. No swelling, erythema, or varus-valgus instability. About 3-4 years ago, he received corticosteroids which provided about 90% relief for several years up until very recently. In the past, he also experienced great success with physical therapy.  - Referral to physical therapy for bilateral knee osteoarthritis - Manage symptoms with '400mg'$  ibuprofen three times daily as needed - Implement rest-ice-compress-elevate protocol and modify activity if necessary - Return to clinic in two months for reevaluation and consideration of corticosteroid injections

## 2022-03-05 NOTE — Patient Instructions (Signed)
  Thank you, Mr.Oren JAISHAWN WITZKE, for allowing [place] to provide your care today. Today we discussed . . .  > Fatigue       - we are checking lab tests to determine the cause of your fatigue and will call you with the results > Arthritis       - we are referring you to physical therapy for your knee pain       - you can also take ibuprofen '400mg'$  three times per day as needed    I have ordered the following labs for you:   Lab Orders         BMP8+Anion Gap         CBC no Diff         TSH         T4, Free         Vitamin D (25 hydroxy)         POC Hbg A1C       Tests ordered today:  none   Referrals ordered today:    Referral Orders         Ambulatory referral to Physical Therapy       I have ordered the following medication/changed the following medications:   Stop the following medications: Medications Discontinued During This Encounter  Medication Reason   olmesartan-hydrochlorothiazide (BENICAR HCT) 40-25 MG tablet      Start the following medications: Meds ordered this encounter  Medications   Olmesartan-amLODIPine-HCTZ (TRIBENZOR) 40-5-25 MG TABS    Sig: Take 1 tablet by mouth daily.    Dispense:  30 tablet    Refill:  2      Follow up: 2 months    Remember:  Please start physical therapy and we will let you know the results of your blood work. See you again in about two months!   Should you have any questions or concerns please call the internal medicine clinic at [phone].     Roswell Nickel, MD [place]

## 2022-03-05 NOTE — Assessment & Plan Note (Signed)
Patient reports increased fatigue over the last one month or so. Describes feeling low on energy upon waking up and this feeling persists throughout the day, to the point where accomplishing his routine daily tasks requires a more deliberate effort. Denies fever, chills, unintentional weight loss, cough, breath shortness, exertional dyspnea, recent illnesses, snoring, and frequent nighttime awakenings. Further denies recent changes to his medications, physical activity level, and diet. He continues to take his antiviral medications and his HIV remains well-controlled, last saw ID [id number]. Conjunctival pallor absent on exam. Of note, the patient has a remote history of vitamin D deficiency treated several years ago with supplementation that was discontinued after his values and symptoms improved. Etiology of fatigue remains unclear at this time, further diagnostic workup is warranted. Differential includes anemia, vitamin D deficiency, diabetes, and electrolyte or hormonal derangements. Most recent A1C was 6.0 in 10-2021.  - Check CMP, Vit D, TSH, CBC, and A1C

## 2022-03-06 LAB — CBC
Hematocrit: 49.8 % (ref 37.5–51.0)
Hemoglobin: 16.9 g/dL (ref 13.0–17.7)
MCH: 32 pg (ref 26.6–33.0)
MCHC: 33.9 g/dL (ref 31.5–35.7)
MCV: 94 fL (ref 79–97)
Platelets: 304 10*3/uL (ref 150–450)
RBC: 5.28 x10E6/uL (ref 4.14–5.80)
RDW: 12.9 % (ref 11.6–15.4)
WBC: 6.7 10*3/uL (ref 3.4–10.8)

## 2022-03-06 LAB — BMP8+ANION GAP
Anion Gap: 18 mmol/L (ref 10.0–18.0)
BUN/Creatinine Ratio: 9 — ABNORMAL LOW (ref 10–24)
BUN: 13 mg/dL (ref 8–27)
CO2: 24 mmol/L (ref 20–29)
Calcium: 10.3 mg/dL — ABNORMAL HIGH (ref 8.6–10.2)
Chloride: 101 mmol/L (ref 96–106)
Creatinine, Ser: 1.41 mg/dL — ABNORMAL HIGH (ref 0.76–1.27)
Glucose: 99 mg/dL (ref 70–99)
Potassium: 4.4 mmol/L (ref 3.5–5.2)
Sodium: 143 mmol/L (ref 134–144)
eGFR: 42 mL/min/{1.73_m2} — ABNORMAL LOW (ref >59)

## 2022-03-06 LAB — T4, FREE: Free T4: 1.34 ng/dL (ref 0.82–1.77)

## 2022-03-06 LAB — VITAMIN D 25 HYDROXY (VIT D DEFICIENCY, FRACTURES): Vit D, 25-Hydroxy: 35 ng/mL (ref 30.0–100.0)

## 2022-03-06 LAB — TSH: TSH: 1.33 u[IU]/mL (ref 0.450–4.500)

## 2022-03-07 DIAGNOSIS — Z419 Encounter for procedure for purposes other than remedying health state, unspecified: Secondary | ICD-10-CM | POA: Diagnosis not present

## 2022-03-10 ENCOUNTER — Other Ambulatory Visit: Payer: Self-pay

## 2022-03-10 DIAGNOSIS — J302 Other seasonal allergic rhinitis: Secondary | ICD-10-CM

## 2022-03-13 ENCOUNTER — Other Ambulatory Visit: Payer: Self-pay

## 2022-03-13 ENCOUNTER — Ambulatory Visit: Payer: Medicaid Other | Attending: Internal Medicine

## 2022-03-13 DIAGNOSIS — M25562 Pain in left knee: Secondary | ICD-10-CM | POA: Insufficient documentation

## 2022-03-13 DIAGNOSIS — M170 Bilateral primary osteoarthritis of knee: Secondary | ICD-10-CM | POA: Diagnosis not present

## 2022-03-13 DIAGNOSIS — R252 Cramp and spasm: Secondary | ICD-10-CM | POA: Diagnosis not present

## 2022-03-13 DIAGNOSIS — M6281 Muscle weakness (generalized): Secondary | ICD-10-CM | POA: Diagnosis not present

## 2022-03-13 DIAGNOSIS — M5459 Other low back pain: Secondary | ICD-10-CM | POA: Diagnosis not present

## 2022-03-13 DIAGNOSIS — M25561 Pain in right knee: Secondary | ICD-10-CM | POA: Insufficient documentation

## 2022-03-13 NOTE — Therapy (Signed)
OUTPATIENT PHYSICAL THERAPY LOWER EXTREMITY EVALUATION   Patient Name: Chad Little MRN: [id number] DOB:[birth year], 62 y.o., male Today's Date: 03/13/2022  END OF SESSION:  PT End of Session - 03/13/22 0853     Visit Number 1    Number of Visits 13    Date for PT Re-Evaluation 05/03/22    Authorization Type [place] MEDICAID [facility]    PT Start Time 0848    PT Stop Time 0933    PT Time Calculation (min) 45 min    Activity Tolerance Patient tolerated treatment well    Behavior During Therapy [facility] for tasks assessed/performed             Past Medical History:  Diagnosis Date   Arthritis    Enlarged prostate    Essential hypertension    GERD (gastroesophageal reflux disease)    Gout    diet controlled no recent flare ups   HIV infection ([employer])    Superficial vein thrombosis 08/08/2020   Past Surgical History:  Procedure Laterality Date   APPENDECTOMY  2005   open   Right elbow surgery  dec3 2020   in [place]   TRANSURETHRAL RESECTION OF PROSTATE N/A 02/14/2020   Procedure: TRANSURETHRAL RESECTION OF THE PROSTATE (TURP), BIPOLAR;  Surgeon: Janith Lima, MD;  Location: [facility];  Service: Urology;  Laterality: N/A;   Patient Active Problem List   Diagnosis Date Noted   Fatigue 03/05/2022   Seborrheic dermatitis 12/21/2021   Medial epicondylitis of left elbow 12/21/2021   Blurry vision, left eye 09/20/2021   Erectile dysfunction 08/15/2021   Lightheadedness 08/10/2021   Varicose veins of left lower extremity 08/10/2021   Arthritis of carpometacarpal [facility]) joint of left thumb 07/13/2021   Nail dystrophy 06/08/2021   Thoracic back pain 05/02/2021   Thumb pain, left 01/15/2021   Sprain of tibiofibular ligament of left ankle 01/15/2021   BPPV (benign paroxysmal positional vertigo), bilateral 10/23/2020   Seasonal allergies 07/06/2020   GERD (gastroesophageal reflux disease) 06/13/2020   S/P TURP 03/22/2020   Healthcare maintenance 05/25/2019    Prediabetes 04/14/2019   Episodic tension-type headache, not intractable 09/30/2015   Hypertension 10/31/2014   Onychomycosis 12/11/2011   HIV disease ([place]) 12/28/2007   Osteoarthritis 12/28/2007    PCP: Serita Butcher, MD  REFERRING PROVIDER: Lucious Groves, DO  REFERRING DIAG: M17.0 (ICD-10-CM) - Osteoarthritis of both knees, unspecified osteoarthritis type  THERAPY DIAG:  Acute bilateral knee pain  Muscle weakness (generalized)  Rationale for Evaluation and Treatment: Rehabilitation  ONSET DATE: 03/01/22  SUBJECTIVE:   SUBJECTIVE STATEMENT: Pt reports after climbing up and down a ladder and roof approx 3-4x, 1.5 weeks ago, both knees started hurting the next day. The knees hurt significantly for 6 days, but have slowly gotten better with his using Voltaren cream and ibuprofen. Pt notes 3 years ago receiving cortisone injections and he did not have knee pain until this most recent event.   PERTINENT HISTORY: OA, HIV, Hx gout PAIN:  Are you having pain? Yes: NPRS scale: 7/10 Pain location: B knees  Pain description: sharp, throb, ache constant Aggravating factors: All activities Relieving factors: Voltaren and ibuprofen,  10/10 when it occured  PRECAUTIONS: HIV  WEIGHT BEARING RESTRICTIONS: No  FALLS:  Has patient fallen in last 6 months? 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 05/15/11-Mild narrowing of medial joint compartment.  No joint effusion.   PATIENT SURVEYS:  LEFS 39, 48%  COGNITION: Overall cognitive status: Within functional limits for tasks assessed     SENSATION: WFL  EDEMA:  None observed or palpated  MUSCLE LENGTH: Hamstrings: Right TBA deg; Left TBA deg Marcello Moores test: Right TBA deg;  Left TBA deg  POSTURE: No Significant postural limitations  PALPATION: TTP medial and lateral patella's bilat and taut muscle bands vastus lateralis bilat  LOWER EXTREMITY ROM:  Active ROM Right eval Left eval  Hip flexion    Hip extension    Hip abduction    Hip adduction    Hip internal rotation    Hip external rotation    Knee flexion    Knee extension    Ankle dorsiflexion    Ankle plantarflexion    Ankle inversion    Ankle eversion     (Blank rows = not tested)  LOWER EXTREMITY MMT:  MMT Right eval Left eval  Hip flexion    Hip extension    Hip abduction    Hip adduction    Hip internal rotation    Hip external rotation    Knee flexion 0 0  Knee extension 125 125  Ankle dorsiflexion    Ankle plantarflexion    Ankle inversion    Ankle eversion     (Blank rows = not tested)  LOWER EXTREMITY SPECIAL TESTS:  Knee special tests: McMurray's test: negative, Patellafemoral apprehension test: negative, Patellafemoral grind test: negative, and Patella tap test (ballotable patella): negative  FUNCTIONAL TESTS:  5 times sit to stand: 20.6"   GAIT: Distance walked: 200' Assistive device utilized: None Level of assistance: Complete Independence Comments: WNLs' for distanced walked. Pt reports an antalgic gait pattern with prolonged walking or activity  TODAY'S TREATMENT:                                                                                                                               [facility] Adult PT Treatment:                                                DATE: 03/13/22 Therapeutic Exercise: Developed, instructed in, and pt completed therex as noted in HEP  Self Care: Use of cold packs 10-15 mins for symptom management   PATIENT EDUCATION:  Education details: Eval findings, POC, HEP, self care  Person educated: Patient Education method: Explanation, Demonstration, Tactile cues, Verbal cues, and Handouts Education comprehension: verbalized  understanding, returned demonstration, verbal cues required, and tactile cues required  HOME EXERCISE PROGRAM: Access Code: [email or web address] URL: [email or web address] Date: 03/13/2022 Prepared by: Gar Ponto  Exercises - Active Straight Leg Raise with Quad Set  - 1  x daily - 7 x weekly - 2 sets - 10 reps - 3 hold - Sidelying Hip Abduction  - 1 x daily - 7 x weekly - 2 sets - 10 reps - 3 hold - Hooklying Clamshell with Resistance  - 1 x daily - 7 x weekly - 2 sets - 10 reps - 3 hold  ASSESSMENT:  CLINICAL IMPRESSION: Patient is a 62 y.o. male who was seen today for physical therapy evaluation and treatment for M17.0 (ICD-10-CM) - Osteoarthritis of both knees, unspecified osteoarthritis type . Pt presents with an acute flare up of bilat knee OA with decreased bilat hip strength and increased muscle tightness of the bilat vastus lateralis. Pt will benefit from skilled PT to address impairments for improved LE function an mobility.   OBJECTIVE IMPAIRMENTS: decreased activity tolerance, difficulty walking, decreased strength, increased muscle spasms, and pain.   ACTIVITY LIMITATIONS: carrying, lifting, bending, sitting, standing, squatting, stairs, and locomotion level  PARTICIPATION LIMITATIONS: cleaning, laundry, and occupation  PERSONAL FACTORS: Past/current experiences, Profession, and Time since onset of injury/illness/exacerbation are also affecting patient's functional outcome.   REHAB POTENTIAL: Good  CLINICAL DECISION MAKING: Stable/uncomplicated  EVALUATION COMPLEXITY: Low   GOALS:  SHORT TERM GOALS: Target date: 03/29/22 Pt will be Ind in an initial HEP  Baseline: initiated Goal status: INITIAL  2.  Pt will voice understanding of measures to assist in pain reduction  Baseline: initiated Goal status: INITIAL  LONG TERM GOALS: Target date: 05/03/22  Pt will be Ind in a final HEP to maintain achieved LOF  Baseline: initiated Goal status: INITIAL  2.   Increase bilat hip strength to 5/5 for improved lower body  Baseline: see flow sheets Goal status: INITIAL  3.  Improve 5xSTS by MCID of 5" as indication of improved functional mobility  Baseline: 20.6 s use of hands Goal status: INITIAL  4.  Pt's LEFS score will improved to 70% or greater as indication of improved function  Baseline: 48% Goal status: INITIAL  5.  Pt will report a decrease in bilat knee pain to 3/10 of less intermittently for improve function of the Legs with ADLs and his work with home improvement Baseline: 7/10 Goal status: INITIAL  PLAN:  PT FREQUENCY: 2x/week  PT DURATION: 6 weeks  PLANNED INTERVENTIONS: Therapeutic exercises, Therapeutic activity, Balance training, Gait training, Patient/Family education, Self Care, Joint mobilization, Stair training, Aquatic Therapy, Dry Needling, Electrical stimulation, Cryotherapy, Moist heat, Taping, Vasopneumatic device, Ultrasound, Ionotophoresis '4mg'$ /ml Dexamethasone, Manual therapy, and Re-evaluation  PLAN FOR NEXT SESSION: Review LEFS; assess hamstring flexibility and Thomas test; assess response to HEP; progress therex as indicated; use of modalities, manual therapy; and TPDN as indicated.  Kassiah Mccrory MS, PT 03/13/22 4:25 PM  [employer] Authorization   Choose one: Rehabilitative  Standardized Assessment or Functional Outcome Tool: See Pain Assessment and LEFS  Score or Percent Disability: 48% Out of 100% Body Parts Treated (Select each separately):  Knee bilaterally. Overall deficits/functional limitations for body part selected: moderate  If treatment provided at initial evaluation, no treatment charged due to lack of authorization.

## 2022-03-20 NOTE — Therapy (Signed)
OUTPATIENT PHYSICAL THERAPY TREATMENT NOTE   Patient Name: CAISON VERNIER MRN: [id number] DOB:Mar 10, 1960, 62 y.o., male Today's Date: 03/21/2022  PCP: Serita Butcher, MD  REFERRING PROVIDER: Lucious Groves, DO   END OF SESSION:   PT End of Session - 03/21/22 0838     Visit Number 2    Number of Visits 13    Date for PT Re-Evaluation 05/03/22    Authorization Type [place] MEDICAID [facility] 6 visits    PT Start Time 0806    PT Stop Time 0849    PT Time Calculation (min) 43 min    Activity Tolerance Patient tolerated treatment well    Behavior During Therapy [facility] for tasks assessed/performed             Past Medical History:  Diagnosis Date   Arthritis    Enlarged prostate    Essential hypertension    GERD (gastroesophageal reflux disease)    Gout    diet controlled no recent flare ups   HIV infection ([place])    Superficial vein thrombosis 08/08/2020   Past Surgical History:  Procedure Laterality Date   APPENDECTOMY  2005   open   Right elbow surgery  dec3 2020   in [place]   TRANSURETHRAL RESECTION OF PROSTATE N/A 02/14/2020   Procedure: TRANSURETHRAL RESECTION OF THE PROSTATE (TURP), BIPOLAR;  Surgeon: Janith Lima, MD;  Location: [facility];  Service: Urology;  Laterality: N/A;   Patient Active Problem List   Diagnosis Date Noted   Fatigue 03/05/2022   Seborrheic dermatitis 12/21/2021   Medial epicondylitis of left elbow 12/21/2021   Blurry vision, left eye 09/20/2021   Erectile dysfunction 08/15/2021   Lightheadedness 08/10/2021   Varicose veins of left lower extremity 08/10/2021   Arthritis of carpometacarpal (CMC) joint of left thumb 07/13/2021   Nail dystrophy 06/08/2021   Thoracic back pain 05/02/2021   Thumb pain, left 01/15/2021   Sprain of tibiofibular ligament of left ankle 01/15/2021   BPPV (benign paroxysmal positional vertigo), bilateral 10/23/2020   Seasonal allergies 07/06/2020   GERD (gastroesophageal reflux disease)  06/13/2020   S/P TURP 03/22/2020   Healthcare maintenance 05/25/2019   Prediabetes 04/14/2019   Episodic tension-type headache, not intractable 09/30/2015   Hypertension 10/31/2014   Onychomycosis 12/11/2011   HIV disease ([place]) 12/28/2007   Osteoarthritis 12/28/2007    REFERRING DIAG: M17.0 (ICD-10-CM) - Osteoarthritis of both knees, unspecified osteoarthritis type   THERAPY DIAG:  Acute bilateral knee pain  Muscle weakness (generalized)  Cramp and spasm  Other low back pain  Rationale for Evaluation and Treatment Rehabilitation  ONSET DATE: 03/01/22   SUBJECTIVE:    SUBJECTIVE STATEMENT: Pt reports both knees are doing better with less pain. Pt notes he has been completing his therex.  PAIN:  Are you having pain? Yes: NPRS scale: 5/10 Pain location: B knees  Pain description: sharp, throb, ache constant Aggravating factors: All activities Relieving factors: Voltaren and ibuprofen,  10/10 when it occurred    PERTINENT HISTORY: OA, HIV, Hx gout   PRECAUTIONS: HIV   WEIGHT BEARING RESTRICTIONS: No   FALLS:  Has patient fallen in last 6 months? 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 05/15/11-Mild narrowing of medial joint compartment.  No joint effusion.    PATIENT SURVEYS:  LEFS 39, 48%   COGNITION: Overall cognitive status: Within functional limits for tasks assessed                         SENSATION: WFL   EDEMA:  None observed or palpated   MUSCLE LENGTH: Hamstrings: Right TBA deg; Left TBA deg Marcello Moores test: Right TBA deg; Left TBA deg   POSTURE: No Significant postural limitations   PALPATION: TTP medial and  lateral patella's bilat and taut muscle bands vastus lateralis bilat   LOWER EXTREMITY ROM:   Active ROM Right eval Left eval  Hip flexion      Hip extension      Hip abduction      Hip adduction      Hip internal rotation      Hip external rotation      Knee flexion      Knee extension      Ankle dorsiflexion      Ankle plantarflexion      Ankle inversion      Ankle eversion       (Blank rows = not tested)   LOWER EXTREMITY MMT:   MMT Right eval Left eval  Hip flexion      Hip extension      Hip abduction      Hip adduction      Hip internal rotation      Hip external rotation      Knee flexion 0 0  Knee extension 125 125  Ankle dorsiflexion      Ankle plantarflexion      Ankle inversion      Ankle eversion       (Blank rows = not tested)   LOWER EXTREMITY SPECIAL TESTS:  Knee special tests: McMurray's test: negative, Patellafemoral apprehension test: negative, Patellafemoral grind test: negative, and Patella tap test (ballotable patella): negative   FUNCTIONAL TESTS:  5 times sit to stand: 20.6"    GAIT: Distance walked: 200' Assistive device utilized: None Level of assistance: Complete Independence Comments: WNLs' for distanced walked. Pt reports an antalgic gait pattern with prolonged walking or activity   TODAY'S TREATMENT:   [facility] Adult PT Treatment:                                                DATE: 03/21/22 Therapeutic Exercise: SLR c quad set 2x10 3" each Hip abd 2x10 3" each Hip ER BluTB 2x10 3" each LAQ 2x10 3" each STS x10 Manual Therapy: STM/DTM to the L and R quads Skilled palpation to identify TrPs and taut muscle bands Trigger Point Dry Needling Treatment: Pre-treatment instruction: Patient instructed on dry needling rationale, procedures, and possible side effects including pain during treatment (achy,cramping feeling), bruising, drop of blood, lightheadedness, nausea, sweating. Patient Consent Given: Yes Education handout provided:  Yes Muscles treated: L and R vastus lateralis  Needle size and number: .30x15m x 2 Electrical stimulation performed: No Parameters:  NA Treatment response/outcome: Twitch response elicited Post-treatment instructions: Patient instructed to expect possible mild to moderate muscle soreness later today and/or tomorrow. Patient instructed in methods to reduce muscle soreness and to continue prescribed HEP. If patient was dry needled over the lung field,  patient was instructed on signs and symptoms of pneumothorax and, however unlikely, to see immediate medical attention should they occur. Patient was also educated on signs and symptoms of infection and to seek medical attention should they occur. Patient verbalized understanding of these instructions and education.                                                                                                                               [facility] Adult PT Treatment:                                                DATE: 03/13/22 Therapeutic Exercise: Developed, instructed in, and pt completed therex as noted in HEP  Self Care: Use of cold packs 10-15 mins for symptom management   PATIENT EDUCATION:  Education details: Eval findings, POC, HEP, self care  Person educated: Patient Education method: Explanation, Demonstration, Tactile cues, Verbal cues, and Handouts Education comprehension: verbalized understanding, returned demonstration, verbal cues required, and tactile cues required   HOME EXERCISE PROGRAM: Access Code: [email or web address] URL: [email or web address] Date: 03/21/2022 Prepared by: Gar Ponto  Exercises - Active Straight Leg Raise with Quad Set  - 1 x daily - 7 x weekly - 2 sets - 10 reps - 3 hold - Sidelying Hip Abduction  - 1 x daily - 7 x weekly - 2 sets - 10 reps - 3 hold - Hooklying Clamshell with Resistance  - 1 x daily - 7 x weekly - 2 sets - 10 reps - 3 hold - Seated Long Arc Quad  - 1 x daily - 7 x weekly - 2 sets - 10  reps - 3 hold - Sit to Stand Without Arm Support  - 1 x daily - 7 x weekly - 1 sets - 10 reps - 3 hold   ASSESSMENT:   CLINICAL IMPRESSION: Pt returns to PT after the initial eval. He reports some improvement in his bilat knee pain. PT was completed for manual therapy as noted above f/b TPDN. Muscle twitches were elicited. Pt then completed quad/LE strengthening therex for muscle activiation. Pt tolerated PT today without adverse effects. Will assess pt's full response to today's PT session when he returns for his next PT visit.    OBJECTIVE IMPAIRMENTS: decreased activity tolerance, difficulty walking, decreased strength, increased muscle spasms, and pain.    ACTIVITY LIMITATIONS: carrying, lifting, bending, sitting, standing, squatting, stairs, and locomotion level   PARTICIPATION LIMITATIONS: cleaning, laundry, and occupation   PERSONAL FACTORS: Past/current experiences, Profession, and Time since onset of injury/illness/exacerbation are also affecting patient's functional outcome.    REHAB POTENTIAL: Good   CLINICAL DECISION MAKING: Stable/uncomplicated   EVALUATION COMPLEXITY: Low     GOALS:   SHORT TERM GOALS: Target date: 03/29/22 Pt will be Ind in an initial HEP  Baseline: initiated  Goal status: INITIAL   2.  Pt will voice understanding of measures to assist in pain reduction  Baseline: initiated Goal status: INITIAL   LONG TERM GOALS: Target date: 05/03/22   Pt will be Ind in a final HEP to maintain achieved LOF  Baseline: initiated Goal status: INITIAL   2.  Increase bilat hip strength to 5/5 for improved lower body  Baseline: see flow sheets Goal status: INITIAL   3.  Improve 5xSTS by MCID of 5" as indication of improved functional mobility  Baseline: 20.6 s use of hands Goal status: INITIAL   4.  Pt's LEFS score will improved to 70% or greater as indication of improved function  Baseline: 48% Goal status: INITIAL   5.  Pt will report a decrease in bilat  knee pain to 3/10 of less intermittently for improve function of the Legs with ADLs and his work with home improvement Baseline: 7/10 Goal status: INITIAL   PLAN:   PT FREQUENCY: 2x/week   PT DURATION: 6 weeks   PLANNED INTERVENTIONS: Therapeutic exercises, Therapeutic activity, Balance training, Gait training, Patient/Family education, Self Care, Joint mobilization, Stair training, Aquatic Therapy, Dry Needling, Electrical stimulation, Cryotherapy, Moist heat, Taping, Vasopneumatic device, Ultrasound, Ionotophoresis 23m/ml Dexamethasone, Manual therapy, and Re-evaluation   PLAN FOR NEXT SESSION: Review LEFS; assess hamstring flexibility and Thomas test; assess response to HEP; progress therex as indicated; use of modalities, manual therapy; and TPDN as indicated.   AGar Ponto PT 03/21/2022, 2:11 PM

## 2022-03-21 ENCOUNTER — Ambulatory Visit: Payer: Medicaid Other

## 2022-03-21 DIAGNOSIS — M6281 Muscle weakness (generalized): Secondary | ICD-10-CM

## 2022-03-21 DIAGNOSIS — M5459 Other low back pain: Secondary | ICD-10-CM

## 2022-03-21 DIAGNOSIS — M25561 Pain in right knee: Secondary | ICD-10-CM | POA: Diagnosis not present

## 2022-03-21 DIAGNOSIS — R252 Cramp and spasm: Secondary | ICD-10-CM | POA: Diagnosis not present

## 2022-03-21 DIAGNOSIS — M25562 Pain in left knee: Secondary | ICD-10-CM | POA: Diagnosis not present

## 2022-03-21 DIAGNOSIS — M170 Bilateral primary osteoarthritis of knee: Secondary | ICD-10-CM | POA: Diagnosis not present

## 2022-03-21 NOTE — Patient Instructions (Signed)

## 2022-03-22 ENCOUNTER — Encounter: Payer: Medicaid Other | Admitting: Student

## 2022-03-22 NOTE — Progress Notes (Signed)
Internal Medicine Clinic Attending  Case discussed with Dr. Harper  At the time of the visit.  We reviewed the resident's history and exam and pertinent patient test results.  I agree with the assessment, diagnosis, and plan of care documented in the resident's note.  

## 2022-03-26 ENCOUNTER — Ambulatory Visit: Payer: Medicaid Other | Admitting: Physical Therapy

## 2022-03-26 ENCOUNTER — Encounter: Payer: Self-pay | Admitting: Physical Therapy

## 2022-03-26 DIAGNOSIS — M25561 Pain in right knee: Secondary | ICD-10-CM

## 2022-03-26 DIAGNOSIS — M6281 Muscle weakness (generalized): Secondary | ICD-10-CM

## 2022-03-26 DIAGNOSIS — R252 Cramp and spasm: Secondary | ICD-10-CM | POA: Diagnosis not present

## 2022-03-26 DIAGNOSIS — M25562 Pain in left knee: Secondary | ICD-10-CM | POA: Diagnosis not present

## 2022-03-26 DIAGNOSIS — M170 Bilateral primary osteoarthritis of knee: Secondary | ICD-10-CM | POA: Diagnosis not present

## 2022-03-26 DIAGNOSIS — M5459 Other low back pain: Secondary | ICD-10-CM | POA: Diagnosis not present

## 2022-03-26 NOTE — Therapy (Signed)
OUTPATIENT PHYSICAL THERAPY TREATMENT NOTE   Patient Name: Chad Little MRN: [id number] DOB:[birth year], 62 y.o., male Today's Date: 03/26/2022  PCP: Serita Butcher, MD  REFERRING PROVIDER: Lucious Groves, DO   END OF SESSION:   PT End of Session - 03/26/22 0809     Visit Number 3    Number of Visits 13    Date for PT Re-Evaluation 05/03/22    Authorization Type [place] MEDICAID [facility] 6 visits    PT Start Time 0808    PT Stop Time 0847    PT Time Calculation (min) 39 min             Past Medical History:  Diagnosis Date   Arthritis    Enlarged prostate    Essential hypertension    GERD (gastroesophageal reflux disease)    Gout    diet controlled no recent flare ups   HIV infection ([place])    Superficial vein thrombosis 08/08/2020   Past Surgical History:  Procedure Laterality Date   APPENDECTOMY  2005   open   Right elbow surgery  dec3 2020   in [place]   TRANSURETHRAL RESECTION OF PROSTATE N/A 02/14/2020   Procedure: TRANSURETHRAL RESECTION OF THE PROSTATE (TURP), BIPOLAR;  Surgeon: Janith Lima, MD;  Location: [facility];  Service: Urology;  Laterality: N/A;   Patient Active Problem List   Diagnosis Date Noted   Fatigue 03/05/2022   Seborrheic dermatitis 12/21/2021   Medial epicondylitis of left elbow 12/21/2021   Blurry vision, left eye 09/20/2021   Erectile dysfunction 08/15/2021   Lightheadedness 08/10/2021   Varicose veins of left lower extremity 08/10/2021   Arthritis of carpometacarpal (CMC) joint of left thumb 07/13/2021   Nail dystrophy 06/08/2021   Thoracic back pain 05/02/2021   Thumb pain, left 01/15/2021   Sprain of tibiofibular ligament of left ankle 01/15/2021   BPPV (benign paroxysmal positional vertigo), bilateral 10/23/2020   Seasonal allergies 07/06/2020   GERD (gastroesophageal reflux disease) 06/13/2020   S/P TURP 03/22/2020   Healthcare maintenance 05/25/2019   Prediabetes 04/14/2019   Episodic  tension-type headache, not intractable 09/30/2015   Hypertension 10/31/2014   Onychomycosis 12/11/2011   HIV disease ([place]) 12/28/2007   Osteoarthritis 12/28/2007    REFERRING DIAG: M17.0 (ICD-10-CM) - Osteoarthritis of both knees, unspecified osteoarthritis type   THERAPY DIAG:  Acute bilateral knee pain  Muscle weakness (generalized)  Cramp and spasm  Other low back pain  Rationale for Evaluation and Treatment Rehabilitation  ONSET DATE: 03/01/22   SUBJECTIVE:    SUBJECTIVE STATEMENT: Pt reports both knees are aching due to work yesterday, lots of up and down.   PAIN:  Are you having pain? Yes: NPRS scale: 6/10 Pain location: B knees  Pain description: sharp, throb, ache constant Aggravating factors: All activities Relieving factors: Voltaren and ibuprofen,  10/10 when it occurred    PERTINENT HISTORY: OA, HIV, Hx gout   PRECAUTIONS: HIV   WEIGHT BEARING RESTRICTIONS: No   FALLS:  Has patient fallen in last 6 months? 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 05/15/11-Mild narrowing of medial joint compartment.  No joint effusion.    PATIENT SURVEYS:  LEFS 39, 48%   COGNITION: Overall cognitive status: Within functional limits for tasks assessed                         SENSATION: WFL   EDEMA:  None observed or palpated   MUSCLE LENGTH: 03/26/22: Hamstrings: Right 68 deg; Left TBA 72 03/26/22: Thomas test: Right deg; Left 10 deg 03/26/22: Prone quad: Right 108 deg: Left 98 deg    POSTURE: No Significant postural limitations   PALPATION: TTP medial and lateral patella's bilat and taut muscle bands vastus lateralis bilat    LOWER EXTREMITY ROM:   Active ROM Right eval Left eval  Hip flexion      Hip extension      Hip abduction      Hip adduction      Hip internal rotation      Hip external rotation      Knee flexion      Knee extension      Ankle dorsiflexion      Ankle plantarflexion      Ankle inversion      Ankle eversion       (Blank rows = not tested)   LOWER EXTREMITY MMT:   MMT Right eval Left eval  Hip flexion      Hip extension      Hip abduction      Hip adduction      Hip internal rotation      Hip external rotation      Knee flexion 0 0  Knee extension 125 125  Ankle dorsiflexion      Ankle plantarflexion      Ankle inversion      Ankle eversion       (Blank rows = not tested)   LOWER EXTREMITY SPECIAL TESTS:  Knee special tests: McMurray's test: negative, Patellafemoral apprehension test: negative, Patellafemoral grind test: negative, and Patella tap test (ballotable patella): negative   FUNCTIONAL TESTS:  5 times sit to stand: 20.6"    GAIT: Distance walked: 200' Assistive device utilized: None Level of assistance: Complete Independence Comments: WNLs' for distanced walked. Pt reports an antalgic gait pattern with prolonged walking or activity   TODAY'S TREATMENT:   [facility] Adult PT Treatment:                                                DATE: 03/26/22 Therapeutic Exercise: Rec bike L2 x 5 minutes Prone quad stretch with strap 2 x 30 sec each  Qs into towel x 10 each  SLR x 10 each, then with 2# x 10 each  Side hip abduction x 15 each  Hip flexor stretch EOM with strap, left Manual Therapy: Medial patella creep , bilat    [facility] Adult PT Treatment:                                                DATE: 03/21/22 Therapeutic Exercise: SLR c quad set 2x10 3" each Hip abd 2x10 3" each Hip ER BluTB 2x10 3" each LAQ 2x10 3" each STS x10 Manual Therapy: STM/DTM to the L and R quads Skilled palpation to identify TrPs and taut muscle bands Trigger Point  Dry  Needling Treatment: Pre-treatment instruction: Patient instructed on dry needling rationale, procedures, and possible side effects including pain during treatment (achy,cramping feeling), bruising, drop of blood, lightheadedness, nausea, sweating. Patient Consent Given: Yes Education handout provided: Yes Muscles treated: L and R vastus lateralis  Needle size and number: .30x66m x 2 Electrical stimulation performed: No Parameters:  NA Treatment response/outcome: Twitch response elicited Post-treatment instructions: Patient instructed to expect possible mild to moderate muscle soreness later today and/or tomorrow. Patient instructed in methods to reduce muscle soreness and to continue prescribed HEP. If patient was dry needled over the lung field, patient was instructed on signs and symptoms of pneumothorax and, however unlikely, to see immediate medical attention should they occur. Patient was also educated on signs and symptoms of infection and to seek medical attention should they occur. Patient verbalized understanding of these instructions and education.                                                                                                                               [facility]                                                [facility]: [facility] Therapeutic Exercise: Developed, instructed in, and pt completed therex as noted in HEP  Self Care: Use of cold packs 10-15 mins for symptom management   PATIENT EDUCATION:  Education details: Eval findings, POC, HEP, self care  Person educated: Patient Education method: Explanation, Demonstration, Tactile cues, Verbal cues, and Handouts Education comprehension: verbalized understanding, returned demonstration, verbal cues required, and tactile cues required   HOME EXERCISE PROGRAM: Access Code: [email or web address] URL: [email or web address] Date: 03/21/2022 Prepared by: AGar Ponto Exercises - Active Straight Leg Raise  with Quad Set  - 1 x daily - 7 x weekly - 2 sets - 10 reps - 3 hold - Sidelying Hip Abduction  - 1 x daily - 7 x weekly - 2 sets - 10 reps - 3 hold - Hooklying Clamshell with Resistance  - 1 x daily - 7 x weekly - 2 sets - 10 reps - 3 hold - Seated Long Arc Quad  - 1 x daily - 7 x weekly - 2 sets - 10 reps - 3 hold - Sit to Stand Without Arm Support  - 1 x daily - 7 x weekly - 1 sets - 10 reps - 3 hold Added 03/26/22 - Supine Quadriceps Stretch with Strap on Table  - 1 x daily - 7 x weekly - 1 sets - 3 reps - 30 hold   ASSESSMENT:   CLINICAL IMPRESSION: Pt returns to PT reporting improvement in pain following TPDN. His pain is elevated today due to the work he performed yesterday which required a lot of getting up and down. Reviewed HEP and  progressed with quad and hip flexor flexibility. He is challenged to complete 15 reps of hip abduction in clinic.  Pt tolerated PT today without adverse effects. Will assess pt's full response to today's PT session when he returns for his next PT visit.    OBJECTIVE IMPAIRMENTS: decreased activity tolerance, difficulty walking, decreased strength, increased muscle spasms, and pain.    ACTIVITY LIMITATIONS: carrying, lifting, bending, sitting, standing, squatting, stairs, and locomotion level   PARTICIPATION LIMITATIONS: cleaning, laundry, and occupation   PERSONAL FACTORS: Past/current experiences, Profession, and Time since onset of injury/illness/exacerbation are also affecting patient's functional outcome.    REHAB POTENTIAL: Good   CLINICAL DECISION MAKING: Stable/uncomplicated   EVALUATION COMPLEXITY: Low     GOALS:   SHORT TERM GOALS: Target date: 03/29/22 Pt will be Ind in an initial HEP  Baseline: initiated Goal status: INITIAL   2.  Pt will voice understanding of measures to assist in pain reduction  Baseline: initiated Goal status: INITIAL   LONG TERM GOALS: Target date: 05/03/22   Pt will be Ind in a final HEP to maintain achieved  LOF  Baseline: initiated Goal status: INITIAL   2.  Increase bilat hip strength to 5/5 for improved lower body  Baseline: see flow sheets Goal status: INITIAL   3.  Improve 5xSTS by MCID of 5" as indication of improved functional mobility  Baseline: 20.6 s use of hands Goal status: INITIAL   4.  Pt's LEFS score will improved to 70% or greater as indication of improved function  Baseline: 48% Goal status: INITIAL   5.  Pt will report a decrease in bilat knee pain to 3/10 of less intermittently for improve function of the Legs with ADLs and his work with home improvement Baseline: 7/10 Goal status: INITIAL   PLAN:   PT FREQUENCY: 2x/week   PT DURATION: 6 weeks   PLANNED INTERVENTIONS: Therapeutic exercises, Therapeutic activity, Balance training, Gait training, Patient/Family education, Self Care, Joint mobilization, Stair training, Aquatic Therapy, Dry Needling, Electrical stimulation, Cryotherapy, Moist heat, Taping, Vasopneumatic device, Ultrasound, Ionotophoresis 80m/ml Dexamethasone, Manual therapy, and Re-evaluation   PLAN FOR NEXT SESSION: Review LEFS; assess response to HEP; progress therex as indicated; use of modalities, manual therapy; and TPDN as indicated.   JHessie Diener PTA 03/26/22 8:54 AM Phone: [phone]: [phone]

## 2022-03-27 NOTE — Therapy (Signed)
OUTPATIENT PHYSICAL THERAPY TREATMENT NOTE   Patient Name: Chad Little MRN: [id number] DOB:[birth year], 62 y.o., male Today's Date: 03/28/2022  PCP: Serita Butcher, MD  REFERRING PROVIDER: Lucious Groves, DO   END OF SESSION:   PT End of Session - 03/28/22 0847     Visit Number 4    Number of Visits 13    Date for PT Re-Evaluation 05/03/22    Authorization Type [place] MEDICAID [facility] 6 visits    PT Start Time 0804    PT Stop Time 0850    PT Time Calculation (min) 46 min    Activity Tolerance Patient tolerated treatment well    Behavior During Therapy [facility] for tasks assessed/performed              Past Medical History:  Diagnosis Date   Arthritis    Enlarged prostate    Essential hypertension    GERD (gastroesophageal reflux disease)    Gout    diet controlled no recent flare ups   HIV infection ([place])    Superficial vein thrombosis 08/08/2020   Past Surgical History:  Procedure Laterality Date   APPENDECTOMY  2005   open   Right elbow surgery  dec3 2020   in [place]   TRANSURETHRAL RESECTION OF PROSTATE N/A 02/14/2020   Procedure: TRANSURETHRAL RESECTION OF THE PROSTATE (TURP), BIPOLAR;  Surgeon: Janith Lima, MD;  Location: [facility];  Service: Urology;  Laterality: N/A;   Patient Active Problem List   Diagnosis Date Noted   Fatigue 03/05/2022   Seborrheic dermatitis 12/21/2021   Medial epicondylitis of left elbow 12/21/2021   Blurry vision, left eye 09/20/2021   Erectile dysfunction 08/15/2021   Lightheadedness 08/10/2021   Varicose veins of left lower extremity 08/10/2021   Arthritis of carpometacarpal (CMC) joint of left thumb 07/13/2021   Nail dystrophy 06/08/2021   Thoracic back pain 05/02/2021   Thumb pain, left 01/15/2021   Sprain of tibiofibular ligament of left ankle 01/15/2021   BPPV (benign paroxysmal positional vertigo), bilateral 10/23/2020   Seasonal allergies 07/06/2020   GERD (gastroesophageal reflux disease)  06/13/2020   S/P TURP 03/22/2020   Healthcare maintenance 05/25/2019   Prediabetes 04/14/2019   Episodic tension-type headache, not intractable 09/30/2015   Hypertension 10/31/2014   Onychomycosis 12/11/2011   HIV disease ([place]) 12/28/2007   Osteoarthritis 12/28/2007    REFERRING DIAG: M17.0 (ICD-10-CM) - Osteoarthritis of both knees, unspecified osteoarthritis type   THERAPY DIAG:  Acute bilateral knee pain  Muscle weakness (generalized)  Cramp and spasm  Other low back pain  Rationale for Evaluation and Treatment Rehabilitation  ONSET DATE: 03/01/22   SUBJECTIVE:    SUBJECTIVE STATEMENT: Pt reports both knees are feeling a better today. Activity level was low yesterday.  PAIN:  Are you having pain? Yes: NPRS scale: 4/10 Pain location: B knees  Pain description: sharp, throb, ache constant Aggravating factors: All activities Relieving factors: Voltaren and ibuprofen,  10/10 when it occurred    PERTINENT HISTORY: OA, HIV, Hx gout   PRECAUTIONS: HIV   WEIGHT BEARING RESTRICTIONS: No   FALLS:  Has patient fallen in last 6 months? 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 05/15/11-Mild narrowing of medial joint compartment.  No joint effusion.    PATIENT SURVEYS:  LEFS 39, 48%   COGNITION: Overall cognitive status: Within functional limits for tasks assessed                         SENSATION: WFL   EDEMA:  None observed or palpated   MUSCLE LENGTH: 03/26/22: Hamstrings: Right 68 deg; Left TBA 72 03/26/22: Thomas test: Right deg; Left 10 deg 03/26/22: Prone quad: Right 108 deg: Left 98 deg    POSTURE: No Significant postural  limitations   PALPATION: TTP medial and lateral patella's bilat and taut muscle bands vastus lateralis bilat   LOWER EXTREMITY ROM:   Active ROM Right eval Left eval  Hip flexion      Hip extension      Hip abduction      Hip adduction      Hip internal rotation      Hip external rotation      Knee flexion      Knee extension      Ankle dorsiflexion      Ankle plantarflexion      Ankle inversion      Ankle eversion       (Blank rows = not tested)   LOWER EXTREMITY MMT:   MMT Right eval Left eval  Hip flexion      Hip extension      Hip abduction      Hip adduction      Hip internal rotation      Hip external rotation      Knee flexion 0 0  Knee extension 125 125  Ankle dorsiflexion      Ankle plantarflexion      Ankle inversion      Ankle eversion       (Blank rows = not tested)   LOWER EXTREMITY SPECIAL TESTS:  Knee special tests: McMurray's test: negative, Patellafemoral apprehension test: negative, Patellafemoral grind test: negative, and Patella tap test (ballotable patella): negative   FUNCTIONAL TESTS:  5 times sit to stand: 20.6"    GAIT: Distance walked: 200' Assistive device utilized: None Level of assistance: Complete Independence Comments: WNLs' for distanced walked. Pt reports an antalgic gait pattern with prolonged walking or activity   TODAY'S TREATMENT: [facility] Adult PT Treatment:                                                DATE: 03/2222 Therapeutic Exercise: Nustep 5 mins L5 LE Seated hamstring stretch x2 30" each Prone quad stretch x2 30" Knee ext omega 2x10 15# Leg press 2x10 60# Manual Therapy: STM/DTM to the L and R quads Skilled palpation to identify TrPs and taut muscle bands  Trigger Point Dry Needling Treatment: Pre-treatment instruction: Patient instructed on dry needling rationale, procedures, and possible side effects including pain during treatment (achy,cramping feeling), bruising, drop of blood, lightheadedness, nausea,  sweating. Patient Consent Given: Yes Education handout provided: Yes Muscles treated: L and R vastus lateralis  Needle size and number: .30x57m x 2 Electrical stimulation performed: No Parameters: NA Treatment response/outcome: Twitch response elicited Post-treatment instructions: Patient instructed to expect possible mild to moderate muscle soreness later today and/or tomorrow. Patient instructed in methods to reduce muscle soreness and to continue prescribed HEP. If patient  was dry needled over the lung field, patient was instructed on signs and symptoms of pneumothorax and, however unlikely, to see immediate medical attention should they occur. Patient was also educated on signs and symptoms of infection and to seek medical attention should they occur. Patient verbalized understanding of these instructions and education.     [facility] Adult PT Treatment:                                                DATE: 03/26/22 Therapeutic Exercise: Rec bike L2 x 5 minutes Prone quad stretch with strap 2 x 30 sec each  Qs into towel x 10 each  SLR x 10 each, then with 2# x 10 each  Side hip abduction x 15 each  Hip flexor stretch EOM with strap, left Manual Therapy: Medial patella creep , bilat  [facility] Adult PT Treatment:                                                DATE: 03/21/22 Therapeutic Exercise: SLR c quad set 2x10 3" each Hip abd 2x10 3" each Hip ER BluTB 2x10 3" each LAQ 2x10 3" each STS x10 Manual Therapy: STM/DTM to the L and R quads Skilled palpation to identify TrPs and taut muscle bands Trigger Point Dry Needling Treatment: Pre-treatment instruction: Patient instructed on dry needling rationale, procedures, and possible side effects including pain during treatment (achy,cramping feeling), bruising, drop of blood, lightheadedness, nausea, sweating. Patient Consent Given: Yes Education handout provided: Yes Muscles treated: L and R vastus lateralis  Needle size and number: .30x60m x  2 Electrical stimulation performed: No Parameters:  NA Treatment response/outcome: Twitch response elicited Post-treatment instructions: Patient instructed to expect possible mild to moderate muscle soreness later today and/or tomorrow. Patient instructed in methods to reduce muscle soreness and to continue prescribed HEP. If patient was dry needled over the lung field, patient was instructed on signs and symptoms of pneumothorax and, however unlikely, to see immediate medical attention should they occur. Patient was also educated on signs and symptoms of infection and to seek medical attention should they occur. Patient verbalized understanding of these instructions and education.                                                                                                                               [facility]                                                [facility]: [facility] Therapeutic Exercise: Developed, instructed  in, and pt completed therex as noted in HEP  Self Care: Use of cold packs 10-15 mins for symptom management   PATIENT EDUCATION:  Education details: Eval findings, POC, HEP, self care  Person educated: Patient Education method: Explanation, Demonstration, Tactile cues, Verbal cues, and Handouts Education comprehension: verbalized understanding, returned demonstration, verbal cues required, and tactile cues required   HOME EXERCISE PROGRAM: Access Code: [email or web address] URL: [email or web address] Date: 03/21/2022 Prepared by: Gar Ponto  Exercises - Active Straight Leg Raise with Quad Set  - 1 x daily - 7 x weekly - 2 sets - 10 reps - 3 hold - Sidelying Hip Abduction  - 1 x daily - 7 x weekly - 2 sets - 10 reps - 3 hold - Hooklying Clamshell with Resistance  - 1 x daily - 7 x weekly - 2 sets - 10 reps - 3 hold - Seated Long Arc Quad  - 1 x daily - 7 x weekly - 2 sets - 10 reps - 3 hold - Sit to Stand Without Arm Support  - 1 x daily - 7 x weekly - 1  sets - 10 reps - 3 hold Added 03/26/22 - Supine Quadriceps Stretch with Strap on Table  - 1 x daily - 7 x weekly - 1 sets - 3 reps - 30 hold   ASSESSMENT:   CLINICAL IMPRESSION: Overall, pt's knee pain is improved. Intensity of pain can be dependent on physical activity/stress level of his work. PT was provided for STM/DTM to the bilat quads f/b TPDN to the vastus lateralis. Pt then completed flexibility and strengthening therex for the quads/legs. Pt tolerated PT today without adverse effects. Pt will continue to benefit from skilled PT to address impairments for improved function. Will assess pt's complete response to the TPDN his next PT session. Reassess 5xSTS the next session.     OBJECTIVE IMPAIRMENTS: decreased activity tolerance, difficulty walking, decreased strength, increased muscle spasms, and pain.    ACTIVITY LIMITATIONS: carrying, lifting, bending, sitting, standing, squatting, stairs, and locomotion level   PARTICIPATION LIMITATIONS: cleaning, laundry, and occupation   PERSONAL FACTORS: Past/current experiences, Profession, and Time since onset of injury/illness/exacerbation are also affecting patient's functional outcome.    REHAB POTENTIAL: Good   CLINICAL DECISION MAKING: Stable/uncomplicated   EVALUATION COMPLEXITY: Low     GOALS:   SHORT TERM GOALS: Target date: 03/29/22 Pt will be Ind in an initial HEP  Baseline: initiated Status: pt had no questions about his current program Goal status: Ongoing   2.  Pt will voice understanding of measures to assist in pain reduction  Baseline: initiated Status: Goal status: Ongoing   LONG TERM GOALS: Target date: 05/03/22   Pt will be Ind in a final HEP to maintain achieved LOF  Baseline: initiated Goal status: INITIAL   2.  Increase bilat hip strength to 5/5 for improved lower body  Baseline: see flow sheets Goal status: INITIAL   3.  Improve 5xSTS by MCID of 5" as indication of improved functional mobility   Baseline: 20.6 s use of hands Goal status: INITIAL   4.  Pt's LEFS score will improved to 70% or greater as indication of improved function  Baseline: 48% Goal status: INITIAL   5.  Pt will report a decrease in bilat knee pain to 3/10 of less intermittently for improve function of the Legs with ADLs and his work with home improvement Baseline: 7/10 Goal status: INITIAL   PLAN:   PT FREQUENCY: 2x/week   PT  DURATION: 6 weeks   PLANNED INTERVENTIONS: Therapeutic exercises, Therapeutic activity, Balance training, Gait training, Patient/Family education, Self Care, Joint mobilization, Stair training, Aquatic Therapy, Dry Needling, Electrical stimulation, Cryotherapy, Moist heat, Taping, Vasopneumatic device, Ultrasound, Ionotophoresis 85m/ml Dexamethasone, Manual therapy, and Re-evaluation   PLAN FOR NEXT SESSION: Review LEFS; assess response to HEP; progress therex as indicated; use of modalities, manual therapy; and TPDN as indicated.  Gala Padovano MS, PT 03/28/22 12:45 PM

## 2022-03-28 ENCOUNTER — Ambulatory Visit: Payer: Medicaid Other

## 2022-03-28 DIAGNOSIS — M25561 Pain in right knee: Secondary | ICD-10-CM | POA: Diagnosis not present

## 2022-03-28 DIAGNOSIS — M25562 Pain in left knee: Secondary | ICD-10-CM

## 2022-03-28 DIAGNOSIS — M5459 Other low back pain: Secondary | ICD-10-CM | POA: Diagnosis not present

## 2022-03-28 DIAGNOSIS — M170 Bilateral primary osteoarthritis of knee: Secondary | ICD-10-CM | POA: Diagnosis not present

## 2022-03-28 DIAGNOSIS — M6281 Muscle weakness (generalized): Secondary | ICD-10-CM

## 2022-03-28 DIAGNOSIS — R252 Cramp and spasm: Secondary | ICD-10-CM | POA: Diagnosis not present

## 2022-04-02 ENCOUNTER — Ambulatory Visit: Payer: Medicaid Other

## 2022-04-02 ENCOUNTER — Ambulatory Visit: Payer: Medicaid Other | Admitting: Podiatry

## 2022-04-02 DIAGNOSIS — M170 Bilateral primary osteoarthritis of knee: Secondary | ICD-10-CM | POA: Diagnosis not present

## 2022-04-02 DIAGNOSIS — R252 Cramp and spasm: Secondary | ICD-10-CM | POA: Diagnosis not present

## 2022-04-02 DIAGNOSIS — M79675 Pain in left toe(s): Secondary | ICD-10-CM | POA: Diagnosis not present

## 2022-04-02 DIAGNOSIS — M25561 Pain in right knee: Secondary | ICD-10-CM

## 2022-04-02 DIAGNOSIS — B351 Tinea unguium: Secondary | ICD-10-CM | POA: Diagnosis not present

## 2022-04-02 DIAGNOSIS — M79674 Pain in right toe(s): Secondary | ICD-10-CM | POA: Diagnosis not present

## 2022-04-02 DIAGNOSIS — M5459 Other low back pain: Secondary | ICD-10-CM | POA: Diagnosis not present

## 2022-04-02 DIAGNOSIS — M6281 Muscle weakness (generalized): Secondary | ICD-10-CM

## 2022-04-02 DIAGNOSIS — M25562 Pain in left knee: Secondary | ICD-10-CM | POA: Diagnosis not present

## 2022-04-02 NOTE — Progress Notes (Signed)
  Subjective:  Patient ID: Chad Little, male    DOB: [birth year],  MRN: [id number]  Chad Little presents to clinic today for  painful dystrophic nails b/l great toes.   Chief Complaint  Patient presents with   Nail Problem    Nail trim    New problem(s): None.   PCP is Serita Butcher, MD.  No Known Allergies  Review of Systems: Negative except as noted in the HPI.  Objective: No changes noted in today's physical examination.  Chad Little is a pleasant 62 y.o. male in NAD. AAO x 3.  Vascular Examination: Capillary refill time immediate b/l.Vascular status intact b/l with palpable pedal pulses. Pedal hair present b/l. No edema. No pain with calf compression b/l. Skin temperature gradient WNL b/l. No ischemia or gangrene noted b/l LE. No cyanosis or clubbing noted b/l LE.  Neurological Examination: Sensation grossly intact b/l with 10 gram monofilament. Vibratory sensation intact b/l.   Dermatological Examination: Pedal skin with normal turgor, texture and tone b/l.  Bilateral great toes thick, discolored debris with pain on dorsal palpation. No open wounds b/l LE. No interdigital macerations noted b/l LE. Nondystrophic toenails 2-5 bilaterally.  Musculoskeletal Examination: Normal muscle strength 5/5 to all lower extremity muscle groups bilaterally. No pain, crepitus or joint limitation noted with ROM b/l LE. No gross bony pedal deformities b/l. Patient ambulates independently without assistive aids.  Radiographs: None  Last A1c:      Latest Ref Rng & Units 03/05/2022   10:21 AM 10/23/2021    9:52 AM 09/20/2021   10:33 AM  Hemoglobin A1C  Hemoglobin-A1c 4.0 - 5.6 % 6.3  6.0  6.2    Assessment/Plan: 1. Pain due to onychomycosis of toenails of both feet      No orders of the defined types were placed in this encounter.   -Patient was evaluated and treated. All patient's and/or POA's questions/concerns answered on today's visit. -Dystrophic nails  debrided in length and girth with sterile tissue nippers and dremel. -Patient to continue soft, supportive shoe gear daily. -Patient/POA to call should there be question/concern in the interim.   No follow-ups on file.  Felipa Furnace, DPM

## 2022-04-02 NOTE — Therapy (Signed)
OUTPATIENT PHYSICAL THERAPY TREATMENT NOTE   Patient Name: Chad Little MRN: [id number] DOB:1960/03/22, 62 y.o., male Today's Date: 04/02/2022  PCP: Serita Butcher, MD  REFERRING PROVIDER: Lucious Groves, DO   END OF SESSION:   PT End of Session - 04/02/22 0814     Visit Number 5    Number of Visits 13    Date for PT Re-Evaluation 05/03/22    Authorization Type [place] MEDICAID [facility] 6 visits    Authorization - Visit Number 4    Authorization - Number of Visits 6    PT Start Time 0805    PT Stop Time 0845    PT Time Calculation (min) 40 min    Activity Tolerance Patient tolerated treatment well    Behavior During Therapy [facility] for tasks assessed/performed               Past Medical History:  Diagnosis Date   Arthritis    Enlarged prostate    Essential hypertension    GERD (gastroesophageal reflux disease)    Gout    diet controlled no recent flare ups   HIV infection ([place])    Superficial vein thrombosis 08/08/2020   Past Surgical History:  Procedure Laterality Date   APPENDECTOMY  2005   open   Right elbow surgery  dec3 2020   in [place]   TRANSURETHRAL RESECTION OF PROSTATE N/A 02/14/2020   Procedure: TRANSURETHRAL RESECTION OF THE PROSTATE (TURP), BIPOLAR;  Surgeon: Janith Lima, MD;  Location: [facility];  Service: Urology;  Laterality: N/A;   Patient Active Problem List   Diagnosis Date Noted   Fatigue 03/05/2022   Seborrheic dermatitis 12/21/2021   Medial epicondylitis of left elbow 12/21/2021   Blurry vision, left eye 09/20/2021   Erectile dysfunction 08/15/2021   Lightheadedness 08/10/2021   Varicose veins of left lower extremity 08/10/2021   Arthritis of carpometacarpal (CMC) joint of left thumb 07/13/2021   Nail dystrophy 06/08/2021   Thoracic back pain 05/02/2021   Thumb pain, left 01/15/2021   Sprain of tibiofibular ligament of left ankle 01/15/2021   BPPV (benign paroxysmal positional vertigo), bilateral  10/23/2020   Seasonal allergies 07/06/2020   GERD (gastroesophageal reflux disease) 06/13/2020   S/P TURP 03/22/2020   Healthcare maintenance 05/25/2019   Prediabetes 04/14/2019   Episodic tension-type headache, not intractable 09/30/2015   Hypertension 10/31/2014   Onychomycosis 12/11/2011   HIV disease ([place]) 12/28/2007   Osteoarthritis 12/28/2007    REFERRING DIAG: M17.0 (ICD-10-CM) - Osteoarthritis of both knees, unspecified osteoarthritis type   THERAPY DIAG:  Acute bilateral knee pain  Muscle weakness (generalized)  Cramp and spasm  Other low back pain  Rationale for Evaluation and Treatment Rehabilitation  ONSET DATE: 03/01/22   SUBJECTIVE:    SUBJECTIVE STATEMENT: Pt reports with working the past 2 days on his knees and getting up/down and his knees are hurting more.[name]  PAIN:  Are you having pain? Yes: NPRS scale: 8/10 with STS s use of hands. 4/10 walking, 0/10 Pain location: B knees  Pain description: sharp, throb, ache constant Aggravating factors: All activities Relieving factors: Voltaren and ibuprofen,  10/10 when it occurred    PERTINENT HISTORY: OA, HIV, Hx gout   PRECAUTIONS: HIV   WEIGHT BEARING RESTRICTIONS: No   FALLS:  Has patient fallen in last 6 months? 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 05/15/11-Mild narrowing of medial joint compartment.  No joint effusion.    PATIENT SURVEYS:  LEFS 39, 48%   COGNITION: Overall cognitive status: Within functional limits for tasks assessed                         SENSATION: WFL   EDEMA:  None observed or palpated   MUSCLE LENGTH: 03/26/22: Hamstrings: Right  68 deg; Left TBA 72 03/26/22: Thomas test: Right deg; Left 10 deg 03/26/22: Prone quad: Right 108 deg: Left 98 deg    POSTURE: No Significant postural limitations   PALPATION: TTP medial and lateral patella's bilat and taut muscle bands vastus lateralis bilat   LOWER EXTREMITY ROM:   Active ROM Right eval Left eval  Hip flexion      Hip extension      Hip abduction      Hip adduction      Hip internal rotation      Hip external rotation      Knee flexion      Knee extension      Ankle dorsiflexion      Ankle plantarflexion      Ankle inversion      Ankle eversion       (Blank rows = not tested)   LOWER EXTREMITY MMT:   MMT Right eval Left eval  Hip flexion      Hip extension      Hip abduction      Hip adduction      Hip internal rotation      Hip external rotation      Knee flexion 0 0  Knee extension 125 125  Ankle dorsiflexion      Ankle plantarflexion      Ankle inversion      Ankle eversion       (Blank rows = not tested)   LOWER EXTREMITY SPECIAL TESTS:  Knee special tests: McMurray's test: negative, Patellafemoral apprehension test: negative, Patellafemoral grind test: negative, and Patella tap test (ballotable patella): negative   FUNCTIONAL TESTS:  5 times sit to stand: 20.6"    GAIT: Distance walked: 200' Assistive device utilized: None Level of assistance: Complete Independence Comments: WNLs' for distanced walked. Pt reports an antalgic gait pattern with prolonged walking or activity   TODAY'S TREATMENT: [facility] Adult PT Treatment:                                                DATE: 04/02/22 Therapeutic Exercise: Rec bike L4 x 5 minutes SLR c QS 2x10 each Side hip abduction 2x10 each  Supine clams BluTB 2x10 LAQ c ball squeeze 2x10 3# STS mat table c 2 airex, x5 s hands Prone quad stretch with strap 2 x 30 sec each  Hip flexor stretch standing 2x 30 sec each  [facility] Adult PT Treatment:                                                DATE:  03/28/22 Therapeutic Exercise: Nustep 5 mins L5 LE Seated hamstring stretch x2 30" each Prone quad stretch x2 30" Knee ext omega 2x10 15# Leg press 2x10 60# Manual Therapy: STM/DTM to the L and R quads Skilled palpation to identify TrPs and taut muscle bands  Trigger Point Dry Needling Treatment: Pre-treatment instruction: Patient instructed on dry needling rationale, procedures, and possible side effects including pain during treatment (achy,cramping feeling), bruising, drop of blood, lightheadedness, nausea, sweating. Patient Consent Given: Yes Education handout provided: Yes Muscles treated: L and R vastus lateralis  Needle size and number: .30x64m x 2 Electrical stimulation performed: No Parameters: NA Treatment response/outcome: Twitch response elicited Post-treatment instructions: Patient instructed to expect possible mild to moderate muscle soreness later today and/or tomorrow. Patient instructed in methods to reduce muscle soreness and to continue prescribed HEP. If patient was dry needled over the lung field, patient was instructed on signs and symptoms of pneumothorax and, however unlikely, to see immediate medical attention should they occur. Patient was also educated on signs and symptoms of infection and to seek medical attention should they occur. Patient verbalized understanding of these instructions and education.     [facility]                                                [facility]: 03/[facility]/22 Therapeutic Exercise: Rec bike L2 x 5 minutes Prone quad stretch with strap 2 x 30 sec each  Qs into towel x 10 each  SLR x 10 each, then with 2# x 10 each  Side hip abduction x 15 each  Hip flexor stretch EOM with strap, left Manual Therapy: Medial patella creep , bilat   PATIENT EDUCATION:  Education details: Eval findings, POC, HEP, self care  Person educated: Patient Education method: Explanation, Demonstration, Tactile cues, Verbal cues, and Handouts Education  comprehension: verbalized understanding, returned demonstration, verbal cues required, and tactile cues required   HOME EXERCISE PROGRAM: Access Code: [email or web address] URL: [email or web address] Date: 03/21/2022 Prepared by: AGar Ponto Exercises - Active Straight Leg Raise with Quad Set  - 1 x daily - 7 x weekly - 2 sets - 10 reps - 3 hold - Sidelying Hip Abduction  - 1 x daily - 7 x weekly - 2 sets - 10 reps - 3 hold - Hooklying Clamshell with Resistance  - 1 x daily - 7 x weekly - 2 sets - 10 reps - 3 hold - Seated Long Arc Quad  - 1 x daily - 7 x weekly - 2 sets - 10 reps - 3 hold - Sit to Stand Without Arm Support  - 1 x daily - 7 x weekly - 1 sets - 10 reps - 3 hold Added 03/26/22 - Supine Quadriceps Stretch with Strap on Table  - 1 x daily - 7 x weekly - 1 sets - 3 reps - 30 hold   ASSESSMENT:   CLINICAL IMPRESSION: Pt is experiencing increased bilat knee pain after to 2 days of working on 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 Pt was completed for LE flexibility and strengthening with emphasis on the quads. Strengthening was completed in a manner as to not aggravate his knee pain. Pt tolerated PT today without adverse effects.Pt's knee pain is increased by repetitive loads on his knees with activities where the knee flexion exceeds 90d. Pt was encouraged  to complete HEP to improve his LE/quad strength. Pt tolerated PT today without adverse effects. Pt will continue to benefit from skilled PT to address impairments for improved LE/Knee function with less pain. Reassess 5xSTS the next session.     OBJECTIVE IMPAIRMENTS: decreased activity tolerance, difficulty walking, decreased strength, increased muscle spasms, and pain.    ACTIVITY LIMITATIONS: carrying, lifting, bending, sitting, standing, squatting, stairs, and locomotion level   PARTICIPATION LIMITATIONS: cleaning, laundry, and occupation   PERSONAL FACTORS: Past/current experiences, Profession, and Time  since onset of injury/illness/exacerbation are also affecting patient's functional outcome.    REHAB POTENTIAL: Good   CLINICAL DECISION MAKING: Stable/uncomplicated   EVALUATION COMPLEXITY: Low     GOALS:   SHORT TERM GOALS: Target date: 03/29/22 Pt will be Ind in an initial HEP  Baseline: initiated Status: pt had no questions about his current program Goal status: MET 04/02/22   2.  Pt will voice understanding of measures to assist in pain reduction  Baseline: initiated Status: 04/02/22- using cold packs and completing his HEP Goal status: MET   LONG TERM GOALS: Target date: 05/03/22   Pt will be Ind in a final HEP to maintain achieved LOF  Baseline: initiated Goal status: INITIAL   2.  Increase bilat hip strength to 5/5 for improved lower body  Baseline: see flow sheets Goal status: INITIAL   3.  Improve 5xSTS by MCID of 5" as indication of improved functional mobility  Baseline: 20.6 s use of hands Goal status: INITIAL   4.  Pt's LEFS score will improved to 70% or greater as indication of improved function  Baseline: 48% Goal status: INITIAL   5.  Pt will report a decrease in bilat knee pain to 3/10 of less intermittently for improve function of the Legs with ADLs and his work with home improvement Baseline: 7/10 Goal status: INITIAL   PLAN:   PT FREQUENCY: 2x/week   PT DURATION: 6 weeks   PLANNED INTERVENTIONS: Therapeutic exercises, Therapeutic activity, Balance training, Gait training, Patient/Family education, Self Care, Joint mobilization, Stair training, Aquatic Therapy, Dry Needling, Electrical stimulation, Cryotherapy, Moist heat, Taping, Vasopneumatic device, Ultrasound, Ionotophoresis '4mg'$ /ml Dexamethasone, Manual therapy, and Re-evaluation   PLAN FOR NEXT SESSION: Review LEFS; assess response to HEP; progress therex as indicated; use of modalities, manual therapy; and TPDN as indicated.  Hiro Vipond MS, PT 04/02/22 11:45 AM

## 2022-04-03 DIAGNOSIS — H2513 Age-related nuclear cataract, bilateral: Secondary | ICD-10-CM | POA: Diagnosis not present

## 2022-04-03 DIAGNOSIS — H53002 Unspecified amblyopia, left eye: Secondary | ICD-10-CM | POA: Diagnosis not present

## 2022-04-04 ENCOUNTER — Ambulatory Visit: Payer: Medicaid Other

## 2022-04-05 DIAGNOSIS — Z419 Encounter for procedure for purposes other than remedying health state, unspecified: Secondary | ICD-10-CM | POA: Diagnosis not present

## 2022-04-09 ENCOUNTER — Encounter: Payer: Self-pay | Admitting: Physical Therapy

## 2022-04-09 ENCOUNTER — Ambulatory Visit: Payer: Medicaid Other | Attending: Internal Medicine | Admitting: Physical Therapy

## 2022-04-09 DIAGNOSIS — M25561 Pain in right knee: Secondary | ICD-10-CM | POA: Insufficient documentation

## 2022-04-09 DIAGNOSIS — M5459 Other low back pain: Secondary | ICD-10-CM | POA: Insufficient documentation

## 2022-04-09 DIAGNOSIS — M25562 Pain in left knee: Secondary | ICD-10-CM | POA: Diagnosis not present

## 2022-04-09 DIAGNOSIS — M6281 Muscle weakness (generalized): Secondary | ICD-10-CM | POA: Diagnosis not present

## 2022-04-09 NOTE — Therapy (Signed)
OUTPATIENT PHYSICAL THERAPY TREATMENT NOTE   Patient Name: Chad Little MRN: [id number] DOB:Dec 04, 1960, 62 y.o., male Today's Date: 04/09/2022  PCP: Serita Butcher, MD  REFERRING PROVIDER: Lucious Groves, DO   END OF SESSION:   PT End of Session - 04/09/22 0805     Visit Number 6    Number of Visits 13    Date for PT Re-Evaluation 05/03/22    Authorization Type [place] MEDICAID [facility] 6 visits    Authorization - Visit Number 5    Authorization - Number of Visits 6    PT Start Time 0805    PT Stop Time [id number]    PT Time Calculation (min) 38 min               Past Medical History:  Diagnosis Date   Arthritis    Enlarged prostate    Essential hypertension    GERD (gastroesophageal reflux disease)    Gout    diet controlled no recent flare ups   HIV infection ([place])    Superficial vein thrombosis 08/08/2020   Past Surgical History:  Procedure Laterality Date   APPENDECTOMY  2005   open   Right elbow surgery  dec3 2020   in [place]   TRANSURETHRAL RESECTION OF PROSTATE N/A 02/14/2020   Procedure: TRANSURETHRAL RESECTION OF THE PROSTATE (TURP), BIPOLAR;  Surgeon: Janith Lima, MD;  Location: [facility];  Service: Urology;  Laterality: N/A;   Patient Active Problem List   Diagnosis Date Noted   Fatigue 03/05/2022   Seborrheic dermatitis 12/21/2021   Medial epicondylitis of left elbow 12/21/2021   Blurry vision, left eye 09/20/2021   Erectile dysfunction 08/15/2021   Lightheadedness 08/10/2021   Varicose veins of left lower extremity 08/10/2021   Arthritis of carpometacarpal (CMC) joint of left thumb 07/13/2021   Nail dystrophy 06/08/2021   Thoracic back pain 05/02/2021   Thumb pain, left 01/15/2021   Sprain of tibiofibular ligament of left ankle 01/15/2021   BPPV (benign paroxysmal positional vertigo), bilateral 10/23/2020   Seasonal allergies 07/06/2020   GERD (gastroesophageal reflux disease) 06/13/2020   S/P TURP 03/22/2020    Healthcare maintenance 05/25/2019   Prediabetes 04/14/2019   Episodic tension-type headache, not intractable 09/30/2015   Hypertension 10/31/2014   Onychomycosis 12/11/2011   HIV disease ([place]) 12/28/2007   Osteoarthritis 12/28/2007    REFERRING DIAG: M17.0 (ICD-10-CM) - Osteoarthritis of both knees, unspecified osteoarthritis type   THERAPY DIAG:  Acute bilateral knee pain  Muscle weakness (generalized)  Rationale for Evaluation and Treatment Rehabilitation  ONSET DATE: 03/01/22   SUBJECTIVE:    SUBJECTIVE STATEMENT: Pt reports pain has continued without relief.   PAIN:  Are you having pain? Yes: NPRS scale: 8/10 with STS s use of hands. 4/10 walking, 0/10 Pain location: B knees  Pain description: sharp, throb, ache constant Aggravating factors: All activities Relieving factors: Voltaren and ibuprofen,  10/10 when it occurred    PERTINENT HISTORY: OA, HIV, Hx gout   PRECAUTIONS: HIV   WEIGHT BEARING RESTRICTIONS: No   FALLS:  Has patient fallen in last 6 months? 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 05/15/11-Mild narrowing of medial  joint compartment.  No joint effusion.    PATIENT SURVEYS:  LEFS 39, 48%   COGNITION: Overall cognitive status: Within functional limits for tasks assessed                         SENSATION: WFL   EDEMA:  None observed or palpated   MUSCLE LENGTH: 03/26/22: Hamstrings: Right 68 deg; Left TBA 72 03/26/22: Thomas test: Right deg; Left 10 deg 03/26/22: Prone quad: Right 108 deg: Left 98 deg    POSTURE: No Significant postural limitations   PALPATION: TTP medial and lateral patella's bilat and  taut muscle bands vastus lateralis bilat   LOWER EXTREMITY ROM:   Active ROM Right eval Left eval  Hip flexion      Hip extension      Hip abduction      Hip adduction      Hip internal rotation      Hip external rotation      Knee flexion      Knee extension      Ankle dorsiflexion      Ankle plantarflexion      Ankle inversion      Ankle eversion       (Blank rows = not tested)   LOWER EXTREMITY MMT:   MMT Right eval Left eval  Hip flexion      Hip extension      Hip abduction      Hip adduction      Hip internal rotation      Hip external rotation      Knee flexion 0 0  Knee extension 125 125  Ankle dorsiflexion      Ankle plantarflexion      Ankle inversion      Ankle eversion       (Blank rows = not tested)   LOWER EXTREMITY SPECIAL TESTS:  Knee special tests: McMurray's test: negative, Patellafemoral apprehension test: negative, Patellafemoral grind test: negative, and Patella tap test (ballotable patella): negative   FUNCTIONAL TESTS:  5 times sit to stand: 20.6"  04/09/22: 19.94    GAIT: Distance walked: 200' Assistive device utilized: None Level of assistance: Complete Independence Comments: WNLs' for distanced walked. Pt reports an antalgic gait pattern with prolonged walking or activity   TODAY'S TREATMENT: [facility] Adult PT Treatment:                                                DATE: 04/09/22 Therapeutic Exercise: Rec Bike L2 x 5 minutes  STS elevated seat with airex- multiple reps with and without tape donned Supine QS, self palpating VMO  SLR with ER 2# x 15 each Seated LAQ, self palpating VMO Manual Therapy: Mcconnell tape bilateral knees, lateral to medial pull Will need to request more treatment visits next session   [facility] Adult PT Treatment:                                                DATE: 04/02/22 Therapeutic Exercise: Rec bike L4 x 5 minutes SLR c QS 2x10 each Side hip abduction 2x10 each  Supine clams BluTB 2x10 LAQ c ball  squeeze 2x10 3# STS mat table c  2 airex, x5 s hands Prone quad stretch with strap 2 x 30 sec each  Hip flexor stretch standing 2x 30 sec each  [facility] Adult PT Treatment:                                                DATE: 03/28/22 Therapeutic Exercise: Nustep 5 mins L5 LE Seated hamstring stretch x2 30" each Prone quad stretch x2 30" Knee ext omega 2x10 15# Leg press 2x10 60# Manual Therapy: STM/DTM to the L and R quads Skilled palpation to identify TrPs and taut muscle bands  Trigger Point Dry Needling Treatment: Pre-treatment instruction: Patient instructed on dry needling rationale, procedures, and possible side effects including pain during treatment (achy,cramping feeling), bruising, drop of blood, lightheadedness, nausea, sweating. Patient Consent Given: Yes Education handout provided: Yes Muscles treated: L and R vastus lateralis  Needle size and number: .30x37m x 2 Electrical stimulation performed: No Parameters: NA Treatment response/outcome: Twitch response elicited Post-treatment instructions: Patient instructed to expect possible mild to moderate muscle soreness later today and/or tomorrow. Patient instructed in methods to reduce muscle soreness and to continue prescribed HEP. If patient was dry needled over the lung field, patient was instructed on signs and symptoms of pneumothorax and, however unlikely, to see immediate medical attention should they occur. Patient was also educated on signs and symptoms of infection and to seek medical attention should they occur. Patient verbalized understanding of these instructions and education.     [facility]                                                [facility]: 03/[facility]/22 Therapeutic Exercise: Rec bike L2 x 5 minutes Prone quad stretch with strap 2 x 30 sec each  Qs into towel x 10 each  SLR x 10 each, then with 2# x 10 each  Side hip abduction x 15 each  Hip flexor stretch EOM with strap, left Manual Therapy: Medial  patella creep , bilat   PATIENT EDUCATION:  Education details: Eval findings, POC, HEP, self care  Person educated: Patient Education method: Explanation, Demonstration, Tactile cues, Verbal cues, and Handouts Education comprehension: verbalized understanding, returned demonstration, verbal cues required, and tactile cues required   HOME EXERCISE PROGRAM: Access Code: [email or web address] URL: [email or web address] Date: 03/21/2022 Prepared by: AGar Ponto - Active Straight Leg Raise with Quad Set  - 1 x daily - 7 x weekly - 2 sets - 10 reps - 3 hold - Sidelying Hip Abduction  - 1 x daily - 7 x weekly - 2 sets - 10 reps - 3 hold - Hooklying Clamshell with Resistance  - 1 x daily - 7 x weekly - 2 sets - 10 reps - 3 hold - Seated Long Arc Quad  - 1 x daily - 7 x weekly - 2 sets - 10 reps - 3 hold - Sit to Stand Without Arm Support  - 1 x daily - 7 x weekly - 1 sets - 10 reps - 3 hold Added 03/26/22 - Supine Quadriceps Stretch with Strap on Table  - 1 x daily - 7 x weekly - 1 sets - 3 reps - 30 hold   ASSESSMENT:   CLINICAL IMPRESSION:  Pt is experiencing unrelenting bilat knee pain over the last week. Min improvement with activity modification, muscle rub and OTC meds. Pt was found to lave lateral tracking patella, R >L. McConnel tape applied to bilateral knees with min improvement on right and moderate improvement on the left. His 5 x STS is minimally improved and painful to perform. Pt has athletic tape and will bring to next session. Will need to request more visits.  Pt tolerated PT today without adverse effects. Pt will continue to benefit from skilled PT to address impairments for improved LE/Knee function with less pain.     OBJECTIVE IMPAIRMENTS: decreased activity tolerance, difficulty walking, decreased strength, increased muscle spasms, and pain.    ACTIVITY LIMITATIONS: carrying, lifting, bending, sitting, standing, squatting, stairs, and locomotion level   PARTICIPATION  LIMITATIONS: cleaning, laundry, and occupation   PERSONAL FACTORS: Past/current experiences, Profession, and Time since onset of injury/illness/exacerbation are also affecting patient's functional outcome.    REHAB POTENTIAL: Good   CLINICAL DECISION MAKING: Stable/uncomplicated   EVALUATION COMPLEXITY: Low     GOALS:   SHORT TERM GOALS: Target date: 03/29/22 Pt will be Ind in an initial HEP  Baseline: initiated Status: pt had no questions about his current program Goal status: MET 04/02/22   2.  Pt will voice understanding of measures to assist in pain reduction  Baseline: initiated Status: 04/02/22- using cold packs and completing his HEP Goal status: MET   LONG TERM GOALS: Target date: 05/03/22   Pt will be Ind in a final HEP to maintain achieved LOF  Baseline: initiated Goal status: INITIAL   2.  Increase bilat hip strength to 5/5 for improved lower body  Baseline: see flow sheets Goal status: INITIAL   3.  Improve 5xSTS by MCID of 5" as indication of improved functional mobility  Baseline: 20.6 s use of hands Goal status: INITIAL   4.  Pt's LEFS score will improved to 70% or greater as indication of improved function  Baseline: 48% Goal status: INITIAL   5.  Pt will report a decrease in bilat knee pain to 3/10 of less intermittently for improve function of the Legs with ADLs and his work with home improvement Baseline: 7/10 Goal status: INITIAL    PLAN:   PT FREQUENCY: 2x/week   PT DURATION: 6 weeks   PLANNED INTERVENTIONS: Therapeutic exercises, Therapeutic activity, Balance training, Gait training, Patient/Family education, Self Care, Joint mobilization, Stair training, Aquatic Therapy, Dry Needling, Electrical stimulation, Cryotherapy, Moist heat, Taping, Vasopneumatic device, Ultrasound, Ionotophoresis '4mg'$ /ml Dexamethasone, Manual therapy, and Re-evaluation   PLAN FOR NEXT SESSION: request more visits ; Review LEFS; assess response to HEP; progress therex  as indicated; use of modalities, manual therapy; and TPDN as indicated.  Hessie Diener, PTA 04/09/22 10:11 AM Phone: [phone] Fax: [phone]

## 2022-04-10 NOTE — Therapy (Signed)
OUTPATIENT PHYSICAL THERAPY TREATMENT NOTE/Re-Auth   Patient Name: Chad Little MRN: [id number] DOB:08/23/1960, 62 y.o., male Today's Date: 04/11/2022  PCP: Serita Butcher, MD  REFERRING PROVIDER: Lucious Groves, DO   END OF SESSION:   PT End of Session - 04/11/22 0832     Visit Number 7    Number of Visits 15    Date for PT Re-Evaluation 05/03/22    Authorization Type [place] MEDICAID [facility] 6 visits    Authorization - Visit Number 6    Authorization - Number of Visits 6    PT Start Time 0805    PT Stop Time 0845    PT Time Calculation (min) 40 min    Activity Tolerance Patient tolerated treatment well    Behavior During Therapy [facility] for tasks assessed/performed               Past Medical History:  Diagnosis Date   Arthritis    Enlarged prostate    Essential hypertension    GERD (gastroesophageal reflux disease)    Gout    diet controlled no recent flare ups   HIV infection ([place])    Superficial vein thrombosis 08/08/2020   Past Surgical History:  Procedure Laterality Date   APPENDECTOMY  2005   open   Right elbow surgery  dec3 2020   in [place]   TRANSURETHRAL RESECTION OF PROSTATE N/A 02/14/2020   Procedure: TRANSURETHRAL RESECTION OF THE PROSTATE (TURP), BIPOLAR;  Surgeon: Janith Lima, MD;  Location: [facility];  Service: Urology;  Laterality: N/A;   Patient Active Problem List   Diagnosis Date Noted   Fatigue 03/05/2022   Seborrheic dermatitis 12/21/2021   Medial epicondylitis of left elbow 12/21/2021   Blurry vision, left eye 09/20/2021   Erectile dysfunction 08/15/2021   Lightheadedness 08/10/2021   Varicose veins of left lower extremity 08/10/2021   Arthritis of carpometacarpal [facility]) joint of left thumb 07/13/2021   Nail dystrophy 06/08/2021   Thoracic back pain 05/02/2021   Thumb pain, left 01/15/2021   Sprain of tibiofibular ligament of left ankle 01/15/2021   BPPV (benign paroxysmal positional vertigo), bilateral  10/23/2020   Seasonal allergies 07/06/2020   GERD (gastroesophageal reflux disease) 06/13/2020   S/P TURP 03/22/2020   Healthcare maintenance 05/25/2019   Prediabetes 04/14/2019   Episodic tension-type headache, not intractable 09/30/2015   Hypertension 10/31/2014   Onychomycosis 12/11/2011   HIV disease ([place]) 12/28/2007   Osteoarthritis 12/28/2007    REFERRING DIAG: M17.0 (ICD-10-CM) - Osteoarthritis of both knees, unspecified osteoarthritis type   THERAPY DIAG:  Acute bilateral knee pain  Muscle weakness (generalized)  Other low back pain  Rationale for Evaluation and Treatment Rehabilitation  ONSET DATE: 03/01/22   SUBJECTIVE:    SUBJECTIVE STATEMENT: Pt reports good bilat knee pain relief with the medial patella taping.  PAIN:  Are you having pain? Yes: NPRS scale: 4/10 with STS s use of hands. 3/10 walking, 0/10 sitting Pain location: B knees  Pain description: sharp, throb, ache constant Aggravating factors: All activities Relieving factors: Voltaren and ibuprofen,  10/10 when it occurred    PERTINENT HISTORY: OA, HIV, Hx gout   PRECAUTIONS: HIV   WEIGHT BEARING RESTRICTIONS: No   FALLS:  Has patient fallen in last 6 months? 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 05/15/11-Mild narrowing of medial joint compartment.  No joint effusion.    PATIENT SURVEYS:  LEFS 39, 48%   COGNITION: Overall cognitive status: Within functional limits for tasks assessed                         SENSATION: WFL   EDEMA:  None observed or palpated   MUSCLE LENGTH: 03/26/22: Hamstrings: Right 68 deg; Left TBA 72 03/26/22: Thomas test:  Right deg; Left 10 deg 03/26/22: Prone quad: Right 108 deg: Left 98 deg    POSTURE: No Significant postural limitations   PALPATION: TTP medial and lateral patella's bilat and taut muscle bands vastus lateralis bilat   LOWER EXTREMITY ROM:   Active ROM Right eval Left eval  Hip flexion      Hip extension      Hip abduction      Hip adduction      Hip internal rotation      Hip external rotation      Knee flexion      Knee extension      Ankle dorsiflexion      Ankle plantarflexion      Ankle inversion      Ankle eversion       (Blank rows = not tested)   LOWER EXTREMITY MMT:   MMT Right eval Left eval  Hip flexion      Hip extension      Hip abduction      Hip adduction      Hip internal rotation      Hip external rotation      Knee flexion 0 0  Knee extension 125 125  Ankle dorsiflexion      Ankle plantarflexion      Ankle inversion      Ankle eversion       (Blank rows = not tested)   LOWER EXTREMITY SPECIAL TESTS:  Knee special tests: McMurray's test: negative, Patellafemoral apprehension test: negative, Patellafemoral grind test: negative, and Patella tap test (ballotable patella): negative   FUNCTIONAL TESTS:  5 times sit to stand: 20.6"  04/09/22: 19.94    GAIT: Distance walked: 200' Assistive device utilized: None Level of assistance: Complete Independence Comments: WNLs' for distanced walked. Pt reports an antalgic gait pattern with prolonged walking or activity   TODAY'S TREATMENT: [facility] Adult PT Treatment:                                                DATE: 04/11/22 Therapeutic Exercise: SLR with ER 2#  3x15 each LAQ c ball squeeze 3x15 each STS from elevated mat table, Knees at 90d, 2x10 Manual Therapy: Lateral to medial patella, leukotape taping Rozann Lesches) Self Care: Instruction in how to complete c athletic tape at home  [facility] Adult PT Treatment:                                                DATE: 04/09/22 Therapeutic Exercise: Rec Bike  L2 x 5 minutes  STS elevated seat with airex- multiple reps with and  without tape donned Supine QS, self palpating VMO  SLR with ER 2# x 15 each Seated LAQ, self palpating VMO Manual Therapy: Mcconnell tape bilateral knees, lateral to medial pull Will need to request more treatment visits next session   [facility] Adult PT Treatment:                                                DATE: 04/02/22 Therapeutic Exercise: Rec bike L4 x 5 minutes SLR c QS 2x10 each Side hip abduction 2x10 each  Supine clams BluTB 2x10 LAQ c ball squeeze 2x10 3# STS mat table c 2 airex, x5 s hands Prone quad stretch with strap 2 x 30 sec each  Hip flexor stretch standing 2x 30 sec each  [facility] Adult PT Treatment:                                                DATE: 03/28/22 Therapeutic Exercise: Nustep 5 mins L5 LE Seated hamstring stretch x2 30" each Prone quad stretch x2 30" Knee ext omega 2x10 15# Leg press 2x10 60# Manual Therapy: STM/DTM to the L and R quads Skilled palpation to identify TrPs and taut muscle bands  Trigger Point Dry Needling Treatment: Pre-treatment instruction: Patient instructed on dry needling rationale, procedures, and possible side effects including pain during treatment (achy,cramping feeling), bruising, drop of blood, lightheadedness, nausea, sweating. Patient Consent Given: Yes Education handout provided: Yes Muscles treated: L and R vastus lateralis  Needle size and number: .30x10m x 2 Electrical stimulation performed: No Parameters: NA Treatment response/outcome: Twitch response elicited Post-treatment instructions: Patient instructed to expect possible mild to moderate muscle soreness later today and/or tomorrow. Patient instructed in methods to reduce muscle soreness and to continue prescribed HEP. If patient was dry needled over the lung field, patient was instructed on signs and symptoms of pneumothorax and, however unlikely, to see immediate medical attention should they  occur. Patient was also educated on signs and symptoms of infection and to seek medical attention should they occur. Patient verbalized understanding of these instructions and education.     PATIENT EDUCATION:  Education details: Eval findings, POC, HEP, self care  Person educated: Patient Education method: Explanation, Demonstration, Tactile cues, Verbal cues, and Handouts Education comprehension: verbalized understanding, returned demonstration, verbal cues required, and tactile cues required   HOME EXERCISE PROGRAM: Access Code: [email or web address] URL: [email or web address] Date: 03/21/2022 Prepared by: AGar Ponto - Active Straight Leg Raise with Quad Set  - 1 x daily - 7 x weekly - 2 sets - 10 reps - 3 hold - Sidelying Hip Abduction  - 1 x daily - 7 x weekly - 2 sets - 10 reps - 3 hold - Hooklying Clamshell with Resistance  - 1 x daily - 7 x weekly - 2 sets - 10 reps - 3 hold - Seated Long Arc Quad  - 1 x daily - 7 x weekly - 2 sets - 10 reps - 3 hold - Sit to Stand Without Arm Support  - 1 x daily - 7 x weekly - 1 sets - 10 reps - 3 hold Added 03/26/22 - Supine Quadriceps Stretch with Strap on Table  - 1 x daily - 7 x weekly -  1 sets - 3 reps - 30 hold   ASSESSMENT:   CLINICAL IMPRESSION: Pt reports good improvement with his bilat knee pain at rest, walking and sit to stand with the McConnell taping which was initiated the last PT session. PT was completed for McConnell taping of both kness to correct lateral patella tracking. Pt was instructed in how to complete the taping at home. Pt then participated in [place] with  emphasis on the VMOs. 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 Pt is making appropriate progress with his condition. Continued strengthening is needed to improve the lateral tracking issue the pt is  experincing with both knees. Pt will continue to benefit from skilled PT 1w8 to address impairments of both knees for improved function with less pain.   OBJECTIVE IMPAIRMENTS: decreased activity tolerance, difficulty walking, decreased strength, increased muscle spasms, and pain.    ACTIVITY LIMITATIONS: carrying, lifting, bending, sitting, standing, squatting, stairs, and locomotion level   PARTICIPATION LIMITATIONS: cleaning, laundry, and occupation   PERSONAL FACTORS: Past/current experiences, Profession, and Time since onset of injury/illness/exacerbation are also affecting patient's functional outcome.    REHAB POTENTIAL: Good   CLINICAL DECISION MAKING: Stable/uncomplicated   EVALUATION COMPLEXITY: Low     GOALS:   SHORT TERM GOALS: Target date: 03/29/22 Pt will be Ind in an initial HEP  Baseline: initiated Status: pt had no questions about his current program Goal status: MET 04/02/22   2.  Pt will voice understanding of measures to assist in pain reduction  Baseline: initiated Status: 04/02/22- using cold packs and completing his HEP Goal status: MET   LONG TERM GOALS: Target date: 06/14/22   Pt will be Ind in a final HEP to maintain achieved LOF  Baseline: initiated Goal status: Ongoing   2.  Increase bilat hip strength to 5/5 for improved lower body  Baseline: see flow sheets Goal status: Ongoing   3.  Improve 5xSTS by MCID of 5" as indication of improved functional mobility  Baseline: 20.6 s use of hands Status: 04/09/22= 19.9 Goal status: Improving   4.  Pt's LEFS score will improved to 70% or greater as indication of improved function  Baseline: 48% Goal status: Ongoing   5.  Pt will report a decrease in bilat knee pain to 3/10 of less intermittently for improve function of the Legs with ADLs and his work with home improvement Baseline: 7/10 Status: 0-4/10 Goal status: Improving with the initiation of McConnel taping.    PLAN:   PT FREQUENCY:  1x/week   PT DURATION: 8 weeks   PLANNED INTERVENTIONS: Therapeutic exercises, Therapeutic activity, Balance training, Gait training, Patient/Family education, Self Care, Joint mobilization, Stair training, Aquatic Therapy, Dry Needling, Electrical stimulation, Cryotherapy, Moist heat, Taping, Vasopneumatic device, Ultrasound, Ionotophoresis '4mg'$ /ml Dexamethasone, Manual therapy, and Re-evaluation   PLAN FOR NEXT SESSION: request more visits ; Review LEFS; assess response to HEP; progress therex as indicated; use of modalities, manual therapy; and TPDN as indicated.  Raden Byington MS, PT 04/11/22 6:01 PM   [employer] Authorization   Choose one: Rehabilitative  Standardized Assessment or Functional Outcome Tool: See Pain Assessment, LEFS, and 5x sit to stand  Score or Percent Disability:LEFS 48% functional ability  Body Parts Treated (Select each separately):  L Knee. Overall deficits/functional limitations for body part selected: moderate R Knee. Overall deficits/functional limitations for body part selected: moderate  If treatment provided at initial evaluation, no treatment charged due to lack of authorization.

## 2022-04-11 ENCOUNTER — Ambulatory Visit: Payer: Medicaid Other | Admitting: Student

## 2022-04-11 ENCOUNTER — Ambulatory Visit: Payer: Medicaid Other

## 2022-04-11 ENCOUNTER — Encounter: Payer: Self-pay | Admitting: Student

## 2022-04-11 ENCOUNTER — Other Ambulatory Visit: Payer: Self-pay

## 2022-04-11 VITALS — BP 131/84 | HR 72 | Temp 97.7°F | Ht 77.0 in | Wt 258.2 lb

## 2022-04-11 DIAGNOSIS — M5459 Other low back pain: Secondary | ICD-10-CM | POA: Diagnosis not present

## 2022-04-11 DIAGNOSIS — M25561 Pain in right knee: Secondary | ICD-10-CM

## 2022-04-11 DIAGNOSIS — L219 Seborrheic dermatitis, unspecified: Secondary | ICD-10-CM

## 2022-04-11 DIAGNOSIS — M25562 Pain in left knee: Secondary | ICD-10-CM | POA: Diagnosis not present

## 2022-04-11 DIAGNOSIS — J302 Other seasonal allergic rhinitis: Secondary | ICD-10-CM

## 2022-04-11 DIAGNOSIS — M6281 Muscle weakness (generalized): Secondary | ICD-10-CM

## 2022-04-11 MED ORDER — KETOCONAZOLE 2 % EX CREA: 1.0000 | TOPICAL_CREAM | Freq: Two times a day (BID) | CUTANEOUS | 0 refills | Status: DC

## 2022-04-11 NOTE — Patient Instructions (Signed)
Thank you so much for coming to the clinic today!   For your allergies, I think the best thing you can do is keep doing the flonase, and you can start taking claritin for it as well. For the rash on the face, I am sending in a cream for you to use. It is important that you use it twice a day for a month. When you do use it, make sure to thoroughly cover your face and the affected areas with it. If your rash does not heal up in one month, please come back to the clinic for further evaluation. Thank you!   If you have any questions please feel free to the call the clinic at anytime at [phone]. It was a pleasure seeing you!  Best, Dr. Sanjuana Mae

## 2022-04-12 ENCOUNTER — Ambulatory Visit: Payer: Medicaid Other | Admitting: Podiatry

## 2022-04-13 NOTE — Assessment & Plan Note (Signed)
Pt states he has history of seasonal allergies, and since the weather has been changing has been much worse. He also endorses using a heater at night, which could be contributing to exacerbation of symptoms due to increased dryness. His symptoms include mild nasal congestion and watering in his eyes. He denies any systemic symptoms such as fever, chills, or diarrhea. He has flonase at home which helps relieve his symptoms, recommended to obtain over the counter claritin for further symptomatic relief.   Plan:  - OTC Claritin

## 2022-04-13 NOTE — Assessment & Plan Note (Signed)
Pt presents rash on face that has persisted since August of 2023. He was recently seen in the clinic for evaluation of scalp region as well as satellite spots in his face. There are numerous hypopigmented lesions spread throughout his face as well as scalp. They are not pruritic or painful. He does not have any allergies or new exposures since his last visit. Please see physical exam above for pictures of rash.   At last visit, he was given ketoconazole 2% shampoo to use 2 times a week and he said it did improve for about a week, but spots subsequently returned. Given his history of HIV, he is more at risk for Seborrheic dermatitis than general population.   Plan:  - Will give ketoconazole cream for patient to use thoroughly on affected areas of face

## 2022-04-13 NOTE — Progress Notes (Signed)
CC: Rash on face  HPI:  [name] is a 62 y.o. male living with a history stated below and presents today for Rash on face. Please see problem based assessment and plan for additional details.  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 30 tablet 11   dolutegravir (TIVICAY) 50 MG tablet Take 1 tablet (50 mg total) by mouth daily. 30 tablet 5   emtricitabine-tenofovir AF (DESCOVY) 200-25 MG tablet Take 1 tablet by mouth daily. 30 tablet 5   fluticasone (FLONASE) 50 MCG/ACT nasal spray SHAKE LIQUID AND USE 1 SPRAY IN EACH NOSTRIL DAILY 16 g 2   methocarbamol (ROBAXIN) 500 MG tablet Take 1 tablet (500 mg total) by mouth 2 (two) times daily. 30 tablet 0   Olmesartan-amLODIPine-HCTZ (TRIBENZOR) 40-5-25 MG TABS Take 1 tablet by mouth daily. 30 tablet 2   pantoprazole (PROTONIX) 40 MG tablet Take 1 tablet (40 mg total) by mouth 2 (two) times daily. 180 tablet 0   sildenafil (VIAGRA) 50 MG tablet Take 1 tablet (50 mg total) by mouth as needed for erectile dysfunction. 20 tablet 1   traMADol (ULTRAM) 50 MG tablet Take 1 tablet (50 mg total) by mouth every 6 (six) hours as needed. 15 tablet 0   No current facility-administered medications on file prior to visit.    Family History  Problem Relation Age of Onset   Alcohol abuse Mother    Hypertension Father    Stroke Father     Social History   Socioeconomic History   Marital status: Single    Spouse name: Not on file   Number of children: Not on file   Years of education: Not on file   Highest education level: Not on file  Occupational  History   Not on file  Tobacco Use   Smoking status: Never   Smokeless tobacco: Never  Vaping Use   Vaping Use: Never used  Substance and Sexual Activity   Alcohol use: Not Currently    Alcohol/week: 0.0 standard drinks of alcohol    Comment: none since 2017   Drug use: Not Currently    Types: Marijuana, "Crack" cocaine    Comment:  cocaine and marijuana none since 2017   Sexual activity: Not Currently    Comment: refused  condoms 11/09/2019  Other Topics Concern   Not on file  Social History Narrative   Not on file   Social Determinants of Health   Financial Resource Strain: Not on file  Food Insecurity: No Food Insecurity (03/05/2022)   Hunger Vital Sign    Worried About Running Out of Food in the Last Year: Never true    Ran Out of Food in the Last Year: Never true  Transportation Needs: No Transportation Needs (03/05/2022)   PRAPARE - Hydrologist (Medical): No    Lack of Transportation (Non-Medical): No  Physical Activity: Not on file  Stress: Not on file  Social Connections: Moderately Isolated (03/05/2022)   Social Connection and Isolation Panel [NHANES]    Frequency of  Communication with Friends and Family: More than three times a week    Frequency of Social Gatherings with Friends and Family: More than three times a week    Attends Religious Services: More than 4 times per year    Active Member of [employer] or Organizations: No    Attends Archivist Meetings: Never    Marital Status: Never married  Intimate Partner Violence: Not At Risk (03/05/2022)   Humiliation, Afraid, Rape, and Kick questionnaire    Fear of Current or Ex-Partner: No    Emotionally Abused: No    Physically Abused: No    Sexually Abused: No    Review of Systems: ROS negative except for what is noted on the assessment and plan.  Vitals:   04/11/22 1419 04/11/22 1505  BP: (!) 126/91 131/84  Pulse: 79 72  Temp: 97.7 F (36.5 C)   TempSrc: Oral   SpO2:  96%   Weight: 258 lb 3.2 oz (117.1 kg)   Height: '6\' 5"'$  (1.956 m)     Physical Exam: Constitutional: well-appearing male  in no acute distress HENT: normocephalic atraumatic, mucous membranes moist Eyes: conjunctiva non-erythematous Neck: supple Cardiovascular: regular rate and rhythm, no m/r/g Pulmonary/Chest: normal work of breathing on room air, lungs clear to auscultation bilaterally Skin: warm and dry, multiple hypopigmented lesions spread across face        Assessment & Plan:   Seborrheic dermatitis Pt presents rash on face that has persisted since August of 2023. He was recently seen in the clinic for evaluation of scalp region as well as satellite spots in his face. There are numerous hypopigmented lesions spread throughout his face as well as scalp. They are not pruritic or painful. He does not have any allergies or new exposures since his last visit. Please see physical exam above for pictures of rash.   At last visit, he was given ketoconazole 2% shampoo to use 2 times a week and he said it did improve for about a week, but spots subsequently returned. Given his history of HIV, he is more at risk for Seborrheic dermatitis than general population.   Plan:  - Will give ketoconazole cream for patient to use thoroughly on affected areas of face  Seasonal allergies Pt states he has history of seasonal allergies, and since the weather has been changing has been much worse. He also endorses using a heater at night, which could be contributing to exacerbation of symptoms due to increased dryness. His symptoms include mild nasal congestion and watering in his eyes. He denies any systemic symptoms such as fever, chills, or diarrhea. He has flonase at home which helps relieve his symptoms, recommended to obtain over the counter claritin for further symptomatic relief.   Plan:  - OTC Claritin  Patient discussed with Dr. Charissa Bash Chad Little, M.D. [facility],  PGY-1 Pager: [phone] Date 04/13/2022 Time 8:43 PM

## 2022-04-16 ENCOUNTER — Ambulatory Visit: Payer: Medicaid Other | Admitting: Physical Therapy

## 2022-04-16 NOTE — Progress Notes (Signed)
Internal Medicine Clinic Attending  Case discussed with Dr. Nooruddin  At the time of the visit.  We reviewed the resident's history and exam and pertinent patient test results.  I agree with the assessment, diagnosis, and plan of care documented in the resident's note.  

## 2022-04-18 ENCOUNTER — Ambulatory Visit: Payer: Medicaid Other

## 2022-04-23 ENCOUNTER — Ambulatory Visit: Payer: Medicaid Other | Admitting: Physical Therapy

## 2022-04-24 ENCOUNTER — Ambulatory Visit: Payer: Medicaid Other | Admitting: Podiatry

## 2022-04-24 DIAGNOSIS — L600 Ingrowing nail: Secondary | ICD-10-CM

## 2022-04-24 NOTE — Progress Notes (Signed)
Subjective:  Patient ID: Chad Little, male    DOB: 1960/12/26,  MRN: [id number]  Chief Complaint  Patient presents with   Nail Problem    Removal of nails     62 y.o. male presents with the above complaint.  Patient presents with bilateral hallux thickened and negative dystrophic mycotic nails he would like to have it removed and made permanent he has not seen anyone as prior to seeing me for this.  He denies any other acute complaints.  He is not a diabetic.  Pain scale 7 out of 10 dull achy in nature hurts with ambulation worse with pressure hurts with some type issues   Review of Systems: Negative except as noted in the HPI. Denies N/V/F/Ch.  Past Medical History:  Diagnosis Date   Arthritis    Enlarged prostate    Essential hypertension    GERD (gastroesophageal reflux disease)    Gout    diet controlled no recent flare ups   HIV infection (HCC)    Superficial vein thrombosis 08/08/2020    Current Outpatient Medications:    atorvastatin (LIPITOR) 10 MG tablet, Take 1 tablet (10 mg total) by mouth daily., Disp: 30 tablet, Rfl: 11   dolutegravir (TIVICAY) 50 MG tablet, Take 1 tablet (50 mg total) by mouth daily., Disp: 30 tablet, Rfl: 5   emtricitabine-tenofovir AF (DESCOVY) 200-25 MG tablet, Take 1 tablet by mouth daily., Disp: 30 tablet, Rfl: 5   fluticasone (FLONASE) 50 MCG/ACT nasal spray, SHAKE LIQUID AND USE 1 SPRAY IN EACH NOSTRIL DAILY, Disp: 16 g, Rfl: 2   ketoconazole (NIZORAL) 2 % cream, Apply 1 Application topically 2 (two) times daily., Disp: 60 g, Rfl: 0   methocarbamol (ROBAXIN) 500 MG tablet, Take 1 tablet (500 mg total) by mouth 2 (two) times daily., Disp: 30 tablet, Rfl: 0   Olmesartan-amLODIPine-HCTZ (TRIBENZOR) 40-5-25 MG TABS, Take 1 tablet by mouth daily., Disp: 30 tablet, Rfl: 2   pantoprazole (PROTONIX) 40 MG tablet, Take 1 tablet (40 mg total) by mouth 2 (two) times daily., Disp: 180 tablet, Rfl: 0   sildenafil (VIAGRA) 50 MG tablet, Take 1  tablet (50 mg total) by mouth as needed for erectile dysfunction., Disp: 20 tablet, Rfl: 1   traMADol (ULTRAM) 50 MG tablet, Take 1 tablet (50 mg total) by mouth every 6 (six) hours as needed., Disp: 15 tablet, Rfl: 0  Social History   Tobacco Use  Smoking Status Never  Smokeless Tobacco Never    No Known Allergies Objective:  There were no vitals filed for this visit. There is no height or weight on file to calculate BMI. Constitutional Well developed. Well nourished.  Vascular Dorsalis pedis pulses palpable bilaterally. Posterior tibial pulses palpable bilaterally. Capillary refill normal to all digits.  No cyanosis or clubbing noted. Pedal hair growth normal.  Neurologic Normal speech. Oriented to person, place, and time. Epicritic sensation to light touch grossly present bilaterally.  Dermatologic Pain on palpation of the entire/total nail on 1st digit of the bilaterally No other open wounds. No skin lesions.  Orthopedic: Normal joint ROM without pain or crepitus bilaterally. No visible deformities. No bony tenderness.   Radiographs: None Assessment:   1. Ingrown toenail of right foot   2. Ingrown left big toenail    Plan:  Patient was evaluated and treated and all questions answered.  Nail contusion/dystrophy hallux, bilaterally -Patient elects to proceed with minor surgery to remove entire toenail today. Consent reviewed and signed by patient. -Entire/total nail excised.  See procedure note. -Educated on post-procedure care including soaking. Written instructions provided and reviewed. -Patient to follow up in 2 weeks for nail check.  Procedure: Excision of entire/total nail with phenol matricectomy Location: Bilateral 1st toe digit Anesthesia: Lidocaine 1% plain; 1.5 mL and Marcaine 0.5% plain; 1.5 mL, digital block. Skin Prep: Betadine. Dressing: Silvadene; telfa; dry, sterile, compression dressing. Technique: Following skin prep, the toe was exsanguinated  and a tourniquet was secured at the base of the toe. The affected nail border was freed and excised.  Phenol to second was performed in standard technique the tourniquet was then removed and sterile dressing applied. Disposition: Patient tolerated procedure well. Patient to return in 2 weeks for follow-up.   No follow-ups on file.

## 2022-04-25 ENCOUNTER — Ambulatory Visit: Payer: Medicaid Other | Admitting: Physical Therapy

## 2022-04-26 ENCOUNTER — Ambulatory Visit: Payer: Medicaid Other | Admitting: Podiatry

## 2022-04-26 ENCOUNTER — Other Ambulatory Visit (HOSPITAL_BASED_OUTPATIENT_CLINIC_OR_DEPARTMENT_OTHER): Payer: Self-pay

## 2022-04-26 DIAGNOSIS — M79675 Pain in left toe(s): Secondary | ICD-10-CM

## 2022-04-26 DIAGNOSIS — M79674 Pain in right toe(s): Secondary | ICD-10-CM

## 2022-04-26 DIAGNOSIS — B351 Tinea unguium: Secondary | ICD-10-CM

## 2022-04-26 NOTE — Progress Notes (Signed)
   Chief Complaint  Patient presents with   Nail Problem    Patient came in today for a nail check patient states that his hallux has sharp pain,     HPI: 62 y.o. male presenting today for follow-up evaluation of total permanent nail avulsions performed by Dr. Posey Pronto on 04/24/2022.  Patient had both of his bilateral great toenails removed with application of phenol following the procedure for permanent nail matricectomy.  He says that last night he had increased pain and tenderness and presents for follow-up treatment evaluation  Past Medical History:  Diagnosis Date   Arthritis    Enlarged prostate    Essential hypertension    GERD (gastroesophageal reflux disease)    Gout    diet controlled no recent flare ups   HIV infection ([place])    Superficial vein thrombosis 08/08/2020    Past Surgical History:  Procedure Laterality Date   APPENDECTOMY  2005   open   Right elbow surgery  dec3 2020   in [place]   TRANSURETHRAL RESECTION OF PROSTATE N/A 02/14/2020   Procedure: TRANSURETHRAL RESECTION OF THE PROSTATE (TURP), BIPOLAR;  Surgeon: Janith Lima, MD;  Location: [facility];  Service: Urology;  Laterality: N/A;    No Known Allergies   Physical Exam: General: The patient is alert and oriented x3 in no acute distress.  Dermatology: Healthy granular nailbed noted.  To the bilateral great toes.  No purulence.  No concern clinically for infection.  They appear to be healing nicely and routinely  Vascular: Palpable pedal pulses bilaterally. Capillary refill within normal limits.  Negative for any significant edema or erythema  Neurological: Grossly intact via light touch  Musculoskeletal Exam: No pedal deformities noted  Assessment: 1.  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  04/24/2022 with Dr. Posey Pronto   Plan of Care:  1. Patient evaluated.  Reassured the patient that the pain should slowly decrease over the following week or two 2.   Silvadene cream provided.  Apply daily 3.  Continue daily Epsom salt soaks 4.  Return to clinic 2 weeks for follow-up with Dr. Dell Ponto, DPM [facility]  Dr. Edrick Kins, DPM    [address], [place] [address]                Office [phone]  Fax [phone]

## 2022-04-30 ENCOUNTER — Encounter: Payer: Medicaid Other | Admitting: Physical Therapy

## 2022-05-02 ENCOUNTER — Encounter: Payer: Medicaid Other | Admitting: Internal Medicine

## 2022-05-02 ENCOUNTER — Ambulatory Visit: Payer: Medicaid Other | Admitting: Physical Therapy

## 2022-05-06 DIAGNOSIS — Z419 Encounter for procedure for purposes other than remedying health state, unspecified: Secondary | ICD-10-CM | POA: Diagnosis not present

## 2022-05-07 ENCOUNTER — Encounter: Payer: Medicaid Other | Admitting: Physical Therapy

## 2022-05-07 ENCOUNTER — Other Ambulatory Visit: Payer: Self-pay

## 2022-05-07 ENCOUNTER — Other Ambulatory Visit: Payer: Medicaid Other

## 2022-05-07 DIAGNOSIS — B20 Human immunodeficiency virus [HIV] disease: Secondary | ICD-10-CM | POA: Diagnosis not present

## 2022-05-07 DIAGNOSIS — Z79899 Other long term (current) drug therapy: Secondary | ICD-10-CM

## 2022-05-08 LAB — T-HELPER CELL (CD4) - (RCID CLINIC ONLY)
CD4 % Helper T Cell: 36 % (ref 33–65)
CD4 T Cell Abs: 702 /uL (ref 400–1790)

## 2022-05-09 LAB — COMPREHENSIVE METABOLIC PANEL
AG Ratio: 1.4 (calc) (ref 1.0–2.5)
ALT: 19 U/L (ref 9–46)
AST: 20 U/L (ref 10–35)
Albumin: 4.4 g/dL (ref 3.6–5.1)
Alkaline phosphatase (APISO): 102 U/L (ref 35–144)
BUN: 16 mg/dL (ref 7–25)
CO2: 26 mmol/L (ref 20–32)
Calcium: 9.7 mg/dL (ref 8.6–10.3)
Chloride: 105 mmol/L (ref 98–110)
Creat: 1.33 mg/dL (ref 0.70–1.35)
Globulin: 3.2 g/dL (calc) (ref 1.9–3.7)
Glucose, Bld: 108 mg/dL — ABNORMAL HIGH (ref 65–99)
Potassium: 4.7 mmol/L (ref 3.5–5.3)
Sodium: 142 mmol/L (ref 135–146)
Total Bilirubin: 0.6 mg/dL (ref 0.2–1.2)
Total Protein: 7.6 g/dL (ref 6.1–8.1)

## 2022-05-09 LAB — LIPID PANEL
Cholesterol: 111 mg/dL (ref <200)
HDL: 51 mg/dL (ref >=40)
LDL Cholesterol (Calc): 47 mg/dL (calc)
Non-HDL Cholesterol (Calc): 60 mg/dL (calc) (ref <130)
Total CHOL/HDL Ratio: 2.2 (calc) (ref <5.0)
Triglycerides: 45 mg/dL (ref <150)

## 2022-05-09 LAB — HIV-1 RNA QUANT-NO REFLEX-BLD
HIV 1 RNA Quant: NOT DETECTED Copies/mL
HIV-1 RNA Quant, Log: NOT DETECTED Log cps/mL

## 2022-05-13 ENCOUNTER — Ambulatory Visit: Payer: Medicaid Other | Admitting: Podiatry

## 2022-05-13 DIAGNOSIS — M79675 Pain in left toe(s): Secondary | ICD-10-CM | POA: Diagnosis not present

## 2022-05-13 DIAGNOSIS — M79674 Pain in right toe(s): Secondary | ICD-10-CM | POA: Diagnosis not present

## 2022-05-13 DIAGNOSIS — B351 Tinea unguium: Secondary | ICD-10-CM

## 2022-05-13 NOTE — Progress Notes (Signed)
   Chief Complaint  Patient presents with   Follow-up    Patient states that his left toe has has some minor drainage with 0/10 pain. Patient states that his right toe seems to be healing fine with 0/10 pain.     HPI: 62 y.o. male presenting today for follow-up evaluation status post total permanent nail avulsion to the bilateral great toes performed on 04/26/2022.  Patient doing well.  He has been soaking his foot and applying the antibiotic ointment as instructed.  No new complaints at this time  Past Medical History:  Diagnosis Date   Arthritis    Enlarged prostate    Essential hypertension    GERD (gastroesophageal reflux disease)    Gout    diet controlled no recent flare ups   HIV infection (HCC)    Superficial vein thrombosis 08/08/2020    Past Surgical History:  Procedure Laterality Date   APPENDECTOMY  2005   open   Right elbow surgery  dec3 2020   in [place]   TRANSURETHRAL RESECTION OF PROSTATE N/A 02/14/2020   Procedure: TRANSURETHRAL RESECTION OF THE PROSTATE (TURP), BIPOLAR;  Surgeon: Jannifer Hick, MD;  Location: [facility];  Service: Urology;  Laterality: N/A;    No Known Allergies   Physical Exam: General: The patient is alert and oriented x3 in no acute distress.  Dermatology: Absence of the nail plates noted bilateral great toes with healthy granular wound bed.  Nail avulsion sites appear to be healing appropriately.  Clinically no indication or sign of infection.  Routine healing noted  Vascular: Palpable pedal pulses bilaterally. Capillary refill within normal limits.  Negative for any significant edema or erythema  Neurological: Light touch and protective threshold grossly intact  Musculoskeletal Exam: No pedal deformities noted   Assessment: 1.  Status post bilateral great toe permanent nail avulsions.  04/27/2022  -Patient evaluated -Continue antibiotic ointment and a Band-Aid until completely resolved -Return to clinic as  needed     Felecia Shelling, DPM [facility]  Dr. Felecia Shelling, DPM    [address] [place], [place] [address]                Office [phone]  Fax [phone]

## 2022-05-14 ENCOUNTER — Encounter: Payer: Self-pay | Admitting: Student

## 2022-05-14 ENCOUNTER — Ambulatory Visit: Payer: Medicaid Other | Admitting: Student

## 2022-05-14 ENCOUNTER — Other Ambulatory Visit: Payer: Self-pay

## 2022-05-14 VITALS — BP 129/83 | HR 84 | Temp 98.0°F | Resp 24 | Ht 77.0 in | Wt 259.1 lb

## 2022-05-14 DIAGNOSIS — J302 Other seasonal allergic rhinitis: Secondary | ICD-10-CM

## 2022-05-14 DIAGNOSIS — R5383 Other fatigue: Secondary | ICD-10-CM | POA: Diagnosis not present

## 2022-05-14 DIAGNOSIS — Z0000 Encounter for general adult medical examination without abnormal findings: Secondary | ICD-10-CM

## 2022-05-14 DIAGNOSIS — I10 Essential (primary) hypertension: Secondary | ICD-10-CM | POA: Diagnosis not present

## 2022-05-14 DIAGNOSIS — J301 Allergic rhinitis due to pollen: Secondary | ICD-10-CM | POA: Diagnosis not present

## 2022-05-14 MED ORDER — FLUTICASONE PROPIONATE 50 MCG/ACT NA SUSP: 1.0000 | Freq: Two times a day (BID) | NASAL | 2 refills | Status: DC

## 2022-05-14 MED ORDER — FLUTICASONE PROPIONATE 50 MCG/ACT NA SUSP: 1.0000 | Freq: Two times a day (BID) | NASAL | 0 refills | Status: DC

## 2022-05-14 NOTE — Patient Instructions (Addendum)
Chad Little,  It was a pleasure seeing you in the clinic today.   I have refilled your flonase and have changed it to take twice a day. Please let me know if you run into any difficulties with additional refills. We are working to get your screening colonoscopy scheduled. Please come back in 4 months for your next visit.  Please call our clinic at [phone] if you have any questions or concerns. The best time to call is Monday-Friday from 9am-4pm, but there is someone available 24/7 at the same number. If you need medication refills, please notify your pharmacy one week in advance and they will send [place] a request.   Thank you for letting us take part in your care. We look forward to seeing you next time!

## 2022-05-14 NOTE — Assessment & Plan Note (Signed)
Living with HTN, currently on tribenzor 40-5-25mg  daily. BP in clinic today is 129/83, well controlled. Continue current regimen.

## 2022-05-14 NOTE — Assessment & Plan Note (Signed)
Has a history of seasonal allergies and allergic rhinitis. He notes that weather changes are when his symptoms flare up more frequently. He has currently been using OTC allegra along with flonase nasal spray with good relief of symptoms. He does note that he has been needing to use flonase twice a day for better relief of his symptoms. He does note that his fatigue has greatly improved after better management of his allergic rhinitis, able to perform his ADLs and iADLs without difficulties now.   Plan: -continue OTC allegra -increased flonase to 1 spray in each nare BID

## 2022-05-14 NOTE — Assessment & Plan Note (Signed)
Difficulties with GI referral with unclear communication. [facility] staff (Chilon) is looking into this and will help to schedule. Greatly appreciate her assistance.

## 2022-05-14 NOTE — Assessment & Plan Note (Signed)
Testing at prior visit grossly unremarkable for workup of fatigue. He does note significant improvement in his symptoms with better management of his allergic rhinitis. His energy has improved and he is able to now perform his ADLs and iADLs without difficulty. Will continue management as outlined in "seasonal allergic rhinitis" tab. Should his symptoms recur and are more predominant during wintertime, consider evaluation for seasonal depression as etiology of fatigue.

## 2022-05-14 NOTE — Progress Notes (Signed)
   CC: f/u allergic rhinitis  HPI:  [name] is a 62 y.o. male with history listed below presenting to the [facility] for f/u of seasonal allergic rhinitis. Please see individualized problem based charting for full HPI.  Past Medical History:  Diagnosis Date   Arthritis    Enlarged prostate    Essential hypertension    GERD (gastroesophageal reflux disease)    Gout    diet controlled no recent flare ups   HIV infection    Superficial vein thrombosis 08/08/2020    Review of Systems:  Negative aside from that listed in individualized problem based charting.  Physical Exam:  Vitals:   05/14/22 0917  BP: 129/83  Pulse: 84  Resp: (!) 24  Temp: 98 F (36.7 C)  TempSrc: Oral  SpO2: 97%  Weight: 259 lb 1.6 oz (117.5 kg)  Height: 6\' 5"  (1.956 m)   Physical Exam Constitutional:      Appearance: Normal appearance. He is obese. He is not ill-appearing.  HENT:     Nose: Nose normal. No congestion or rhinorrhea.     Comments: No significant swelling or redness noted. No rhinorrhea.    Mouth/Throat:     Mouth: Mucous membranes are moist.     Pharynx: Oropharynx is clear. No oropharyngeal exudate.  Eyes:     General: No scleral icterus.    Extraocular Movements: Extraocular movements intact.     Conjunctiva/sclera: Conjunctivae normal.     Pupils: Pupils are equal, round, and reactive to light.  Cardiovascular:     Rate and Rhythm: Normal rate and regular rhythm.     Pulses: Normal pulses.     Heart sounds: Normal heart sounds. No murmur heard.    No friction rub. No gallop.  Pulmonary:     Effort: Pulmonary effort is normal.     Breath sounds: Normal breath sounds. No wheezing, rhonchi or rales.  Abdominal:     General: Bowel sounds are normal. There is no distension.     Palpations: Abdomen is soft.     Tenderness: There is no abdominal tenderness. There is no guarding or rebound.  Musculoskeletal:        General: No swelling. Normal range of motion.  Skin:     General: Skin is warm and dry.  Neurological:     General: No focal deficit present.     Mental Status: He is alert and oriented to person, place, and time.  Psychiatric:        Mood and Affect: Mood normal.        Behavior: Behavior normal.      Assessment & Plan:   See Encounters Tab for problem based charting.  Patient discussed with Dr. Oswaldo Done

## 2022-05-15 NOTE — Progress Notes (Signed)
Internal Medicine Clinic Attending  Case discussed with Dr. Jinwala  At the time of the visit.  We reviewed the resident's history and exam and pertinent patient test results.  I agree with the assessment, diagnosis, and plan of care documented in the resident's note.  

## 2022-05-21 ENCOUNTER — Other Ambulatory Visit: Payer: Self-pay

## 2022-05-21 ENCOUNTER — Encounter: Payer: Self-pay | Admitting: Family

## 2022-05-21 ENCOUNTER — Ambulatory Visit: Payer: Medicaid Other | Admitting: Family

## 2022-05-21 VITALS — BP 119/68 | HR 79 | Temp 97.6°F | Ht 77.0 in | Wt 261.0 lb

## 2022-05-21 DIAGNOSIS — B20 Human immunodeficiency virus [HIV] disease: Secondary | ICD-10-CM

## 2022-05-21 DIAGNOSIS — Z0000 Encounter for general adult medical examination without abnormal findings: Secondary | ICD-10-CM

## 2022-05-21 DIAGNOSIS — Z79899 Other long term (current) drug therapy: Secondary | ICD-10-CM | POA: Diagnosis not present

## 2022-05-21 MED ORDER — TIVICAY 50 MG PO TABS
50.0000 mg | ORAL_TABLET | Freq: Every day | ORAL | 11 refills | Status: DC
Start: 2022-05-21 — End: 2023-03-25

## 2022-05-21 MED ORDER — TIVICAY 50 MG PO TABS
50.0000 mg | ORAL_TABLET | Freq: Every day | ORAL | 5 refills | Status: DC
Start: 2022-05-21 — End: 2022-05-21

## 2022-05-21 MED ORDER — EMTRICITABINE-TENOFOVIR AF 200-25 MG PO TABS
1.0000 | ORAL_TABLET | Freq: Every day | ORAL | 11 refills | Status: DC
Start: 2022-05-21 — End: 2023-03-25

## 2022-05-21 MED ORDER — EMTRICITABINE-TENOFOVIR AF 200-25 MG PO TABS
1.0000 | ORAL_TABLET | Freq: Every day | ORAL | 5 refills | Status: DC
Start: 2022-05-21 — End: 2022-05-21

## 2022-05-21 NOTE — Assessment & Plan Note (Signed)
Giovoni continues to have well-controlled virus with good adherence and tolerance to Tivicay and Descovy.  Reviewed lab work and discussed plan of care.  Continue current dose of Tivicay and Descovy.  Plan for follow-up in 1 year or sooner if needed with lab work 1 to 2 weeks prior to appointment.

## 2022-05-21 NOTE — Assessment & Plan Note (Signed)
Discussed importance of safe sexual practice and condom use. Condoms and STD testing offered.  Routine dental care up-to-date. Routine vaccinations up-to-date. Scheduled for colonoscopy.

## 2022-05-21 NOTE — Patient Instructions (Addendum)
Nice to see you.  Continue to take your medication daily as prescribed.  Refills have been sent to the pharmacy.  Plan for follow up in 1 year or sooner if needed with lab work 1-2 weeks prior to appointment.   Have a great day and stay safe!  

## 2022-05-21 NOTE — Progress Notes (Signed)
Brief Narrative   Patient ID: Chad Little, male    DOB: 1960/10/16, 62 y.o.   MRN: [id number]  Chad Little is a 62 year old African-American male diagnosed with HIV-1 in 1998 with risk factor heterosexual contact.  Initial viral load and CD4 count unavailable.  Genotype from 2009 with no significant medication resistance patterns.  No history of opportunistic infection. [id number] negative. ART regimen of Tivicay and Descovy.    Subjective:    Chief Complaint  Patient presents with   Follow-up   HIV Positive/AIDS    HPI:  Chad Little is a 62 y.o. male with HIV disease last seen on 11/15/2021 with well-controlled virus and good adherence and tolerance to Descovy and Tivicay.  Viral load was undetectable with CD4 count of 600.  Renal function, hepatic function, and electrolytes within normal ranges.  Most recent lab work completed on 05/07/2022 with viral load that remains undetectable and CD4 count of 702.  Renal function, liver function, electrolytes within normal ranges.  Lipid profile normal with triglycerides 45, LDL 47, and HDL 51.  Here today for routine follow-up.  Chad Little has been doing well since his last office visit with no new concerns/complaints.  Continues to take Tivicay and Descovy with no adverse side effects and no problems obtaining medication from the pharmacy.  Currently enjoying life.  Condoms and STD testing offered.  Vaccinations up-to-date.  Planning for scheduled colonoscopy in the next month.  Denies fevers, chills, night sweats, headaches, changes in vision, neck pain/stiffness, nausea, diarrhea, vomiting, lesions or rashes.   No Known Allergies    Outpatient Medications Prior to Visit  Medication Sig Dispense Refill   atorvastatin (LIPITOR) 10 MG tablet Take 1 tablet (10 mg total) by mouth daily. 30 tablet 11   fluticasone (FLONASE) 50 MCG/ACT nasal spray Place 1 spray into both nostrils 2 (two) times daily. 18.2 mL 2   ketoconazole  (NIZORAL) 2 % cream Apply 1 Application topically 2 (two) times daily. 60 g 0   Olmesartan-amLODIPine-HCTZ (TRIBENZOR) 40-5-25 MG TABS Take 1 tablet by mouth daily. 30 tablet 2   sildenafil (VIAGRA) 50 MG tablet Take 1 tablet (50 mg total) by mouth as needed for erectile dysfunction. 20 tablet 1   dolutegravir (TIVICAY) 50 MG tablet Take 1 tablet (50 mg total) by mouth daily. 30 tablet 5   emtricitabine-tenofovir AF (DESCOVY) 200-25 MG tablet Take 1 tablet by mouth daily. 30 tablet 5   methocarbamol (ROBAXIN) 500 MG tablet Take 1 tablet (500 mg total) by mouth 2 (two) times daily. (Patient not taking: Reported on 05/21/2022) 30 tablet 0   pantoprazole (PROTONIX) 40 MG tablet Take 1 tablet (40 mg total) by mouth 2 (two) times daily. 180 tablet 0   traMADol (ULTRAM) 50 MG tablet Take 1 tablet (50 mg total) by mouth every 6 (six) hours as needed. (Patient not taking: Reported on 05/21/2022) 15 tablet 0   No facility-administered medications prior to visit.     Past Medical History:  Diagnosis Date   Arthritis    Enlarged prostate    Essential hypertension    GERD (gastroesophageal reflux disease)    Gout    diet controlled no recent flare ups   HIV infection    Superficial vein thrombosis 08/08/2020     Past Surgical History:  Procedure Laterality Date   APPENDECTOMY  2005   open   Right elbow surgery  dec3 2020   in [place]   TRANSURETHRAL RESECTION OF PROSTATE N/A 02/14/2020  Procedure: TRANSURETHRAL RESECTION OF THE PROSTATE (TURP), BIPOLAR;  Surgeon: Jannifer Hick, MD;  Location: [facility];  Service: Urology;  Laterality: N/A;      Review of Systems  Constitutional:  Negative for appetite change, chills, fatigue, fever and unexpected weight change.  Eyes:  Negative for visual disturbance.  Respiratory:  Negative for cough, chest tightness, shortness of breath and wheezing.   Cardiovascular:  Negative for chest pain and leg swelling.  Gastrointestinal:   Negative for abdominal pain, constipation, diarrhea, nausea and vomiting.  Genitourinary:  Negative for dysuria, flank pain, frequency, genital sores, hematuria and urgency.  Skin:  Negative for rash.  Allergic/Immunologic: Negative for immunocompromised state.  Neurological:  Negative for dizziness and headaches.      Objective:    BP 119/68   Pulse 79   Temp 97.6 F (36.4 C) (Oral)   Ht  (1.956 m)   Wt 261 lb (118.4 kg)   SpO2 96%   BMI 30.95 kg/m  Nursing note and vital signs reviewed.  Physical Exam Constitutional:      General: He is not in acute distress.    Appearance: He is well-developed.  Eyes:     Conjunctiva/sclera: Conjunctivae normal.  Cardiovascular:     Rate and Rhythm: Normal rate and regular rhythm.     Heart sounds: Normal heart sounds. No murmur heard.    No friction rub. No gallop.  Pulmonary:     Effort: Pulmonary effort is normal. No respiratory distress.     Breath sounds: Normal breath sounds. No wheezing or rales.  Chest:     Chest wall: No tenderness.  Abdominal:     General: Bowel sounds are normal.     Palpations: Abdomen is soft.     Tenderness: There is no abdominal tenderness.  Musculoskeletal:     Cervical back: Neck supple.  Lymphadenopathy:     Cervical: No cervical adenopathy.  Skin:    General: Skin is warm and dry.     Findings: No rash.  Neurological:     Mental Status: He is alert and oriented to person, place, and time.  Psychiatric:        Behavior: Behavior normal.        Thought Content: Thought content normal.        Judgment: Judgment normal.         05/21/2022    9:10 AM 05/14/2022    9:22 AM 04/11/2022    3:23 PM 12/21/2021    9:07 AM 11/15/2021   10:53 AM  Depression screen PHQ 2/9  Decreased Interest 0 0 2 0 0  Down, Depressed, Hopeless 0 0 0 0 0  PHQ - 2 Score 0 0 2 0 0  Altered sleeping  0 0 0   Tired, decreased energy  0 2 0   Change in appetite  0 0 0   Feeling bad or failure about yourself   0  0 0   Trouble concentrating  0 0 0   Moving slowly or fidgety/restless  0 0 0   Suicidal thoughts  0 0 0   PHQ-9 Score  0 4 0   Difficult doing work/chores  Not difficult at all Not difficult at all Not difficult at all        Assessment & Plan:    Patient Active Problem List   Diagnosis Date Noted   Fatigue 03/05/2022   Seborrheic dermatitis 12/21/2021   Medial epicondylitis of left elbow 12/21/2021  Blurry vision, left eye 09/20/2021   Erectile dysfunction 08/15/2021   Lightheadedness 08/10/2021   Varicose veins of left lower extremity 08/10/2021   Arthritis of carpometacarpal (CMC) joint of left thumb 07/13/2021   Nail dystrophy 06/08/2021   Thoracic back pain 05/02/2021   Thumb pain, left 01/15/2021   Sprain of tibiofibular ligament of left ankle 01/15/2021   BPPV (benign paroxysmal positional vertigo), bilateral 10/23/2020   Seasonal allergic rhinitis 07/06/2020   GERD (gastroesophageal reflux disease) 06/13/2020   S/P TURP 03/22/2020   Healthcare maintenance 05/25/2019   Prediabetes 04/14/2019   Episodic tension-type headache, not intractable 09/30/2015   Hypertension 10/31/2014   Onychomycosis 12/11/2011   HIV disease (HCC) 12/28/2007   Osteoarthritis 12/28/2007     Problem List Items Addressed This Visit       Other   HIV disease (HCC) - Primary (Chronic)    [place] continues to have well-controlled virus with good adherence and tolerance to Tivicay and Descovy.  Reviewed lab work and discussed plan of care.  Continue current dose of Tivicay and Descovy.  Plan for follow-up in 1 year or sooner if needed with lab work 1 to 2 weeks prior to appointment.      Relevant Medications   dolutegravir (TIVICAY) 50 MG tablet   emtricitabine-tenofovir AF (DESCOVY) 200-25 MG tablet   Other Relevant Orders   COMPLETE METABOLIC PANEL WITH GFR   HIV-1 RNA quant-no reflex-bld   T-helper cell (CD4)- ([facility] clinic only)   Healthcare maintenance    Discussed importance  of safe sexual practice and condom use. Condoms and STD testing offered.  Routine dental care up-to-date. Routine vaccinations up-to-date. Scheduled for colonoscopy.      Other Visit Diagnoses     Pharmacologic therapy       Relevant Orders   Lipid panel        I am having Chad Quaker "Thayer Ohm" maintain his traMADol, methocarbamol, atorvastatin, pantoprazole, sildenafil, Olmesartan-amLODIPine-HCTZ, ketoconazole, fluticasone, Tivicay, and emtricitabine-tenofovir AF.   Meds ordered this encounter  Medications   DISCONTD: dolutegravir (TIVICAY) 50 MG tablet    Sig: Take 1 tablet (50 mg total) by mouth daily.    Dispense:  30 tablet    Refill:  5    Order Specific Question:   Supervising Provider    Answer:   Judyann Munson [[id number]]   DISCONTD: emtricitabine-tenofovir AF (DESCOVY) 200-25 MG tablet    Sig: Take 1 tablet by mouth daily.    Dispense:  30 tablet    Refill:  5    Order Specific Question:   Supervising Provider    Answer:   Drue Second, CYNTHIA [[id number]]   dolutegravir (TIVICAY) 50 MG tablet    Sig: Take 1 tablet (50 mg total) by mouth daily.    Dispense:  30 tablet    Refill:  11    Please discontinue previous prescription    Order Specific Question:   Supervising Provider    Answer:   Judyann Munson [[id number]]   emtricitabine-tenofovir AF (DESCOVY) 200-25 MG tablet    Sig: Take 1 tablet by mouth daily.    Dispense:  30 tablet    Refill:  11    Please cancel previous prescription    Order Specific Question:   Supervising Provider    Answer:   Judyann Munson [[id number]]     Follow-up: Return in about 1 year (around 05/21/2023).   Marcos Eke, MSN, FNP-C Nurse Practitioner [facility] Main number: [phone]

## 2022-06-04 ENCOUNTER — Telehealth: Payer: Self-pay

## 2022-06-04 DIAGNOSIS — J302 Other seasonal allergic rhinitis: Secondary | ICD-10-CM

## 2022-06-04 MED ORDER — FLUTICASONE PROPIONATE 50 MCG/ACT NA SUSP: 1.0000 | Freq: Two times a day (BID) | NASAL | 2 refills | Status: DC

## 2022-06-04 NOTE — Telephone Encounter (Signed)
Incoming fax from pharmacy Medication:Fluticasone Message to prescriber: "pt needs new rx with bid dosing for insurance"

## 2022-06-05 DIAGNOSIS — Z419 Encounter for procedure for purposes other than remedying health state, unspecified: Secondary | ICD-10-CM | POA: Diagnosis not present

## 2022-06-05 DIAGNOSIS — Z86010 Personal history of colonic polyps: Secondary | ICD-10-CM | POA: Diagnosis not present

## 2022-06-10 ENCOUNTER — Other Ambulatory Visit: Payer: Self-pay | Admitting: Student

## 2022-06-10 DIAGNOSIS — I10 Essential (primary) hypertension: Secondary | ICD-10-CM

## 2022-07-02 ENCOUNTER — Ambulatory Visit: Payer: Medicaid Other | Admitting: Student

## 2022-07-02 ENCOUNTER — Other Ambulatory Visit: Payer: Self-pay

## 2022-07-02 ENCOUNTER — Encounter: Payer: Self-pay | Admitting: Student

## 2022-07-02 VITALS — BP 102/83 | HR 82 | Temp 98.2°F | Ht 77.0 in | Wt 257.8 lb

## 2022-07-02 DIAGNOSIS — J019 Acute sinusitis, unspecified: Secondary | ICD-10-CM | POA: Diagnosis not present

## 2022-07-02 DIAGNOSIS — K0889 Other specified disorders of teeth and supporting structures: Secondary | ICD-10-CM | POA: Diagnosis not present

## 2022-07-02 DIAGNOSIS — R21 Rash and other nonspecific skin eruption: Secondary | ICD-10-CM | POA: Diagnosis not present

## 2022-07-02 MED ORDER — TRIAMCINOLONE ACETONIDE 0.1 % EX CREA
1.0000 | TOPICAL_CREAM | Freq: Two times a day (BID) | CUTANEOUS | 0 refills | Status: AC
Start: 2022-07-02 — End: ?

## 2022-07-02 NOTE — Assessment & Plan Note (Signed)
Presents with left mandibular molar pain for about 2 weeks. No recent injury or trauma. Hx of dental caries. Would like to see dentist for further evaluation. Will send referral for dentistry today.

## 2022-07-02 NOTE — Assessment & Plan Note (Addendum)
Patient reports left facial fullness and sinus congestion for past 2-3 weeks. Was in [place] last week, states he saw a NP there and was prescribed Amoxicillin x 5 days which he just completed. Overall he is feeling improved with less facial pain and congestion. Afebrile and no sinus or facial tenderness. Has been using nasal saline spray. He has seasonal allergic rhinitis which he takes Flonase, discussed he could take oral antihistamine. Given symptoms resolving, advised patient to monitor for changes.

## 2022-07-02 NOTE — Assessment & Plan Note (Signed)
Patient presents with chronic rash over his chest. Describes as pruritic but not painful. Notes rash unchanged for several months. Unable to recall any triggers or changes. Has not tried anything and nothing has made it worse. Denies fever, chills, n/v. No blistering or spread of rash. Exam showed maculopapular rash that is dry over central chest.

## 2022-07-02 NOTE — Patient Instructions (Addendum)
Thank you, [name] for allowing [place] to provide your care today. Today we discussed your recent sinus infection and rashes.  -I am glad to hear your sinus infection is improving. Continue with saline spray as needed. -For your seasonal allergies, you can continue Flonase and start Allegra since that helped before.   -For your rash, you can try over-the-counter steroid cream such as Cortisone. You can also apply over-the-counter emollients over the rash. In case, I sent over triamcinolone cream to see if that helps with your rash. If no improvement, we can do further evaluation or possible skin biopsy.  -Referral to see the dentist placed today.  -If your sinus infection or rashes do not improve over next few weeks, you can come back into the office.    Referrals ordered today:   Referral Orders         Ambulatory referral to Dentistry       I have ordered the following medication/changed the following medications:   Stop the following medications: There are no discontinued medications.   Start the following medications: Meds ordered this encounter  Medications   triamcinolone cream (KENALOG) 0.1 %    Sig: Apply 1 Application topically 2 (two) times daily.    Dispense:  30 g    Refill:  0     Follow up: 2-3 months   Should you have any questions or concerns please call the internal medicine clinic at [phone].    Rana Snare, D.O. [facility]

## 2022-07-02 NOTE — Progress Notes (Unsigned)
CC: sinus infection, rash  HPI:  Chad Little is a 62 y.o. male living with a history stated below and presents today for sinus infection and rash. Please see problem based assessment and plan for additional details.  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 30 tablet 11   dolutegravir (TIVICAY) 50 MG tablet Take 1 tablet (50 mg total) by mouth daily. 30 tablet 11   emtricitabine-tenofovir AF (DESCOVY) 200-25 MG tablet Take 1 tablet by mouth daily. 30 tablet 11   fluticasone (FLONASE) 50 MCG/ACT nasal spray Place 1 spray into both nostrils 2 (two) times daily. 18.2 mL 2   ketoconazole (NIZORAL) 2 % cream Apply 1 Application topically 2 (two) times daily. 60 g 0   methocarbamol (ROBAXIN) 500 MG tablet Take 1 tablet (500 mg total) by mouth 2 (two) times daily. (Patient not taking: Reported on 05/21/2022) 30 tablet 0   Olmesartan-amLODIPine-HCTZ 40-5-25 MG TABS TAKE 1 TABLET BY MOUTH DAILY 30 tablet 2   pantoprazole (PROTONIX) 40 MG tablet Take 1 tablet (40 mg total) by mouth 2 (two) times daily. 180 tablet 0   sildenafil (VIAGRA) 50 MG tablet Take 1 tablet (50 mg total) by mouth as needed for erectile dysfunction. 20 tablet 1   traMADol (ULTRAM) 50 MG tablet Take 1 tablet (50 mg total) by mouth every 6 (six) hours as needed. (Patient not taking: Reported on 05/21/2022) 15 tablet 0   No current facility-administered medications on file prior to visit.   Review of Systems: ROS negative except for what is noted on the assessment and plan.  Vitals:   07/02/22 1031  BP: 102/83  Pulse: 82  Temp:  98.2 F (36.8 C)  TempSrc: Oral  SpO2: 97%  Weight: 257 lb 12.8 oz (116.9 kg)  Height: 6\' 5"  (1.956 m)   Physical Exam: Constitutional: well-appearing male sitting in chair comfortably, in no acute distress HENT: normocephalic atraumatic, mucous membranes moist, no sinus or facial tenderness to palpation Cardiovascular: regular rate Pulmonary/Chest: normal work of breathing on room air, lungs clear to auscultation bilaterally MSK: normal bulk and tone Neurological: alert & oriented x 3 Skin: warm and dry, maculopapular rash over central chest, no blistering, bleeding or drainage Psych: pleasant mood   Assessment & Plan:   Tooth pain Presents with left mandibular molar pain for about 2 weeks. No recent injury or trauma. Hx of dental caries. Would like to see dentist for further evaluation. Will send referral for dentistry today.   Acute sinusitis Patient reports left facial fullness and sinus congestion for past 2-3 weeks. Was in [place] last week, states he saw a NP there and was prescribed Amoxicillin x 5 days which he just completed on Saturday. Overall he is feeling improved with less facial pain and congestion. Afebrile and no sinus or facial tenderness. Has been using nasal saline spray. He has seasonal allergic rhinitis which he takes Flonase, discussed he could take oral antihistamine. Given symptoms resolving, advised patient to monitor for changes.   Rash Patient presents with chronic rash over his chest.  Describes as pruritic but not painful. Notes rash unchanged for several months if not close to a year. Unable to recall any triggers or changes. Does not shave the area. Has not tried anything and nothing has made it worse. Denies fever, chills, n/v. No blistering or spread of rash. Exam showed maculopapular rash that is dry over central chest. No linear distribution. Ddx includes psoriasis, tinea corporis, seborrheic dermatitis, eczema. Will try topical steroid cream at this time.  Return precautions given if rash does not improve.    Patient discussed with Dr. Rollene Fare, D.O. [facility], PGY-1 Phone: [phone] Date 07/02/2022 Time 11:01 AM

## 2022-07-04 ENCOUNTER — Other Ambulatory Visit (HOSPITAL_COMMUNITY): Payer: Self-pay

## 2022-07-06 DIAGNOSIS — Z419 Encounter for procedure for purposes other than remedying health state, unspecified: Secondary | ICD-10-CM | POA: Diagnosis not present

## 2022-07-08 NOTE — Progress Notes (Signed)
Internal Medicine Clinic Attending  Case discussed with Dr. Zheng  At the time of the visit.  We reviewed the resident's history and exam and pertinent patient test results.  I agree with the assessment, diagnosis, and plan of care documented in the resident's note.  

## 2022-07-09 ENCOUNTER — Encounter: Payer: Self-pay | Admitting: *Deleted

## 2022-07-09 DIAGNOSIS — Z1211 Encounter for screening for malignant neoplasm of colon: Secondary | ICD-10-CM | POA: Diagnosis not present

## 2022-07-09 DIAGNOSIS — K635 Polyp of colon: Secondary | ICD-10-CM | POA: Diagnosis not present

## 2022-07-09 DIAGNOSIS — Z86010 Personal history of colonic polyps: Secondary | ICD-10-CM | POA: Diagnosis not present

## 2022-07-09 LAB — HM COLONOSCOPY

## 2022-07-14 ENCOUNTER — Other Ambulatory Visit: Payer: Self-pay | Admitting: Student

## 2022-07-16 DIAGNOSIS — K635 Polyp of colon: Secondary | ICD-10-CM | POA: Diagnosis not present

## 2022-07-31 ENCOUNTER — Other Ambulatory Visit: Payer: Self-pay

## 2022-07-31 MED ORDER — SILDENAFIL CITRATE 50 MG PO TABS: 50.0000 mg | ORAL_TABLET | ORAL | 1 refills | Status: DC | PRN

## 2022-08-05 DIAGNOSIS — Z419 Encounter for procedure for purposes other than remedying health state, unspecified: Secondary | ICD-10-CM | POA: Diagnosis not present

## 2022-08-19 DIAGNOSIS — K648 Other hemorrhoids: Secondary | ICD-10-CM | POA: Diagnosis not present

## 2022-08-19 DIAGNOSIS — R030 Elevated blood-pressure reading, without diagnosis of hypertension: Secondary | ICD-10-CM | POA: Diagnosis not present

## 2022-08-19 DIAGNOSIS — Q438 Other specified congenital malformations of intestine: Secondary | ICD-10-CM | POA: Diagnosis not present

## 2022-08-19 DIAGNOSIS — Z6830 Body mass index (BMI) 30.0-30.9, adult: Secondary | ICD-10-CM | POA: Diagnosis not present

## 2022-08-19 DIAGNOSIS — E6609 Other obesity due to excess calories: Secondary | ICD-10-CM | POA: Diagnosis not present

## 2022-08-19 DIAGNOSIS — D369 Benign neoplasm, unspecified site: Secondary | ICD-10-CM | POA: Diagnosis not present

## 2022-08-22 ENCOUNTER — Encounter: Payer: Self-pay | Admitting: Student

## 2022-08-22 ENCOUNTER — Ambulatory Visit: Payer: Medicaid Other | Admitting: Student

## 2022-08-22 VITALS — BP 118/90 | HR 75 | Temp 97.9°F | Wt 259.7 lb

## 2022-08-22 DIAGNOSIS — I82890 Acute embolism and thrombosis of other specified veins: Secondary | ICD-10-CM

## 2022-08-22 DIAGNOSIS — B354 Tinea corporis: Secondary | ICD-10-CM

## 2022-08-22 DIAGNOSIS — R7303 Prediabetes: Secondary | ICD-10-CM | POA: Diagnosis not present

## 2022-08-22 LAB — POCT GLYCOSYLATED HEMOGLOBIN (HGB A1C): Hemoglobin A1C: 6 % — AB (ref 4.0–5.6)

## 2022-08-22 LAB — GLUCOSE, CAPILLARY: Glucose-Capillary: 96 mg/dL (ref 70–99)

## 2022-08-22 MED ORDER — KETOCONAZOLE 2 % EX CREA: 1.0000 | TOPICAL_CREAM | Freq: Two times a day (BID) | CUTANEOUS | 0 refills | Status: DC

## 2022-08-22 NOTE — Progress Notes (Signed)
Subjective:  CC: A1c follow up, rash, leg pain  HPI:  [name] is a 62 y.o. male with a past medical history stated below and presents today for routine follow up, rash and leg pain. Please see problem based assessment and plan for additional details.  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 30 tablet 11   dolutegravir (TIVICAY) 50 MG tablet Take 1 tablet (50 mg total) by mouth daily. 30 tablet 11   emtricitabine-tenofovir AF (DESCOVY) 200-25 MG tablet Take 1 tablet by mouth daily. 30 tablet 11   fluticasone (FLONASE) 50 MCG/ACT nasal spray Place 1 spray into both nostrils 2 (two) times daily. 18.2 mL 2   Olmesartan-amLODIPine-HCTZ 40-5-25 MG TABS TAKE 1 TABLET BY MOUTH DAILY 30 tablet 2   pantoprazole (PROTONIX) 40 MG tablet TAKE 1 TABLET(40 MG) BY MOUTH TWICE DAILY 180 tablet 0   sildenafil (VIAGRA) 50 MG tablet Take 1 tablet (50 mg total) by mouth as needed for erectile dysfunction. 20 tablet 1   triamcinolone cream (KENALOG) 0.1 % Apply 1 Application topically 2 (two) times daily. 30 g 0   methocarbamol (ROBAXIN) 500 MG tablet Take 1 tablet (500 mg total) by mouth 2 (two) times daily. (Patient not taking: Reported on 05/21/2022) 30 tablet 0   traMADol (ULTRAM) 50 MG tablet Take 1 tablet (50 mg total) by mouth every 6 (six) hours as needed. (Patient not taking: Reported on 08/22/2022) 15 tablet 0   No current facility-administered medications on file prior to visit.    Family History  Problem Relation Age of Onset   Alcohol abuse Mother    Hypertension Father    Stroke Father      Social History   Socioeconomic History   Marital status: Single    Spouse name: Not on file   Number of children: Not on file   Years of education: Not on file   Highest education level: Not on file  Occupational History   Not on file  Tobacco Use   Smoking status: Never   Smokeless tobacco: Never  Vaping Use   Vaping status: Never Used  Substance and Sexual Activity   Alcohol use: Not Currently    Alcohol/week: 0.0 standard drinks of alcohol    Comment: none since 2017   Drug use: Not Currently    Types: Marijuana, "Crack" cocaine    Comment:  cocaine and marijuana none since 2017   Sexual activity: Not Currently    Comment: refused  condoms 11/09/2019  Other Topics Concern   Not on file  Social History Narrative   Not on file   Social Determinants of Health   Financial Resource Strain: Not on file  Food Insecurity: No Food Insecurity (03/05/2022)   Hunger Vital Sign    Worried About Running Out of Food in the Last Year: Never true    Ran Out of Food in the Last Year: Never true  Transportation Needs: No Transportation Needs (03/05/2022)   PRAPARE - Administrator, Civil Service (Medical): No  Lack of Transportation (Non-Medical): No  Physical Activity: Not on file  Stress: Not on file  Social Connections: Moderately Isolated (03/05/2022)   Social Connection and Isolation Panel [NHANES]    Frequency of Communication with Friends and Family: More than three times a week    Frequency of Social Gatherings with Friends and Family: More than three times a week    Attends Religious Services: More than 4 times per year    Active Member of [employer] or Organizations: No    Attends Banker Meetings: Never    Marital Status: Never married  Intimate Partner Violence: Not At Risk (03/05/2022)   Humiliation, Afraid, Rape, and Kick questionnaire    Fear of Current or Ex-Partner: No    Emotionally Abused: No    Physically Abused: No    Sexually Abused:  No    Review of Systems: ROS negative except for what is noted on the assessment and plan.  Objective:   Vitals:   08/22/22 1444 08/22/22 1516  BP: (!) 120/93 (!) 118/90  Pulse: 79 75  Temp: 97.9 F (36.6 C)   TempSrc: Oral   SpO2: 95%   Weight: 259 lb 11.2 oz (117.8 kg)     Physical Exam: Constitutional: well-appearing in no acute distress HENT: normocephalic atraumatic, mucous membranes moist Eyes: conjunctiva non-erythematous Neck: supple Cardiovascular: regular rate and rhythm, no m/r/g Pulmonary/Chest: normal work of breathing on room air, lungs clear to auscultation bilaterally Skin: Please see assessment and plan for more information  Psych: normal mood and affect     Assessment & Plan:  Superficial vein thrombosis Patient presents today with pain of the left lower extremity.  The pain is mostly on the medial side of the inferior portion of the shin.  The pain has been present for 1 week, and has not changed in intensity.  There is swelling and erythema of the area.  Photos of the patient's left lower extremity are in the chart.  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  There is no tenderness to palpation of the calf, or generalized swelling of the left lower extremity.  Differential diagnosis would include superficial venous thrombosis, versus cellulitis.  Less concern for DVT given lack of calf tenderness, generalized erythema and swelling of the left lower extremity.  Less concern for cellulitis since the patient has no signs of infection (fever, night sweats, chills).  Plan: - Conservative management of superficial venous thrombosis discussed with the patient. --This includes warm compress, elevation of the foot, continued ambulation. -Signs of cellulitis, DVT, or VTE discussed with the patient.  Return precautions given. -Patient informed to alert [place] if symptoms are not resolved in  the coming days.  Consider duplex ultrasonography or antibiotics.  Tinea corporis Well-demarcated, pruritic rash with central scaling appreciated on the upper back.  Photograph available in the chart.  Plan: Ketoconazole 2% ointment  Prediabetes Patient has a history of obesity HbA1c.  Last HbA1c 6.2%.  Repeat HbA1c today improved to 6.0%.  Plan: Discussed diet and exercise modification. Follow-up 3 months for A1c check. Start goal for 5 pound weight loss by next visit.   Patient seen with Dr. Lajean Silvius MD [facility]  PGY-1 Pager: [phone]  Phone: [phone] Date 08/22/2022  Time 8:23 PM

## 2022-08-22 NOTE — Assessment & Plan Note (Addendum)
Patient presents today with pain of the left lower extremity.  The pain is mostly on the medial side of the inferior portion of the shin.  The pain has been present for 1 week, and has not changed in intensity.  There is swelling and erythema of the area.  Photos of the patient's left lower extremity are in the chart.  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  There is no tenderness to palpation of the calf, or generalized swelling of the left lower extremity.  Differential diagnosis would include superficial venous thrombosis, versus cellulitis.  Less concern for DVT given lack of calf tenderness, generalized erythema and swelling of the left lower extremity.  Less concern for cellulitis since the patient has no signs of infection (fever, night sweats, chills).  Plan: - Conservative management of superficial venous thrombosis discussed with the patient. --This includes warm compress, elevation of the foot, continued ambulation. -Signs of cellulitis, DVT, or VTE discussed with the patient.  Return precautions given. -Patient informed to alert [place] if symptoms are not resolved in the coming days.  Consider duplex ultrasonography or antibiotics.

## 2022-08-22 NOTE — Patient Instructions (Signed)
Thank you, Chad Little for allowing [place] to provide your care today. Today we discussed blood sugar, skin rash, leg pain.    I have ordered the following labs for you:   Lab Orders         POC Hbg A1C      Referrals ordered today:   Referral Orders  No referral(s) requested today     I have ordered the following medication/changed the following medications:   Stop the following medications: Medications Discontinued During This Encounter  Medication Reason   ketoconazole (NIZORAL) 2 % cream Reorder     Start the following medications: Meds ordered this encounter  Medications   ketoconazole (NIZORAL) 2 % cream    Sig: Apply 1 Application topically 2 (two) times daily.    Dispense:  60 g    Refill:  0     Follow up:  3 months, or as needed for your pain    We look forward to seeing you next time. Please call our clinic at [phone] if you have any questions or concerns. The best time to call is Monday-Friday from 9am-4pm, but there is someone available 24/7. If after hours or the weekend, call the main hospital number and ask for the Internal Medicine Resident On-Call. If you need medication refills, please notify your pharmacy one week in advance and they will send [place] a request.   Thank you for trusting me with your care. Wishing you the best!  Lovie Macadamia MD [facility]

## 2022-08-22 NOTE — Assessment & Plan Note (Signed)
Well-demarcated, pruritic rash with central scaling appreciated on the upper back.  Photograph available in the chart.  Plan: Ketoconazole 2% ointment

## 2022-08-22 NOTE — Assessment & Plan Note (Signed)
Patient has a history of obesity HbA1c.  Last HbA1c 6.2%.  Repeat HbA1c today improved to 6.0%.  Plan: Discussed diet and exercise modification. Follow-up 3 months for A1c check. Start goal for 5 pound weight loss by next visit.

## 2022-08-23 MED ORDER — KETOCONAZOLE 2 % EX CREA
1.0000 | TOPICAL_CREAM | Freq: Two times a day (BID) | CUTANEOUS | 0 refills | Status: DC
Start: 2022-08-23 — End: 2023-04-15

## 2022-09-03 NOTE — Progress Notes (Signed)
Internal Medicine Clinic Attending  Case discussed with the resident at the time of the visit.  We reviewed the resident's history and exam and pertinent patient test results.  I agree with the assessment, diagnosis, and plan of care documented in the resident's note.  Debe Coder, MD

## 2022-09-05 DIAGNOSIS — Z419 Encounter for procedure for purposes other than remedying health state, unspecified: Secondary | ICD-10-CM | POA: Diagnosis not present

## 2022-09-06 ENCOUNTER — Telehealth: Payer: Self-pay

## 2022-09-06 NOTE — Telephone Encounter (Signed)
Pt states he is having left leg pain, want to know what should he do. Please call pt back.

## 2022-09-06 NOTE — Telephone Encounter (Signed)
RTC to patient.  States has a hx of Varicose veins.  Discussed at list visit. States his leg is painful to touch and seems hard in the area.  Wants to know when he can come in for further follow up.  No available appointments in the Clinics today.   Will check with doctors to see if an appointment is appropriate in the Clinics or to have patient go to the ER

## 2022-09-06 NOTE — Telephone Encounter (Signed)
Returned call to patient. He was seen in our clinic on 07/18 and diagnosed with SVT of the LLE and advised to contact the clinic if his pain did not resolve over the next several days. The pain has not resolved which prompted him to call.   He states that the pain is unchanged and that there is no new warmth or erythema over the painful area. He does have some swelling of the LLE compared to RLE, however this is unchanged from that OV as well. He is not having any chest pain or dyspnea.   He explains that about 1 year ago he had similar symptoms and an ultrasound revealed a small blood clot which self resolved.  As his symptoms have not resolved, I think it is reasonable to have the patient RTC for re-assessment. I am not able to schedule this visit myself, but I do have a 10:15 AM slot open on Monday 08/05 and have told the patient that unless our clinic contacts him to tell him otherwise first thing Monday morning, to plan to come in at that time. ED precautions given including chest pain, shortness of breath, new warmth or erythema over the painful area, or worsened size discrepancies between his LE. He voices understanding and agreement with this plan.  Champ Mungo, DO

## 2022-09-09 ENCOUNTER — Ambulatory Visit: Payer: Medicaid Other | Admitting: Internal Medicine

## 2022-09-09 ENCOUNTER — Encounter: Payer: Self-pay | Admitting: Internal Medicine

## 2022-09-09 VITALS — BP 125/89 | HR 75 | Temp 98.4°F | Wt 257.6 lb

## 2022-09-09 DIAGNOSIS — I82890 Acute embolism and thrombosis of other specified veins: Secondary | ICD-10-CM | POA: Diagnosis not present

## 2022-09-09 DIAGNOSIS — I10 Essential (primary) hypertension: Secondary | ICD-10-CM | POA: Diagnosis not present

## 2022-09-09 MED ORDER — OLMESARTAN-AMLODIPINE-HCTZ 40-5-25 MG PO TABS
1.0000 | ORAL_TABLET | Freq: Every day | ORAL | 2 refills | Status: DC
Start: 2022-09-09 — End: 2022-12-05

## 2022-09-09 NOTE — Assessment & Plan Note (Signed)
BP 125/89. Compliant with regimen of olmesartan-amlodipine-hydrochlorothiazide 40-5-25 mg daily. He is requesting a refill of this today. Plan:Refill sent.

## 2022-09-09 NOTE — Progress Notes (Signed)
CC: LLE pain f/u  HPI:  ChadChad Little is a 62 y.o. male with past medical history as detailed below who presents today for follow up of LLE pain 2/2 suspected superficial vein thrombosis. Please see problem based charting for detailed assessment and plan.  Past Medical History:  Diagnosis Date   Arthritis    Enlarged prostate    Essential hypertension    GERD (gastroesophageal reflux disease)    Gout    diet controlled no recent flare ups   HIV infection (HCC)    Superficial vein thrombosis 08/08/2020   Review of Systems:  Negative unless otherwise stated.  Physical Exam:  Vitals:   09/09/22 1015  BP: 125/89  Pulse: 75  Temp: 98.4 F (36.9 C)  TempSrc: Oral  SpO2: 98%  Weight: 257 lb 9.6 oz (116.8 kg)   Constitutional:Well-appearing, in no acute distress. Cardio:Regular rate and rhythm. No murmurs, rubs, or gallops. Pulm:Clear to auscultation bilaterally. Normal work of breathing on room air. QMV:HQIONGEX for extremity edema. Skin:Warm and dry. Area of concern over inferomedial LLE without increased warmth or erythema. There are some what appear to be venous stasis changes over the inferomedial aspect of the LLE. There is a firmness with mild tenderness to palpation as well. No palpable cord extending proximally but patient does have varicose veins.  Neuro:Alert and oriented x3. No focal deficit noted. Psych:Pleasant mood and affect.  Assessment & Plan:   See Encounters Tab for problem based charting.  Hypertension BP 125/89. Compliant with regimen of olmesartan-amlodipine-hydrochlorothiazide 40-5-25 mg daily. He is requesting a refill of this today. Plan:Refill sent.  Superficial vein thrombosis Recently evaluated on 07/18 for suspected superficial vein thrombosis. He had pain of his LLE at the inferomedial aspect of the shin. At that time it had been present for about 1 week. There was swelling and erythema at that area but no tenderness to palpation of  the calf or generalized swelling of the LLE. Ultimately the recommendation was for conservative management including warm compress, elevation of the foot, and continuing to ambulate.  On 08/02 he called the clinic as his symptoms had not resolved and he was advised to let us know if this were the case. His pain was unchanged and there were no changes in terms of warmth or erythema to the area. He did not have chest pain or dyspnea at that time. I requested that he come in for a OV for further assessment.  On further assessment today the area of concern continues without increased warmth or erythema. There are some what appear to be venous stasis changes over the inferomedial aspect of the LLE. There is a firmness with mild tenderness to palpation as well. No palpable cord extending proximally but patient does have varicose veins. He says that his pain is improving gradually overall with use of tylenol as needed and heating pad.  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 Assessment:I feel that SVT was a reasonable diagnosis at his initial presentation especially given his history of SVT and physical exam findings. I am concerned about the persistence of pain to the area but am reassured that this has improved since we spoke on 08/02. Exam is not concerning at this time for phlebitis or DVT. Plan: Duplex US to further assess likely SVT. Continue symptomatic care for now. Patient has been instructed to contact the clinic at the end of this week if he does not have continued improvement of his symptoms. He has  been instructed to go to ED promptly if he develops chest pain, shortness of breath, increased warmth or erythema to the painful area, or new unilateral swelling of LLE.  Patient discussed with Dr. Criselda Peaches

## 2022-09-09 NOTE — Assessment & Plan Note (Signed)
Recently evaluated on 07/18 for suspected superficial vein thrombosis. He had pain of his LLE at the inferomedial aspect of the shin. At that time it had been present for about 1 week. There was swelling and erythema at that area but no tenderness to palpation of the calf or generalized swelling of the LLE. Ultimately the recommendation was for conservative management including warm compress, elevation of the foot, and continuing to ambulate.  On 08/02 he called the clinic as his symptoms had not resolved and he was advised to let us know if this were the case. His pain was unchanged and there were no changes in terms of warmth or erythema to the area. He did not have chest pain or dyspnea at that time. I requested that he come in for a OV for further assessment.  On further assessment today the area of concern continues without increased warmth or erythema. There are some what appear to be venous stasis changes over the inferomedial aspect of the LLE. There is a firmness with mild tenderness to palpation as well. No palpable cord extending proximally but patient does have varicose veins. He says that his pain is improving gradually overall with use of tylenol as needed and heating pad.  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 Assessment:I feel that SVT was a reasonable diagnosis at his initial presentation especially given his history of SVT and physical exam findings. I am concerned about the persistence of pain to the area but am reassured that this has improved since we spoke on 08/02. Exam is not concerning at this time for phlebitis or DVT. Plan: Duplex US to further assess likely SVT. Continue symptomatic care for now. Patient has been instructed to contact the clinic at the end of this week if he does not have continued improvement of his symptoms. He has been instructed to go to ED promptly if he develops chest pain, shortness of breath, increased warmth or erythema to the painful area, or  new unilateral swelling of LLE.

## 2022-09-09 NOTE — Patient Instructions (Signed)
Chad Little,  It was nice seeing you today! Thank you for choosing [facility] for your Primary Care.    I am glad that your pain is improving! I am going to order an ultrasound of your leg to better assess for clots and you will receive a call to schedule this. In the meantime please continue what you are already doing for the pain such as tylenol as needed, using a heating pad, walking.  If you develop chest pain, shortness of breath, worsened warmth or redness around the painful area, or your left lower leg becomes much more enlarged than the right lower leg, please to the ED for further evaluation.  Please let the clinic know if your pain does not continue to have improvement throughout the week this week.  My best, Dr. August Saucer

## 2022-09-09 NOTE — Telephone Encounter (Signed)
Patient to come in for appointment this morning at 10:15 AM.

## 2022-09-11 NOTE — Progress Notes (Signed)
Internal Medicine Clinic Attending  Case discussed with the resident at the time of the visit.  We reviewed the resident's history and exam and pertinent patient test results.  I agree with the assessment, diagnosis, and plan of care documented in the resident's note.  Debe Coder, MD

## 2022-09-12 ENCOUNTER — Ambulatory Visit (HOSPITAL_COMMUNITY)
Admission: RE | Admit: 2022-09-12 | Discharge: 2022-09-12 | Disposition: A | Discharge status: Home / Self Care | Payer: Medicaid Other | Source: Ambulatory Visit | Attending: Internal Medicine | Admitting: Internal Medicine

## 2022-09-12 DIAGNOSIS — I82890 Acute embolism and thrombosis of other specified veins: Secondary | ICD-10-CM

## 2022-10-06 DIAGNOSIS — Z419 Encounter for procedure for purposes other than remedying health state, unspecified: Secondary | ICD-10-CM | POA: Diagnosis not present

## 2022-10-17 ENCOUNTER — Other Ambulatory Visit: Payer: Self-pay

## 2022-10-21 ENCOUNTER — Telehealth: Payer: Self-pay | Admitting: Student

## 2022-10-21 MED ORDER — PANTOPRAZOLE SODIUM 40 MG PO TBEC: 40.0000 mg | DELAYED_RELEASE_TABLET | Freq: Every day | ORAL | 0 refills | Status: DC

## 2022-10-21 NOTE — Telephone Encounter (Signed)
Pt is following up with his medication request below on 10/17/2022. Pt states he has been taking Tums until he gets his refills but it is not helping.  Please call the patient back.  pantoprazole (PROTONIX) 40 MG tablet   [id number] - [place], [place] - [address] AT [facility] OF [address]

## 2022-10-21 NOTE — Telephone Encounter (Signed)
Pt was called and informed of pantoprazole refill.

## 2022-10-23 ENCOUNTER — Other Ambulatory Visit: Payer: Self-pay

## 2022-10-23 DIAGNOSIS — J302 Other seasonal allergic rhinitis: Secondary | ICD-10-CM

## 2022-10-24 MED ORDER — FLUTICASONE PROPIONATE 50 MCG/ACT NA SUSP
1.0000 | Freq: Two times a day (BID) | NASAL | 2 refills | Status: AC
Start: 2022-10-24 — End: ?

## 2022-11-05 DIAGNOSIS — Z419 Encounter for procedure for purposes other than remedying health state, unspecified: Secondary | ICD-10-CM | POA: Diagnosis not present

## 2022-11-07 ENCOUNTER — Ambulatory Visit: Payer: Medicaid Other | Admitting: Student

## 2022-11-07 VITALS — BP 132/85 | HR 77 | Temp 98.4°F | Wt 260.3 lb

## 2022-11-07 DIAGNOSIS — B9689 Other specified bacterial agents as the cause of diseases classified elsewhere: Secondary | ICD-10-CM | POA: Insufficient documentation

## 2022-11-07 DIAGNOSIS — J302 Other seasonal allergic rhinitis: Secondary | ICD-10-CM

## 2022-11-07 DIAGNOSIS — J329 Chronic sinusitis, unspecified: Secondary | ICD-10-CM | POA: Diagnosis not present

## 2022-11-07 DIAGNOSIS — Z23 Encounter for immunization: Secondary | ICD-10-CM

## 2022-11-07 MED ORDER — FLUTICASONE PROPIONATE 50 MCG/ACT NA SUSP
2.0000 | Freq: Two times a day (BID) | NASAL | 2 refills | Status: DC
Start: 2022-11-07 — End: 2023-01-08

## 2022-11-07 MED ORDER — ATORVASTATIN CALCIUM 10 MG PO TABS: 10.0000 mg | ORAL_TABLET | Freq: Every day | ORAL | 11 refills | Status: DC

## 2022-11-07 MED ORDER — AMOXICILLIN-POT CLAVULANATE 875-125 MG PO TABS: 1.0000 | ORAL_TABLET | Freq: Two times a day (BID) | ORAL | 0 refills | Status: AC

## 2022-11-07 NOTE — Patient Instructions (Signed)
Thank you, Chad Little for allowing [place] to provide your care today.    I have ordered the following medication/changed the following medications:   Stop the following medications: Medications Discontinued During This Encounter  Medication Reason   atorvastatin (LIPITOR) 10 MG tablet Reorder   fluticasone (FLONASE) 50 MCG/ACT nasal spray Reorder     Start the following medications: Meds ordered this encounter  Medications   fluticasone (FLONASE) 50 MCG/ACT nasal spray    Sig: Place 2 sprays into both nostrils 2 (two) times daily.    Dispense:  18.2 mL    Refill:  2   atorvastatin (LIPITOR) 10 MG tablet    Sig: Take 1 tablet (10 mg total) by mouth daily.    Dispense:  30 tablet    Refill:  11   amoxicillin-clavulanate (AUGMENTIN) 875-125 MG tablet    Sig: Take 1 tablet by mouth 2 (two) times daily for 7 days.    Dispense:  14 tablet    Refill:  0       Follow up:  3 months  for kidney fxn, A1c     We look forward to seeing you next time. Please call our clinic at [phone] if you have any questions or concerns. The best time to call is Monday-Friday from 9am-4pm, but there is someone available 24/7. If after hours or the weekend, call the main hospital number and ask for the Internal Medicine Resident On-Call. If you need medication refills, please notify your pharmacy one week in advance and they will send [place] a request.   Thank you for trusting me with your care. Wishing you the best!  Lovie Macadamia MD [facility]

## 2022-11-07 NOTE — Assessment & Plan Note (Addendum)
Patient has had some increased discharge from the left nare.  It is green and occasionally has dried blood in it.  He says it is foul-smelling.  Denies much sinus pain or pressure.  Denies fevers, chills, night sweats.  No focal neurological deficits or concerns for contiguous spread of infection. Plan: Continue treatment with nasal irrigation, Flonase, allergy medications. Prescribe a 7-day course of Augmentin for the patient should symptoms fail to improve in the next 2 to 3 days, or should they continue. Return precautions discussed with the patient, he is very proactive about his health and will let us know if he has any concerns.

## 2022-11-07 NOTE — Progress Notes (Signed)
    Subjective:  CC: Sinusitis  HPI:  [name] is a 62 y.o. person with a past medical history stated below and presents today for sinusitis. Please see problem based assessment and plan for additional details.  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 30 tablet 11   emtricitabine-tenofovir AF (DESCOVY) 200-25 MG tablet Take 1 tablet by mouth daily. 30 tablet 11   ketoconazole (NIZORAL) 2 % cream Apply 1 Application topically 2 (two) times daily. 60 g 0   methocarbamol (ROBAXIN) 500 MG tablet Take 1 tablet (500 mg total) by mouth 2 (two) times daily. (Patient not taking: Reported on 05/21/2022) 30 tablet 0   Olmesartan-amLODIPine-HCTZ 40-5-25 MG TABS Take 1 tablet by mouth daily. 30 tablet 2   pantoprazole (PROTONIX) 40 MG tablet Take 1 tablet (40 mg total) by mouth daily. 180 tablet 0   sildenafil (VIAGRA) 50 MG tablet Take 1 tablet (50 mg total) by mouth as needed for erectile dysfunction. 20 tablet 1   traMADol (ULTRAM) 50 MG tablet Take 1 tablet (50 mg total) by mouth every 6 (six) hours as needed. (Patient not taking: Reported on 08/22/2022) 15 tablet 0   triamcinolone cream (KENALOG) 0.1 % Apply 1 Application topically 2 (two) times daily. 30 g 0   No current facility-administered medications on file prior to visit.    Review of Systems: Please see assessment and plan for pertinent positives and negatives.  Objective:   Vitals:   11/07/22 1001  BP: 132/85  Pulse: 77  Temp: 98.4 F (36.9 C)  TempSrc: Oral  SpO2: 100%  Weight: 260 lb 4.8 oz (118.1 kg)    Physical  Exam: Constitutional: Well-appearing, in no acute distress Cardiovascular: regular rate and rhythm, no m/r/g Pulmonary/Chest: normal work of breathing on room air, lungs clear to auscultation bilaterally Abdominal: soft, non-tender, non-distended Extremities: No edema of the lower extremities bilaterally Skin: warm and dry.  Slight erythema and palpable cordlike structure of the left lower extremity, in setting of known recurrent superficial vein thrombosis. Psych: Pleasant mood and affect   Assessment & Plan:  Bacterial sinusitis Patient has had some increased discharge from the left nare.  It is green and occasionally has dried blood in it.  He says it is foul-smelling.  Denies much sinus pain or pressure.  Denies fevers, chills, night sweats.  No focal neurological deficits or concerns for contiguous spread of infection. Plan: Continue treatment with nasal irrigation, Flonase, allergy medications. Prescribe a 7-day course of Augmentin for the patient should symptoms fail to improve in the next 2 to 3 days, or should they continue.    Patient seen with Dr. Alver Sorrow MD [facility]  PGY-1 Pager: [phone]  Phone: [phone] Date 11/07/2022  Time 4:59 PM

## 2022-11-08 NOTE — Progress Notes (Signed)
Internal Medicine Clinic Attending  I was physically present during the key portions of the resident provided service and participated in the medical decision making of patient's management care. I reviewed pertinent patient test results.  The assessment, diagnosis, and plan were formulated together and I agree with the documentation in the resident's note.  Reymundo Poll, MD

## 2022-11-20 ENCOUNTER — Telehealth: Payer: Self-pay | Admitting: Student

## 2022-11-20 NOTE — Telephone Encounter (Signed)
atorvastatin (LIPITOR) 10 MG tablet   [id number] - [place], [place] - [address] AT [facility] OF [address]

## 2022-12-02 ENCOUNTER — Other Ambulatory Visit: Payer: Self-pay

## 2022-12-02 MED ORDER — PANTOPRAZOLE SODIUM 40 MG PO TBEC: 40.0000 mg | DELAYED_RELEASE_TABLET | Freq: Every day | ORAL | 0 refills | Status: DC

## 2022-12-05 ENCOUNTER — Other Ambulatory Visit: Payer: Self-pay | Admitting: *Deleted

## 2022-12-05 ENCOUNTER — Other Ambulatory Visit (HOSPITAL_COMMUNITY): Payer: Self-pay

## 2022-12-05 DIAGNOSIS — I10 Essential (primary) hypertension: Secondary | ICD-10-CM

## 2022-12-05 MED ORDER — SILDENAFIL CITRATE 50 MG PO TABS
50.0000 mg | ORAL_TABLET | ORAL | 1 refills | Status: DC | PRN
  Filled 2022-12-05: qty 20, 20d supply, fill #0

## 2022-12-05 MED ORDER — PANTOPRAZOLE SODIUM 40 MG PO TBEC: 80.0000 mg | DELAYED_RELEASE_TABLET | Freq: Every day | ORAL | 0 refills | Status: DC

## 2022-12-05 MED ORDER — OLMESARTAN-AMLODIPINE-HCTZ 40-5-25 MG PO TABS
1.0000 | ORAL_TABLET | Freq: Every day | ORAL | 2 refills | Status: DC
Start: 2022-12-05 — End: 2023-02-24

## 2022-12-05 NOTE — Telephone Encounter (Signed)
Call from pt who states last Pantoprazole rx was refilled for 1 tab daily; pt states he takes 1 tab twice a day d/t having acid reflux so bad. Need rx sent to [facility]; along refill on Olmesartan/Amlodpine/HCTZ. Also needs a refill on Viagra - send rx to [facility]. Thanks

## 2022-12-06 DIAGNOSIS — Z419 Encounter for procedure for purposes other than remedying health state, unspecified: Secondary | ICD-10-CM | POA: Diagnosis not present

## 2022-12-27 ENCOUNTER — Telehealth: Payer: Self-pay | Admitting: *Deleted

## 2022-12-27 NOTE — Telephone Encounter (Signed)
Call from pt who stated he's having pain in left leg. Stated the doctor is aware of the poor circulation in his leg. He stated the veins are swollen and now he has a rash. He's not sure if it's from the support stockings but he has been doing what the doctor told him to do. Offered pt an appt today; unable to come, he's currently out of town. First available appt given - 12/11@ [id number]. Advised pt to go to UC/ER if his symptoms worsen - stated he understands.

## 2023-01-05 DIAGNOSIS — Z419 Encounter for procedure for purposes other than remedying health state, unspecified: Secondary | ICD-10-CM | POA: Diagnosis not present

## 2023-01-08 ENCOUNTER — Other Ambulatory Visit: Payer: Self-pay

## 2023-01-08 DIAGNOSIS — J302 Other seasonal allergic rhinitis: Secondary | ICD-10-CM

## 2023-01-08 MED ORDER — FLUTICASONE PROPIONATE 50 MCG/ACT NA SUSP: 2.0000 | Freq: Two times a day (BID) | NASAL | 2 refills | Status: DC

## 2023-01-15 ENCOUNTER — Ambulatory Visit: Payer: Medicaid Other | Admitting: Internal Medicine

## 2023-01-15 ENCOUNTER — Encounter: Payer: Self-pay | Admitting: Internal Medicine

## 2023-01-15 VITALS — BP 133/91 | HR 92 | Temp 98.1°F | Ht 77.0 in | Wt 264.6 lb

## 2023-01-15 DIAGNOSIS — R7303 Prediabetes: Secondary | ICD-10-CM

## 2023-01-15 DIAGNOSIS — B354 Tinea corporis: Secondary | ICD-10-CM | POA: Diagnosis not present

## 2023-01-15 DIAGNOSIS — I10 Essential (primary) hypertension: Secondary | ICD-10-CM

## 2023-01-15 MED ORDER — CLOTRIMAZOLE 1 % EX CREA: 1.0000 | TOPICAL_CREAM | Freq: Two times a day (BID) | CUTANEOUS | 0 refills | Status: AC

## 2023-01-15 NOTE — Progress Notes (Signed)
CC: left leg rash  HPI:  [name] is a 62 y.o. male living with a history stated below and presents today for evaluation of a rash on his left leg. Please see problem based assessment and plan for additional details.  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 30 tablet 11   dolutegravir (TIVICAY) 50 MG tablet Take 1 tablet (50 mg total) by mouth daily. 30 tablet 11   emtricitabine-tenofovir AF (DESCOVY) 200-25 MG tablet Take 1 tablet by mouth daily. 30 tablet 11   fluticasone (FLONASE) 50 MCG/ACT nasal spray Place 2 sprays into both nostrils 2 (two) times daily. 18.2 mL 2   ketoconazole (NIZORAL) 2 % cream Apply 1 Application topically 2 (two) times daily. 60 g 0   methocarbamol (ROBAXIN) 500 MG tablet Take 1 tablet (500 mg total) by mouth 2 (two) times daily. (Patient not taking: Reported on 05/21/2022) 30 tablet 0   Olmesartan-amLODIPine-HCTZ 40-5-25 MG TABS Take 1 tablet by mouth daily. 30 tablet 2   pantoprazole (PROTONIX) 40 MG tablet Take 2 tablets (80 mg total) by mouth daily. 180 tablet 0   sildenafil (VIAGRA) 50 MG tablet Take 1 tablet (50 mg total) by mouth as needed for erectile dysfunction. 20 tablet 1   traMADol (ULTRAM) 50 MG tablet Take 1 tablet (50 mg total) by mouth every 6 (six) hours as needed. (Patient not taking: Reported on 08/22/2022) 15 tablet 0   triamcinolone cream (KENALOG) 0.1 % Apply 1 Application topically 2 (two) times daily. 30 g 0   No current facility-administered medications on file prior to visit.    Family History  Problem Relation Age of Onset   Alcohol  abuse Mother    Hypertension Father    Stroke Father     Social History   Socioeconomic History   Marital status: Single    Spouse name: Not on file   Number of children: Not on file   Years of education: Not on file   Highest education level: Not on file  Occupational History   Not on file  Tobacco Use   Smoking status: Never   Smokeless tobacco: Never  Vaping Use   Vaping status: Never Used  Substance and Sexual Activity   Alcohol use: Not Currently    Alcohol/week: 0.0 standard drinks of alcohol    Comment: none since 2017   Drug use: Not Currently    Types: Marijuana, "Crack" cocaine    Comment:  cocaine and marijuana none since 2017   Sexual activity: Not Currently    Comment: refused  condoms 11/09/2019  Other Topics Concern   Not on file  Social History Narrative   Not on file   Social Determinants of Health   Financial Resource Strain: Not on file  Food Insecurity: No Food Insecurity (03/05/2022)   Hunger Vital Sign    Worried About Running Out of Food in the Last Year: Never true    Ran Out of Food in the Last Year: Never true  Transportation Needs: No Transportation Needs (03/05/2022)  PRAPARE - Administrator, Civil Service (Medical): No    Lack of Transportation (Non-Medical): No  Physical Activity: Not on file  Stress: Not on file  Social Connections: Moderately Isolated (03/05/2022)   Social Connection and Isolation Panel [NHANES]    Frequency of Communication with Friends and Family: More than three times a week    Frequency of Social Gatherings with Friends and Family: More than three times a week    Attends Religious Services: More than 4 times per year    Active Member of [employer] or Organizations: No    Attends Banker Meetings: Never    Marital Status: Never married  Intimate Partner Violence: Not At Risk (03/05/2022)   Humiliation, Afraid, Rape, and Kick questionnaire    Fear of Current or Ex-Partner: No    Emotionally  Abused: No    Physically Abused: No    Sexually Abused: No    Review of Systems: ROS negative except for what is noted on the assessment and plan.  Vitals:   01/15/23 0917  BP: (!) 133/91  Pulse: 92  Temp: 98.1 F (36.7 C)  TempSrc: Oral  SpO2: 98%  Weight: 264 lb 9.6 oz (120 kg)  Height: 6\' 5"  (1.956 m)    Physical Exam: Constitutional: well-appearing male sitting in chair, in no acute distress Pulmonary/Chest: normal work of breathing on room air MSK: normal bulk and tone, 1+ edema in LLE, no edema in RLE.  Neurological: alert & oriented x 3, no focal deficit Skin: well-demarcated circular/oval erythematous scaling plaques/patches on left lower extremity Psych: normal mood and behavior     Assessment & Plan:    Patient discussed with Dr. Sol Blazing  Prediabetes Last A1c 6.0%. Rechecking today, will call patient with results.   Tinea corporis The patient has chronic left leg swelling that he has been told is 2/2 to a superficial venous thrombosis he had years ago and also because he has varicose veins. His swelling is unchanged/at baseline today, but he has now developed a rash on his left leg that has been present for almost 1 month. It is pruritic in nature and the patient has applied topical hydrocortisone to help relieve his itching. He denies any pain.   On exam, the patient's left leg has well-demarcated, circular plaques/patches that are scaly in appearance.  Plan: - Clotrimazole 1% cream BID - Follow up in ~4 weeks to re-assess  Hypertension Compliant with olmesartan-amlodipine-hydrochlorothiazide 40-5-25 mg daily. BMP today.    Elza Rafter, D.O. [facility], PGY-3 Phone: [phone] Date 01/15/2023 Time 10:57 AM

## 2023-01-15 NOTE — Assessment & Plan Note (Signed)
Compliant with olmesartan-amlodipine-hydrochlorothiazide 40-5-25 mg daily. BMP today.

## 2023-01-15 NOTE — Assessment & Plan Note (Signed)
Last A1c 6.0%. Rechecking today, will call patient with results.

## 2023-01-15 NOTE — Assessment & Plan Note (Addendum)
The patient has chronic left leg swelling that he has been told is 2/2 to a superficial venous thrombosis he had years ago and also because he has varicose veins. His swelling is unchanged/at baseline today, but he has now developed a rash on his left leg that has been present for almost 1 month. It is pruritic in nature and the patient has applied topical hydrocortisone to help relieve his itching. He denies any pain.   On exam, the patient's left leg has well-demarcated, circular plaques/patches that are scaly in appearance.  Plan: - Clotrimazole 1% cream BID - Follow up in ~4 weeks to re-assess

## 2023-01-15 NOTE — Patient Instructions (Signed)
Thank you, Mr.Mazin NENO ASKIN for allowing [place] to provide your care today. Today we discussed:  Leg rash I suspect your rash is caused by a fungal infection Use clotrimazole cream twice a day Come back in ~ 4 weeks so we can follow up to make sure it is doing better We are checking your labs for prediabetes today and your kidney function - I will call you with the results  I have ordered the following labs for you:   Lab Orders         BMP8+Anion Gap         Hemoglobin A1c       Referrals ordered today:   Referral Orders  No referral(s) requested today     I have ordered the following medication/changed the following medications:   Stop the following medications: There are no discontinued medications.   Start the following medications: Meds ordered this encounter  Medications   clotrimazole (LOTRIMIN) 1 % cream    Sig: Apply 1 Application topically 2 (two) times daily.    Dispense:  30 g    Refill:  0     Follow up:  4 weeks     Should you have any questions or concerns please call the internal medicine clinic at [phone].     Elza Rafter, D.O. [facility]

## 2023-01-17 LAB — BMP8+ANION GAP
Anion Gap: 15 mmol/L (ref 10.0–18.0)
BUN/Creatinine Ratio: 11 (ref 10–24)
BUN: 15 mg/dL (ref 8–27)
CO2: 22 mmol/L (ref 20–29)
Calcium: 9.3 mg/dL (ref 8.6–10.2)
Chloride: 102 mmol/L (ref 96–106)
Creatinine, Ser: 1.34 mg/dL — ABNORMAL HIGH (ref 0.76–1.27)
Glucose: 109 mg/dL — ABNORMAL HIGH (ref 70–99)
Potassium: 4.3 mmol/L (ref 3.5–5.2)
Sodium: 139 mmol/L (ref 134–144)
eGFR: 59 mL/min/{1.73_m2} — ABNORMAL LOW (ref >59)

## 2023-01-17 LAB — HEMOGLOBIN A1C
Est. average glucose Bld gHb Est-mCnc: 137 mg/dL
Hgb A1c MFr Bld: 6.4 % — ABNORMAL HIGH (ref 4.8–5.6)

## 2023-01-17 NOTE — Progress Notes (Signed)
Cr stable compared to 8 months ago. A1c slightly higher at 6.4% - patient is going to work on lifestyle modifications in hopes of achieving good glycemic control without medication.

## 2023-01-20 NOTE — Progress Notes (Signed)
Internal Medicine Clinic Attending ° °Case discussed with Dr. Atway  At the time of the visit.  We reviewed the resident’s history and exam and pertinent patient test results.  I agree with the assessment, diagnosis, and plan of care documented in the resident’s note.  °

## 2023-01-31 ENCOUNTER — Telehealth: Payer: Self-pay | Admitting: *Deleted

## 2023-01-31 NOTE — Telephone Encounter (Signed)
Call from pt who stated he was seen a couple weeks ago (12/11 with Dr Ned Card) for a rash on his leg and was prescribed a cream. Stated he used it for about a week and stopped b/c it made the rash worse; stated the rash is very red. He wants to know if something else can be prescribed or does he needs an appt?

## 2023-01-31 NOTE — Telephone Encounter (Signed)
Pt was called and informed to schedule an appt to be evaluated. Call transferred to the front office - appt schedule w/Dr Annie Paras on 02/10/23.

## 2023-02-05 DIAGNOSIS — Z419 Encounter for procedure for purposes other than remedying health state, unspecified: Secondary | ICD-10-CM | POA: Diagnosis not present

## 2023-02-10 ENCOUNTER — Encounter: Payer: Medicaid Other | Admitting: Student

## 2023-02-18 ENCOUNTER — Telehealth: Payer: Self-pay | Admitting: Student

## 2023-02-18 NOTE — Telephone Encounter (Signed)
 Pt states he is in [place]  visiting and his blood pressure is really high. Pt states he checked it and it 133/103.  Pt states his sister picked up his medication from the following pharmacy and mailed it to him but, he has not rec'd it and it has now been 7 days and he still has not rec'd it.  Olmesartan -amLODIPine -HCTZ 40-5-25 MG TABS    The location in [place]  stated that his Medicaid would not pay for it.  The pt was able to pay for 3 days worth and now needs to know what to do.  This [facility] location in [place]  is listed below where he needs to get it sent .    [facility]  Address: [address] [place], [place] [address] Phone: [phone]

## 2023-02-24 ENCOUNTER — Telehealth: Payer: Self-pay | Admitting: Internal Medicine

## 2023-02-24 DIAGNOSIS — I10 Essential (primary) hypertension: Secondary | ICD-10-CM

## 2023-02-24 MED ORDER — OLMESARTAN-AMLODIPINE-HCTZ 40-5-25 MG PO TABS: 1.0000 | ORAL_TABLET | Freq: Every day | ORAL | 2 refills | Status: DC

## 2023-02-24 NOTE — Telephone Encounter (Signed)
Received page from operator requesting return phone call to patient.   Patient explains that on 01/06 he ran out of his blood pressure medications. He is currently in [place] and was told that if he went [facility] there, he could get a refill. He ran into some trouble though as [place] can't take his [place] medicaid, so he purchased 3 pills at that time. He had his sister pick up a refill here in [place] and ship it to him, however the medication never made it to him.  I see that he called the clinic about this on 01/14 but I do not see if it was addressed.   He has been checking his BP and it has been creeping up, most recently 135/04. He is requesting a new refill be sent to his local pharmacy so that he can go try and pick it up in [place] again while he waits for his prescription that was mailed nearly 2 weeks ago now to make it to him. He has a family member who is willing to pay for the prescription for him.  I will send a refill in for him at this time.  Champ Mungo, DO

## 2023-02-25 NOTE — Telephone Encounter (Signed)
Pt states he was never called but, states he was able to call the on call Dr. Last night and was able to pick up his medication in [place].

## 2023-03-01 IMAGING — CR DG HAND COMPLETE 3+V*L*
3 series · 3 of 3 positions shown · non-contrast
Comparison: None.

CLINICAL DATA: Left thumb pain. Pain at the CMC joint. Patient
reports left thumb pain for 1 month, worse this week.

EXAM:
LEFT HAND - COMPLETE 3+ VIEW

[hand pa]
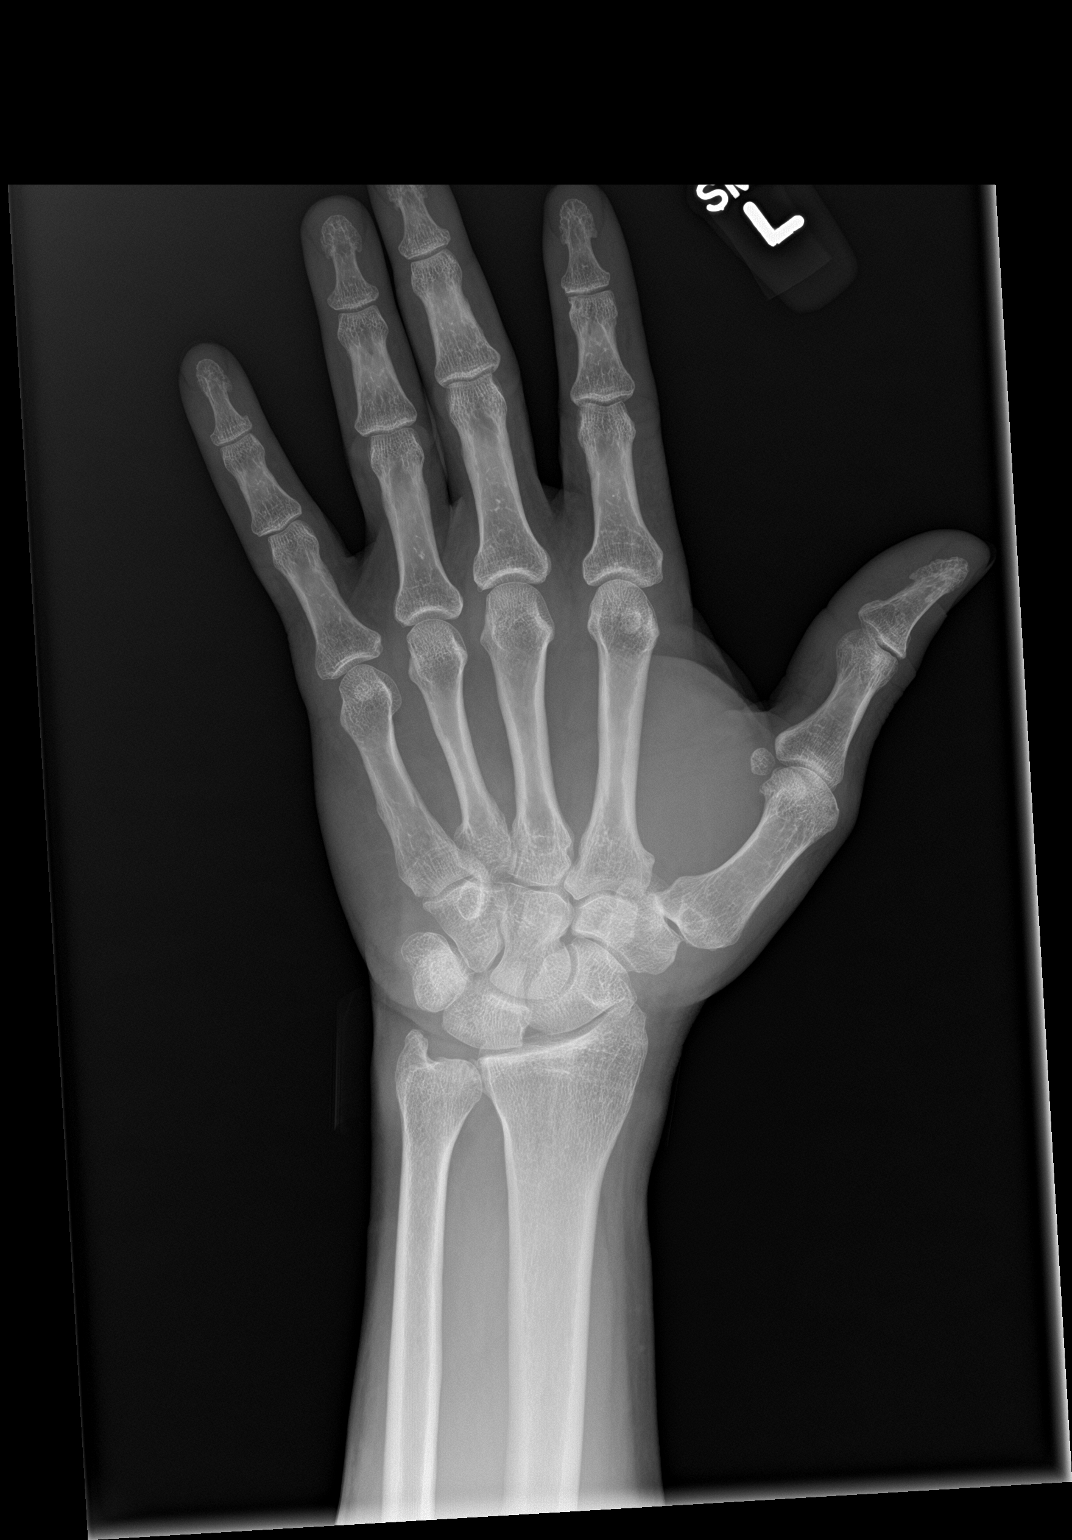

[hand obl]
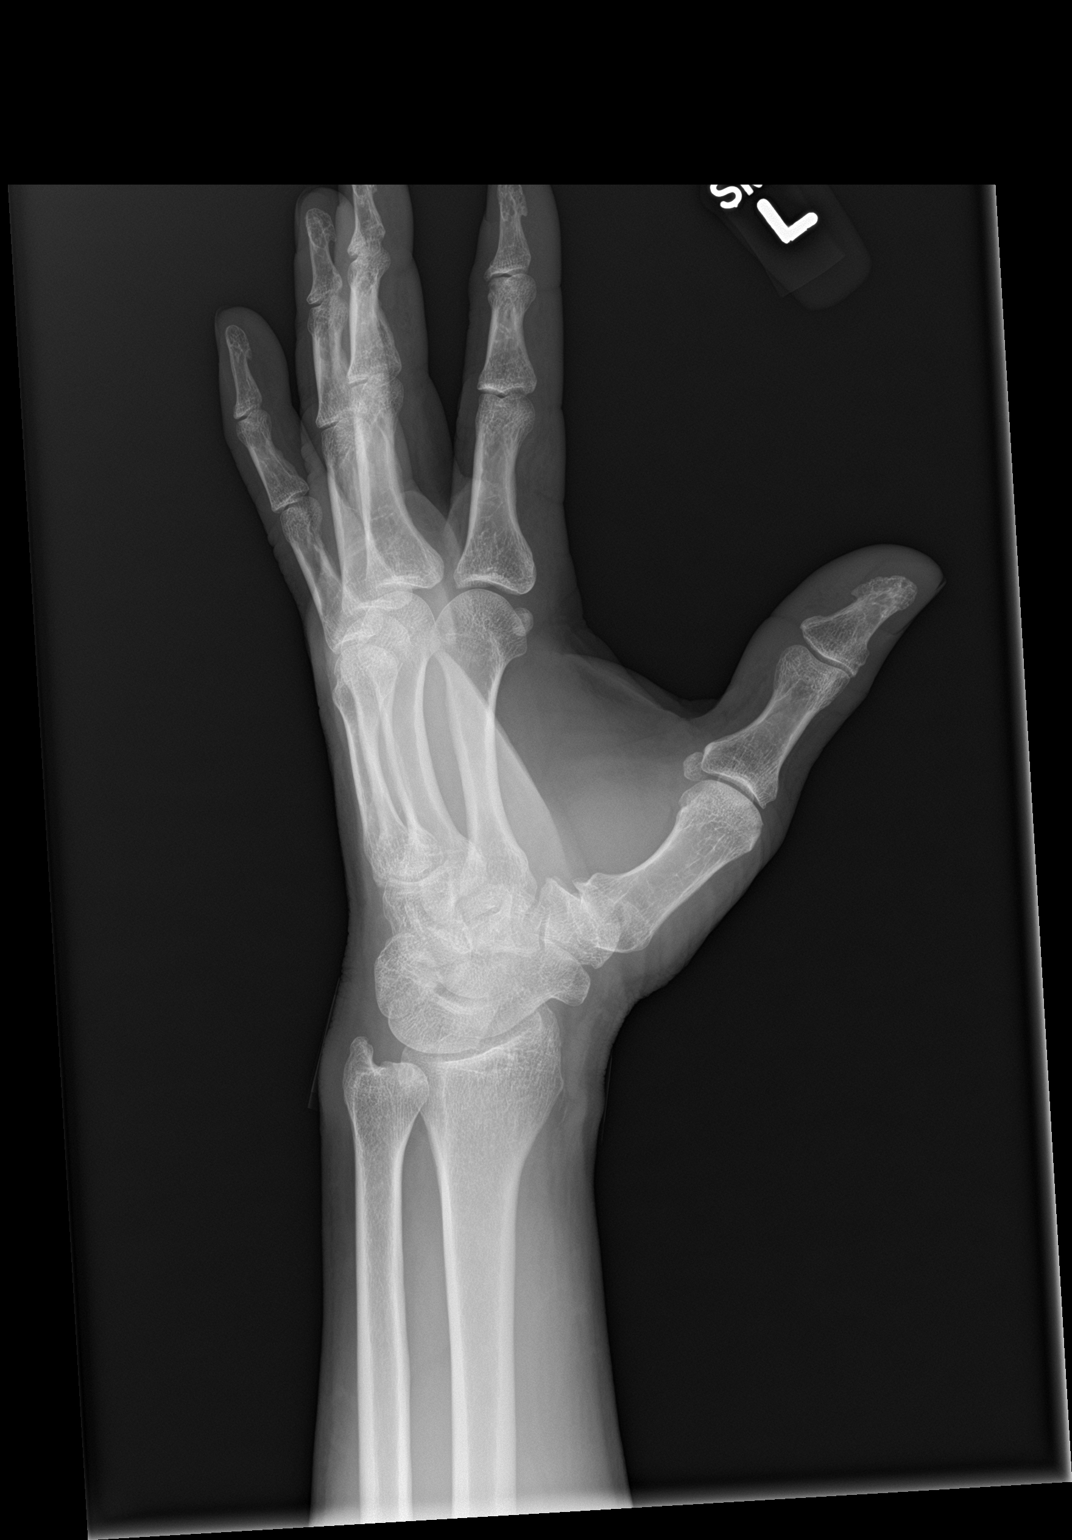

[hand lat]
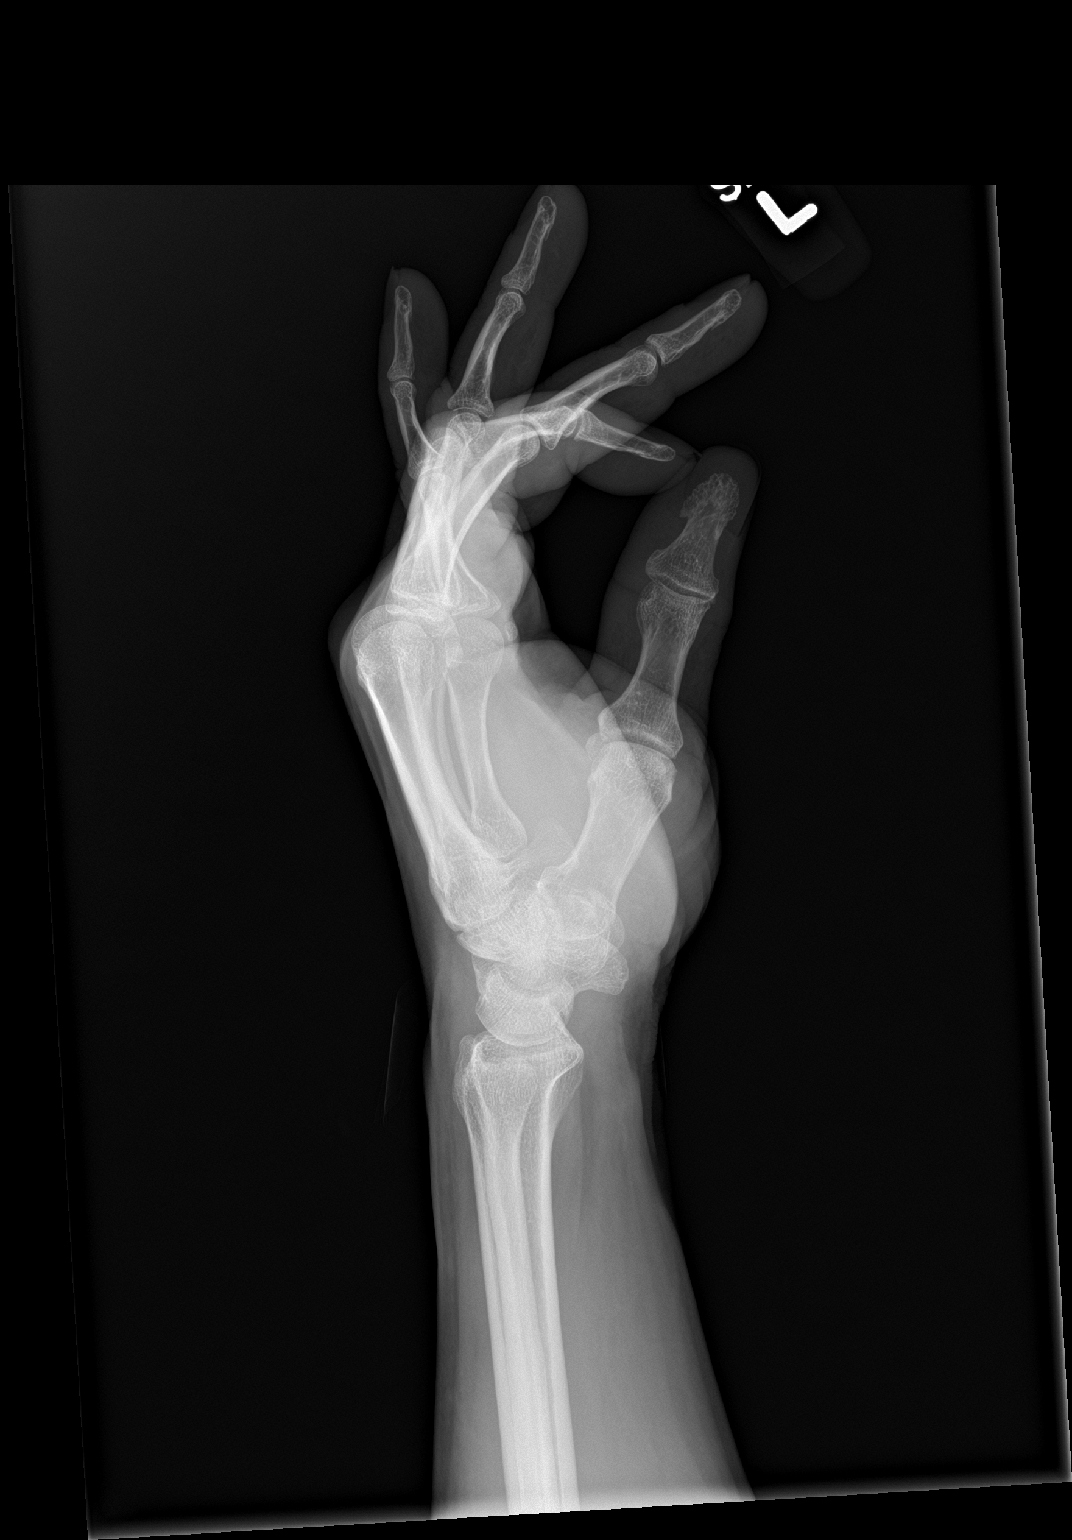

[3 of 3 positions shown; findings below may reference images not displayed]

FINDINGS: Osteoarthritis of the thumb carpal metacarpal joint with joint space
narrowing, osteophytes, subchondral cystic change and slight radial
subluxation. Additional osteoarthritis involving the metacarpal
phalangeal joint and interphalangeal joint of the thumb, as well as
the distal interphalangeal joints of the index finger. No erosive
change or periostitis. There is also mild radiocarpal joint space
narrowing and spurring. No fracture. Unremarkable soft tissues. No
soft tissue calcifications.
IMPRESSION: Osteoarthritis of the thumb, most prominently affecting the carpal
metacarpal joint. No evidence of inflammatory arthropathy.

## 2023-03-06 ENCOUNTER — Other Ambulatory Visit (HOSPITAL_COMMUNITY): Payer: Self-pay

## 2023-03-06 ENCOUNTER — Telehealth: Payer: Self-pay | Admitting: Student

## 2023-03-06 MED ORDER — SILDENAFIL CITRATE 50 MG PO TABS
50.0000 mg | ORAL_TABLET | ORAL | 3 refills | Status: DC | PRN
  Filled 2023-03-06: qty 20, 20d supply, fill #0
  Filled 2023-05-12: qty 20, 20d supply, fill #1
  Filled 2023-07-02: qty 20, 20d supply, fill #2
  Filled 2023-08-28: qty 20, 20d supply, fill #3

## 2023-03-06 NOTE — Telephone Encounter (Signed)
  sildenafil (VIAGRA) 50 MG tablet  [facility] AT [place]

## 2023-03-06 NOTE — Telephone Encounter (Signed)
Medication sent to pharmacy

## 2023-03-07 ENCOUNTER — Other Ambulatory Visit: Payer: Self-pay

## 2023-03-07 ENCOUNTER — Telehealth: Payer: Self-pay

## 2023-03-07 DIAGNOSIS — B20 Human immunodeficiency virus [HIV] disease: Secondary | ICD-10-CM

## 2023-03-07 DIAGNOSIS — Z113 Encounter for screening for infections with a predominantly sexual mode of transmission: Secondary | ICD-10-CM

## 2023-03-07 NOTE — Telephone Encounter (Signed)
Patient called to schedule lab and his yearly f/u with Tammy Sours. He did request, if possible, that he could add to have his A1c and cholesterol checked as well.   Verified contact of [phone]

## 2023-03-08 DIAGNOSIS — Z419 Encounter for procedure for purposes other than remedying health state, unspecified: Secondary | ICD-10-CM | POA: Diagnosis not present

## 2023-03-12 ENCOUNTER — Other Ambulatory Visit: Payer: Medicaid Other

## 2023-03-12 ENCOUNTER — Other Ambulatory Visit: Payer: Self-pay

## 2023-03-12 ENCOUNTER — Ambulatory Visit: Payer: Medicaid Other | Admitting: Student

## 2023-03-12 ENCOUNTER — Encounter: Payer: Self-pay | Admitting: Student

## 2023-03-12 VITALS — BP 122/75 | HR 72 | Temp 98.1°F | Ht 77.0 in | Wt 263.0 lb

## 2023-03-12 DIAGNOSIS — K5904 Chronic idiopathic constipation: Secondary | ICD-10-CM | POA: Diagnosis not present

## 2023-03-12 DIAGNOSIS — Z113 Encounter for screening for infections with a predominantly sexual mode of transmission: Secondary | ICD-10-CM | POA: Diagnosis not present

## 2023-03-12 DIAGNOSIS — K219 Gastro-esophageal reflux disease without esophagitis: Secondary | ICD-10-CM | POA: Diagnosis not present

## 2023-03-12 DIAGNOSIS — Z79899 Other long term (current) drug therapy: Secondary | ICD-10-CM

## 2023-03-12 DIAGNOSIS — B20 Human immunodeficiency virus [HIV] disease: Secondary | ICD-10-CM

## 2023-03-12 DIAGNOSIS — I8392 Asymptomatic varicose veins of left lower extremity: Secondary | ICD-10-CM

## 2023-03-12 DIAGNOSIS — E785 Hyperlipidemia, unspecified: Secondary | ICD-10-CM | POA: Diagnosis not present

## 2023-03-12 DIAGNOSIS — I872 Venous insufficiency (chronic) (peripheral): Secondary | ICD-10-CM

## 2023-03-12 DIAGNOSIS — B351 Tinea unguium: Secondary | ICD-10-CM

## 2023-03-12 MED ORDER — POLYETHYLENE GLYCOL 3350 17 G PO PACK: 17.0000 g | PACK | Freq: Every day | ORAL | 0 refills | Status: AC

## 2023-03-12 MED ORDER — PANTOPRAZOLE SODIUM 40 MG PO TBEC: 40.0000 mg | DELAYED_RELEASE_TABLET | Freq: Two times a day (BID) | ORAL | 2 refills | Status: DC

## 2023-03-12 NOTE — Assessment & Plan Note (Signed)
 Ongoing GERD, relief with omeprazole .  No dysphagia, unexpected weight loss, early satiety, nausea, or vomiting.

## 2023-03-12 NOTE — Patient Instructions (Signed)
 Thank you, [name] Chad Little for allowing us  to provide your care today.   Referrals ordered today:    Referral Orders         Ambulatory referral to Vascular Surgery      I have ordered the following medication/changed the following medications:   Stop the following medications: Medications Discontinued During This Encounter  Medication Reason   pantoprazole  (PROTONIX ) 40 MG tablet Reorder     Start the following medications: Meds ordered this encounter  Medications   pantoprazole  (PROTONIX ) 40 MG tablet    Sig: Take 1 tablet (40 mg total) by mouth 2 (two) times daily.    Dispense:  180 tablet    Refill:  2   polyethylene glycol (MIRALAX ) 17 g packet    Sig: Take 17 g by mouth daily.    Dispense:  14 each    Refill:  0      Follow up: 2 months for follow up on chronic conditions    We look forward to seeing you next time. Please call our clinic at [phone] if you have any questions or concerns. The best time to call is Monday-Friday from 9am-4pm, but there is someone available 24/7. If after hours or the weekend, call the main hospital number and ask for the Internal Medicine Resident On-Call. If you need medication refills, please notify your pharmacy one week in advance and they will send us  a request.   Thank you for trusting me with your care. Wishing you the best!  Trilby Europe MD [facility]

## 2023-03-12 NOTE — Progress Notes (Signed)
 Subjective:  CC: Lower extremity swelling, rash.  HPI:  [name] is a 63 y.o. person with a past medical history stated below and presents today for the stated chief complaint. Please see problem based assessment and plan for additional details.  Past Medical History:  Diagnosis Date   Arthritis    Enlarged prostate    Essential hypertension    GERD (gastroesophageal reflux disease)    Gout    diet controlled no recent flare ups   HIV infection (HCC)    Superficial vein thrombosis 08/08/2020    Current Outpatient Medications on File Prior to Visit  Medication Sig Dispense Refill   atorvastatin  (LIPITOR) 10 MG tablet Take 1 tablet (10 mg total) by mouth daily. 30 tablet 11   clotrimazole  (LOTRIMIN ) 1 % cream Apply 1 Application topically 2 (two) times daily. 30 g 0   dolutegravir  (TIVICAY ) 50 MG tablet Take 1 tablet (50 mg total) by mouth daily. 30 tablet 11   emtricitabine -tenofovir  AF (DESCOVY ) 200-25 MG tablet Take 1 tablet by mouth daily. 30 tablet 11   fluticasone  (FLONASE ) 50 MCG/ACT nasal spray Place 2 sprays into both nostrils 2 (two) times daily. 18.2 mL 2   ketoconazole  (NIZORAL ) 2 % cream Apply 1 Application topically 2 (two) times daily. 60 g 0   methocarbamol  (ROBAXIN ) 500 MG tablet Take 1 tablet (500 mg total) by mouth 2 (two) times daily. (Patient not taking: Reported on 05/21/2022) 30 tablet 0   Olmesartan -amLODIPine -HCTZ 40-5-25 MG TABS Take 1 tablet by mouth daily. 30 tablet 2   sildenafil  (VIAGRA ) 50 MG tablet Take 1 tablet (50 mg total) by mouth as needed for erectile dysfunction. 20 tablet 3   traMADol  (ULTRAM ) 50 MG tablet Take 1 tablet (50 mg total) by mouth every 6 (six) hours as needed. (Patient not taking: Reported on 08/22/2022) 15 tablet 0   triamcinolone  cream (KENALOG ) 0.1 % Apply 1 Application topically 2 (two) times daily. 30 g 0   No current facility-administered medications on file prior to visit.    Review of Systems: Please  see assessment and plan for pertinent positives and negatives.  Objective:   Vitals:   03/12/23 1317  BP: 122/75  Pulse: 72  Temp: 98.1 F (36.7 C)  TempSrc: Oral  SpO2: 97%  Weight: 263 lb (119.3 kg)  Height: 6' 5 (1.956 m)    Physical Exam: Constitutional: Well-appearing Cardiovascular: Regular rate and rhythm Pulmonary/Chest: lungs clear to auscultation bilaterally Abdominal: soft, non-tender, non-distended Extremities: Hyperpigmentation of the LLE with hypopigmented areas with some early ulceration.  Tenderness to palpation of these areas.  1+ edema to the midshin of the left lower extremity.  Right lower extremity without edema. Psych: Very pleasant affect Thought process is linear and is goal-directed.     Assessment & Plan:  Chronic idiopathic constipation Well-managed with MiraLAX  daily, currently having regular bowel movements.  Colonoscopy within the last year, 1 polyp removed.  Per patient was told to come back in 2 years for repeat colonoscopy-unable to access pathology.  No melena, hematochezia, or change in stool caliber. Plan: Continue MiraLAX    Venous stasis dermatitis of left lower extremity Left lower extremity rash.  At last visit there was some concern for tinea corporis in this patient with prior fungal infections. Today, patient states they have not had improved with topical antifungals. On exam they have some hyperpigmentation of the LLE with hypopigment plaques and some evidence of ulceration. They endorses pain of the LE over the  ulcerated regions. 1+ pitting edema to mid-shin bilaterally. No edema of the RLE  They do have a known venous insufficiency of the LLE and varicose veins.  They have a history of superficial vein thrombosis but no known history of DVT to suggest post thrombotic syndrome.  Plan: Vascular consult for possible intervention. Discussed measures to try to reduce lower extremity edema such as compression stockings and  elevation.   GERD (gastroesophageal reflux disease) Ongoing GERD, relief with omeprazole .  No dysphagia, unexpected weight loss, early satiety, nausea, or vomiting.   Patient seen with Dr. Jerrell Trilby Europe MD [facility]  PGY-1 Pager: [phone]  Phone: [phone] Date 03/12/2023  Time 11:04 PM

## 2023-03-12 NOTE — Assessment & Plan Note (Addendum)
 Left lower extremity rash.  At last visit there was some concern for tinea corporis in this patient with prior fungal infections. Today, patient states they have not had improved with topical antifungals. On exam they have some hyperpigmentation of the LLE with hypopigment plaques and some evidence of ulceration. They endorses pain of the LE over the ulcerated regions. 1+ pitting edema to mid-shin bilaterally. No edema of the RLE  They do have a known venous insufficiency of the LLE and varicose veins.  They have a history of superficial vein thrombosis but no known history of DVT to suggest post thrombotic syndrome.  Plan: Vascular consult for possible intervention. Discussed measures to try to reduce lower extremity edema such as compression stockings and elevation.

## 2023-03-12 NOTE — Assessment & Plan Note (Signed)
 Well-managed with MiraLAX  daily, currently having regular bowel movements.  Colonoscopy within the last year, 1 polyp removed.  Per patient was told to come back in 2 years for repeat colonoscopy-unable to access pathology.  No melena, hematochezia, or change in stool caliber. Plan: Continue MiraLAX 

## 2023-03-13 LAB — T-HELPER CELL (CD4) - (RCID CLINIC ONLY)
CD4 % Helper T Cell: 35 % (ref 33–65)
CD4 T Cell Abs: 727 /uL (ref 400–1790)

## 2023-03-13 NOTE — Progress Notes (Signed)
 Internal Medicine Clinic Attending  I was physically present during the key portions of the resident provided service and participated in the medical decision making of patient's management care. I reviewed pertinent patient test results.  The assessment, diagnosis, and plan were formulated together and I agree with the documentation in the resident's note.  Erlinda Hong, MD FACP

## 2023-03-14 LAB — COMPLETE METABOLIC PANEL WITH GFR
AG Ratio: 1.5 (calc) (ref 1.0–2.5)
ALT: 28 U/L (ref 9–46)
AST: 30 U/L (ref 10–35)
Albumin: 4.6 g/dL (ref 3.6–5.1)
Alkaline phosphatase (APISO): 87 U/L (ref 35–144)
BUN/Creatinine Ratio: 11 (calc) (ref 6–22)
BUN: 17 mg/dL (ref 7–25)
CO2: 27 mmol/L (ref 20–32)
Calcium: 10 mg/dL (ref 8.6–10.3)
Chloride: 103 mmol/L (ref 98–110)
Creat: 1.55 mg/dL — ABNORMAL HIGH (ref 0.70–1.35)
Globulin: 3.1 g/dL (ref 1.9–3.7)
Glucose, Bld: 101 mg/dL — ABNORMAL HIGH (ref 65–99)
Potassium: 4.7 mmol/L (ref 3.5–5.3)
Sodium: 139 mmol/L (ref 135–146)
Total Bilirubin: 0.6 mg/dL (ref 0.2–1.2)
Total Protein: 7.7 g/dL (ref 6.1–8.1)
eGFR: 50 mL/min/{1.73_m2} — ABNORMAL LOW (ref >=60)

## 2023-03-14 LAB — CBC WITH DIFFERENTIAL/PLATELET
Absolute Lymphocytes: 1996 {cells}/uL (ref 850–3900)
Absolute Monocytes: 852 {cells}/uL (ref 200–950)
Basophils Absolute: 72 {cells}/uL (ref 0–200)
Basophils Relative: 1.1 %
Eosinophils Absolute: 241 {cells}/uL (ref 15–500)
Eosinophils Relative: 3.7 %
HCT: 48.5 % (ref 38.5–50.0)
Hemoglobin: 16.3 g/dL (ref 13.2–17.1)
MCH: 32 pg (ref 27.0–33.0)
MCHC: 33.6 g/dL (ref 32.0–36.0)
MCV: 95.3 fL (ref 80.0–100.0)
MPV: 10.1 fL (ref 7.5–12.5)
Monocytes Relative: 13.1 %
Neutro Abs: 3341 {cells}/uL (ref 1500–7800)
Neutrophils Relative %: 51.4 %
Platelets: 297 10*3/uL (ref 140–400)
RBC: 5.09 10*6/uL (ref 4.20–5.80)
RDW: 12.6 % (ref 11.0–15.0)
Total Lymphocyte: 30.7 %
WBC: 6.5 10*3/uL (ref 3.8–10.8)

## 2023-03-14 LAB — RPR: RPR Ser Ql: NONREACTIVE

## 2023-03-14 LAB — LIPID PANEL
Cholesterol: 112 mg/dL (ref <200)
HDL: 55 mg/dL (ref >=40)
LDL Cholesterol (Calc): 44 mg/dL
Non-HDL Cholesterol (Calc): 57 mg/dL (ref <130)
Total CHOL/HDL Ratio: 2 (calc) (ref <5.0)
Triglycerides: 45 mg/dL (ref <150)

## 2023-03-14 LAB — HIV-1 RNA QUANT-NO REFLEX-BLD
HIV 1 RNA Quant: 73 {copies}/mL — ABNORMAL HIGH
HIV-1 RNA Quant, Log: 1.86 {Log} — ABNORMAL HIGH

## 2023-03-25 ENCOUNTER — Ambulatory Visit: Payer: Medicaid Other | Admitting: Family

## 2023-03-25 ENCOUNTER — Other Ambulatory Visit: Payer: Self-pay

## 2023-03-25 ENCOUNTER — Encounter: Payer: Self-pay | Admitting: Family

## 2023-03-25 VITALS — BP 137/86 | HR 93 | Temp 97.7°F | Resp 16 | Wt 263.0 lb

## 2023-03-25 DIAGNOSIS — B20 Human immunodeficiency virus [HIV] disease: Secondary | ICD-10-CM | POA: Diagnosis not present

## 2023-03-25 DIAGNOSIS — I10 Essential (primary) hypertension: Secondary | ICD-10-CM

## 2023-03-25 DIAGNOSIS — N1831 Chronic kidney disease, stage 3a: Secondary | ICD-10-CM

## 2023-03-25 DIAGNOSIS — I129 Hypertensive chronic kidney disease with stage 1 through stage 4 chronic kidney disease, or unspecified chronic kidney disease: Secondary | ICD-10-CM | POA: Diagnosis not present

## 2023-03-25 DIAGNOSIS — I872 Venous insufficiency (chronic) (peripheral): Secondary | ICD-10-CM

## 2023-03-25 DIAGNOSIS — Z0000 Encounter for general adult medical examination without abnormal findings: Secondary | ICD-10-CM

## 2023-03-25 MED ORDER — DOVATO 50-300 MG PO TABS: 1.0000 | ORAL_TABLET | Freq: Every day | ORAL | 5 refills | Status: DC

## 2023-03-25 NOTE — Patient Instructions (Addendum)
Nice to see you.  Continue to take your medication daily as prescribed.  Refills have been sent to the pharmacy.  Check into [facility] or [facility] for glucose monitoring.  Plan for follow up in 9 months or sooner if needed with lab work on the same day.  Have a great day and stay safe!

## 2023-03-25 NOTE — Progress Notes (Unsigned)
Brief Narrative   Patient ID: Chad Little, male    DOB: [birth year], 63 y.o.   MRN: [id number]  Chad Little is a 63 year old African-American male diagnosed with HIV-1 in 1998 with risk factor heterosexual contact.  Initial viral load and CD4 count unavailable.  Genotype from 2009 with no significant medication resistance patterns.  No history of opportunistic infection. [id number] negative. ART regimen of Tivicay and Descovy.    Subjective:    Chief Complaint  Patient presents with   Follow-up    B20 c/o rash on face - patient concerned if it is due to ART     HPI:  Chad Little is a 63 y.o. male with HIV disease last seen on 05/21/2022 with well-controlled virus and good adherence and tolerance to Tivicay and Descovy.  Viral load was undetectable with CD4 count 702.  Kidney function, liver function, electrolytes within normal ranges.  Most recent lab work completed on 03/12/2023 with viral load that remains undetectable and CD4 count of 727.  Renal function with creatinine of 1.55 and estimated GFR of 50 consistent with CKD stage III.  Liver function and electrolytes within normal ranges.  Lipid profile with triglycerides 45, LDL 44, and HDL 55.  Here today for routine follow-up.  Chad Little has been doing okay since his last office visit and continues to take Tivicay and Descovy as prescribed with no adverse side effects or problems obtaining medication from the pharmacy.  Has concern about rash on his leg and face with potential source being medication related.  Lower extremity rash confined to the left distal lower leg with no recent spread and nothing on his right side.  Does have significant swelling compared to the contralateral side and has been diagnosed with venous stasis of the left lower extremity previously.  Facial rash comes and goes.  No other changes to body care or laundering products.  Wants to optimize health is much as possible.  Condoms and site-specific STD  testing offered.  Healthcare maintenance reviewed. Working full time and housing and access to food are stable. Transportation is via [employer].   Denies fevers, chills, night sweats, headaches, changes in vision, neck pain/stiffness, nausea, diarrhea, vomiting, lesions or rashes.  Lab Results  Component Value Date   CD4TCELL 35 03/12/2023   CD4TABS 727 03/12/2023   Lab Results  Component Value Date   HIV1RNAQUANT 73 (H) 03/12/2023     No Known Allergies    Outpatient Medications Prior to Visit  Medication Sig Dispense Refill   atorvastatin (LIPITOR) 10 MG tablet Take 1 tablet (10 mg total) by mouth daily. 30 tablet 11   clotrimazole (LOTRIMIN) 1 % cream Apply 1 Application topically 2 (two) times daily. 30 g 0   fluticasone (FLONASE) 50 MCG/ACT nasal spray Place 2 sprays into both nostrils 2 (two) times daily. 18.2 mL 2   ketoconazole (NIZORAL) 2 % cream Apply 1 Application topically 2 (two) times daily. 60 g 0   Olmesartan-amLODIPine-HCTZ 40-5-25 MG TABS Take 1 tablet by mouth daily. 30 tablet 2   pantoprazole (PROTONIX) 40 MG tablet Take 1 tablet (40 mg total) by mouth 2 (two) times daily. 180 tablet 2   polyethylene glycol (MIRALAX) 17 g packet Take 17 g by mouth daily. 14 each 0   sildenafil (VIAGRA) 50 MG tablet Take 1 tablet (50 mg total) by mouth as needed for erectile dysfunction. 20 tablet 3   triamcinolone cream (KENALOG) 0.1 % Apply 1 Application topically 2 (two)  times daily. 30 g 0   dolutegravir (TIVICAY) 50 MG tablet Take 1 tablet (50 mg total) by mouth daily. 30 tablet 11   emtricitabine-tenofovir AF (DESCOVY) 200-25 MG tablet Take 1 tablet by mouth daily. 30 tablet 11   methocarbamol (ROBAXIN) 500 MG tablet Take 1 tablet (500 mg total) by mouth 2 (two) times daily. (Patient not taking: Reported on 03/25/2023) 30 tablet 0   traMADol (ULTRAM) 50 MG tablet Take 1 tablet (50 mg total) by mouth every 6 (six) hours as needed. (Patient not taking: Reported on  03/25/2023) 15 tablet 0   No facility-administered medications prior to visit.     Past Medical History:  Diagnosis Date   Arthritis    Enlarged prostate    Essential hypertension    GERD (gastroesophageal reflux disease)    Gout    diet controlled no recent flare ups   HIV infection (HCC)    Superficial vein thrombosis 08/08/2020     Past Surgical History:  Procedure Laterality Date   APPENDECTOMY  2005   open   Right elbow surgery  dec3 2020   in [place]   TRANSURETHRAL RESECTION OF PROSTATE N/A 02/14/2020   Procedure: TRANSURETHRAL RESECTION OF THE PROSTATE (TURP), BIPOLAR;  Surgeon: Jannifer Hick, MD;  Location: [facility];  Service: Urology;  Laterality: N/A;      Review of Systems  Constitutional:  Negative for appetite change, chills, fatigue, fever and unexpected weight change.  Eyes:  Negative for visual disturbance.  Respiratory:  Negative for cough, chest tightness, shortness of breath and wheezing.   Cardiovascular:  Positive for leg swelling (Left distal lower leg). Negative for chest pain.  Gastrointestinal:  Negative for abdominal pain, constipation, diarrhea, nausea and vomiting.  Genitourinary:  Negative for dysuria, flank pain, frequency, genital sores, hematuria and urgency.  Skin:  Negative for rash.  Allergic/Immunologic: Negative for immunocompromised state.  Neurological:  Negative for dizziness and headaches.      Objective:    BP 137/86   Pulse 93   Temp 97.7 F (36.5 C) (Oral)   Resp 16   Wt 263 lb (119.3 kg)   SpO2 97%   BMI 31.19 kg/m  Nursing note and vital signs reviewed.  Physical Exam Constitutional:      General: He is not in acute distress.    Appearance: He is well-developed.  Eyes:     Conjunctiva/sclera: Conjunctivae normal.  Cardiovascular:     Rate and Rhythm: Normal rate and regular rhythm.     Heart sounds: Normal heart sounds. No murmur heard.    No friction rub. No gallop.  Pulmonary:      Effort: Pulmonary effort is normal. No respiratory distress.     Breath sounds: Normal breath sounds. No wheezing or rales.  Chest:     Chest wall: No tenderness.  Abdominal:     General: Bowel sounds are normal.     Palpations: Abdomen is soft.     Tenderness: There is no abdominal tenderness.  Musculoskeletal:     Cervical back: Neck supple.  Lymphadenopathy:     Cervical: No cervical adenopathy.  Skin:    General: Skin is warm and dry.     Findings: No rash.     Comments: Left lower leg with edema compared to contralateral side with skin changes consistent with venous stasis. Mild calf tenderness  Neurological:     Mental Status: He is alert and oriented to person, place, and time.  Psychiatric:  Mood and Affect: Mood normal.         03/25/2023    9:42 AM 03/12/2023    1:21 PM 01/15/2023    9:16 AM 07/02/2022   10:59 AM 05/21/2022    9:10 AM  Depression screen PHQ 2/9  Decreased Interest 0 0 0 0 0  Down, Depressed, Hopeless 0 0 0 0 0  PHQ - 2 Score 0 0 0 0 0  Altered sleeping    0   Tired, decreased energy    1   Change in appetite    0   Feeling bad or failure about yourself     0   Trouble concentrating    0   Moving slowly or fidgety/restless    0   Suicidal thoughts    0   PHQ-9 Score    1   Difficult doing work/chores    Not difficult at all        Assessment & Plan:    Patient Active Problem List   Diagnosis Date Noted   Chronic kidney disease, stage 3a (HCC) 03/26/2023   Venous stasis dermatitis of left lower extremity 03/12/2023   Chronic idiopathic constipation 03/12/2023   Bacterial sinusitis 11/07/2022   Tinea corporis 08/22/2022   Rash 07/02/2022   Seborrheic dermatitis 12/21/2021   Blurry vision, left eye 09/20/2021   Erectile dysfunction 08/15/2021   Varicose veins of left lower extremity 08/10/2021   Arthritis of carpometacarpal (CMC) joint of left thumb 07/13/2021   Nail dystrophy 06/08/2021   Thoracic back pain 05/02/2021   BPPV  (benign paroxysmal positional vertigo), bilateral 10/23/2020   Superficial vein thrombosis 08/08/2020   Seasonal allergies 07/06/2020   GERD (gastroesophageal reflux disease) 06/13/2020   S/P TURP 03/22/2020   Healthcare maintenance 05/25/2019   Prediabetes 04/14/2019   Episodic tension-type headache, not intractable 09/30/2015   Hypertension 10/31/2014   Onychomycosis 12/11/2011   HIV disease (HCC) 12/28/2007   Osteoarthritis 12/28/2007     Problem List Items Addressed This Visit       Cardiovascular and Mediastinum   Hypertension (Chronic)   Blood pressure appears adequately controlled with current regimen and no adverse side effects or neurological / ophthalmologic signs/symptoms. Continue current dose of olmsartan-amlodipine-hydrochlorothiazide.        Venous stasis dermatitis of left lower extremity   Chad Little appears to have symptoms of lower extremity edema that are consistent with venous stasis. Recommend continuing to elevate legs when seated and compression socks as able. Has follow up with Vascular Surgery. No intervention needed at this time.         Genitourinary   Chronic kidney disease, stage 3a [facility])   Chad Little eGFR is 38 consistent with chronic kidney disease Stage 3a. Discussed importance of controlling chronic conditions and optimizing hydration. Changed ART regimen to Dovato eliminating tenofovir to aid in optimizing renal function despite it being low risk. Will continue to monitor.         Other   HIV disease (HCC) - Primary (Chronic)   Chad Little continues to have well-controlled virus with good adherence and tolerance to Tivicay and Descovy.  Reviewed previous lab work and discussed plan of care and U equals U.  Evaluated options for change of medication with changes in renal function to remove tenofovir to reduce any risk of renal compromise despite the risk factor being significantly low.  Following discussion we will change medication to Dovato.   Recommend follow-up lab work in 1 month to recheck viral  load.  Check blood work today.  Plan for follow-up in 9 months or sooner if needed with lab work on the same day.       Relevant Medications   dolutegravir-lamiVUDine (DOVATO) 50-300 MG tablet   Healthcare maintenance   Discussed importance of safe sexual practice and condom use. Condoms and site specific STD testing offered.  Vaccinations reviewed - Shingrix up to date; recommend PPSV23 after the age of 16.  Routine dental care up to date.  Colon cancer screening  up to date date.         I have discontinued Wyn Quaker "Chris"'s Tivicay and emtricitabine-tenofovir AF. I am also having him start on Dovato. Additionally, I am having him maintain his traMADol, methocarbamol, triamcinolone cream, ketoconazole, atorvastatin, fluticasone, clotrimazole, Olmesartan-amLODIPine-HCTZ, sildenafil, pantoprazole, and polyethylene glycol.   Meds ordered this encounter  Medications   dolutegravir-lamiVUDine (DOVATO) 50-300 MG tablet    Sig: Take 1 tablet by mouth daily.    Dispense:  30 tablet    Refill:  5    Discontinue Tivicay and Descovy    Supervising Provider:   Judyann Munson [phone]    Prescription Type::   Renewal     Follow-up: Return in about 9 months (around 12/23/2023), or if symptoms worsen or fail to improve. or sooner if needed.    Marcos Eke, MSN, FNP-C Nurse Practitioner [facility] Main number: [phone]

## 2023-03-26 ENCOUNTER — Encounter: Payer: Self-pay | Admitting: Family

## 2023-03-26 DIAGNOSIS — N1831 Chronic kidney disease, stage 3a: Secondary | ICD-10-CM | POA: Insufficient documentation

## 2023-03-26 NOTE — Assessment & Plan Note (Signed)
Chad Little continues to have well-controlled virus with good adherence and tolerance to Tivicay and Descovy.  Reviewed previous lab work and discussed plan of care and U equals U.  Evaluated options for change of medication with changes in renal function to remove tenofovir to reduce any risk of renal compromise despite the risk factor being significantly low.  Following discussion we will change medication to Dovato.  Recommend follow-up lab work in 1 month to recheck viral load.  Check blood work today.  Plan for follow-up in 9 months or sooner if needed with lab work on the same day.

## 2023-03-26 NOTE — Assessment & Plan Note (Signed)
Discussed importance of safe sexual practice and condom use. Condoms and site specific STD testing offered.  Vaccinations reviewed - Shingrix up to date; recommend PPSV23 after the age of 57.  Routine dental care up to date.  Colon cancer screening  up to date date.

## 2023-03-26 NOTE — Assessment & Plan Note (Signed)
Blood pressure appears adequately controlled with current regimen and no adverse side effects or neurological / ophthalmologic signs/symptoms. Continue current dose of olmsartan-amlodipine-hydrochlorothiazide.

## 2023-03-26 NOTE — Assessment & Plan Note (Signed)
Mr. Joslyn eGFR is 47 consistent with chronic kidney disease Stage 3a. Discussed importance of controlling chronic conditions and optimizing hydration. Changed ART regimen to Dovato eliminating tenofovir to aid in optimizing renal function despite it being low risk. Will continue to monitor.

## 2023-03-26 NOTE — Assessment & Plan Note (Signed)
Mr. Teems appears to have symptoms of lower extremity edema that are consistent with venous stasis. Recommend continuing to elevate legs when seated and compression socks as able. Has follow up with Vascular Surgery. No intervention needed at this time.

## 2023-04-02 ENCOUNTER — Ambulatory Visit: Payer: Medicaid Other | Admitting: Student

## 2023-04-02 VITALS — BP 140/94 | HR 97 | Temp 97.7°F | Ht 77.0 in | Wt 265.7 lb

## 2023-04-02 DIAGNOSIS — R21 Rash and other nonspecific skin eruption: Secondary | ICD-10-CM

## 2023-04-02 NOTE — Progress Notes (Unsigned)
 CC: Follow-up  HPI:  [name] is a 63 y.o. male living with a history stated below and presents today for follow-up. Please see problem based assessment and plan for additional details.  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 30 tablet 11   clotrimazole (LOTRIMIN) 1 % cream Apply 1 Application topically 2 (two) times daily. 30 g 0   dolutegravir-lamiVUDine (DOVATO) 50-300 MG tablet Take 1 tablet by mouth daily. 30 tablet 5   fluticasone (FLONASE) 50 MCG/ACT nasal spray Place 2 sprays into both nostrils 2 (two) times daily. 18.2 mL 2   ketoconazole (NIZORAL) 2 % cream Apply 1 Application topically 2 (two) times daily. 60 g 0   methocarbamol (ROBAXIN) 500 MG tablet Take 1 tablet (500 mg total) by mouth 2 (two) times daily. (Patient not taking: Reported on 03/25/2023) 30 tablet 0   Olmesartan-amLODIPine-HCTZ 40-5-25 MG TABS Take 1 tablet by mouth daily. 30 tablet 2   pantoprazole (PROTONIX) 40 MG tablet Take 1 tablet (40 mg total) by mouth 2 (two) times daily. 180 tablet 2   polyethylene glycol (MIRALAX) 17 g packet Take 17 g by mouth daily. 14 each 0   sildenafil (VIAGRA) 50 MG tablet Take 1 tablet (50 mg total) by mouth as needed for erectile dysfunction. 20 tablet 3   traMADol (ULTRAM) 50 MG tablet Take 1 tablet (50 mg total) by mouth every 6 (six) hours as needed. (Patient not taking: Reported on 03/25/2023) 15 tablet 0   triamcinolone cream (KENALOG) 0.1 % Apply 1 Application topically 2 (two) times daily. 30 g 0   No current facility-administered medications on file prior to visit.     Family History  Problem Relation Age of Onset   Alcohol abuse Mother    Hypertension Father    Stroke Father     Social History   Socioeconomic History   Marital status: Single    Spouse name: Not on file   Number of children: Not on file   Years of education: Not on file   Highest education level: Not on file  Occupational History   Not on file  Tobacco Use   Smoking status: Never   Smokeless tobacco: Never  Vaping Use   Vaping status: Never Used  Substance and Sexual Activity   Alcohol use: Not Currently    Alcohol/week: 0.0 standard drinks of alcohol    Comment: none since 2017   Drug use: Not Currently    Types: Marijuana, "Crack" cocaine    Comment:  cocaine and marijuana none since 2017   Sexual activity: Not Currently    Comment: refused  condoms 11/09/2019  Other Topics Concern   Not on file  Social History Narrative   Not on file   Social Drivers of Health   Financial Resource Strain: Not on file  Food Insecurity: No Food Insecurity (03/05/2022)   Hunger Vital Sign    Worried About Running Out of Food in the Last Year: Never true    Ran Out of Food in the Last Year: Never true  Transportation Needs: No Transportation Needs (03/05/2022)   PRAPARE - Administrator, Civil Service (Medical): No    Lack of Transportation (Non-Medical): No  Physical Activity: Not on file  Stress: Not on file  Social Connections: Moderately Isolated (03/05/2022)   Social Connection and Isolation Panel [NHANES]    Frequency of Communication with Friends and Family: More than three times a week    Frequency of Social Gatherings with Friends and Family: More than three times a week    Attends Religious Services: More than 4 times per year    Active Member of [employer] or Organizations: No    Attends Banker Meetings: Never    Marital Status: Never married  Intimate Partner Violence: Not At Risk (03/05/2022)   Humiliation, Afraid, Rape, and Kick  questionnaire    Fear of Current or Ex-Partner: No    Emotionally Abused: No    Physically Abused: No    Sexually Abused: No    Review of Systems: ROS negative except for what is noted on the assessment and plan.  Vitals:   04/02/23 0857  BP: (!) 140/94  Pulse: 97  Temp: 97.7 F (36.5 C)  TempSrc: Oral  SpO2: 98%  Weight: 265 lb 11.2 oz (120.5 kg)  Height: 6\' 5"  (1.956 m)    Physical Exam: Constitutional: well-appearing, sitting in chair, in no acute distress Cardiovascular: regular rate and rhythm, no m/r/g Pulmonary/Chest: normal work of breathing on room air, lungs clear to auscultation bilaterally Skin: LLE with scattered ulcerations and darkened deposits (consistent with hemosiderin) both most notable on medial side, forehead with scattered hypopigmented patches    Assessment & Plan:     Patient discussed with Dr. Cleda Daub  Rash Of LLE and forehead. See physical exam as lesions appear to be too separate processes. Has been seen a few times for this. LLE rash has not improved with compression stockings, leg elevation, steroid cream. Rash on forehead has not improved with ketoconazole. No exacerbating/alleviating factors. Given refractory nature, will refer to dermatology.   Carmina Miller, D.O. [facility], PGY-1 Phone: [phone] Date 04/03/2023 Time 8:28 AM

## 2023-04-02 NOTE — Patient Instructions (Signed)
 I have sent in a referral to Dermatology.

## 2023-04-03 NOTE — Assessment & Plan Note (Addendum)
 Of LLE and forehead. See physical exam as lesions appear to be too separate processes. Has been seen a few times for this. LLE rash has not improved with compression stockings, leg elevation, steroid cream. Rash on forehead has not improved with ketoconazole. No exacerbating/alleviating factors. Given refractory nature, will refer to dermatology.

## 2023-04-05 DIAGNOSIS — Z419 Encounter for procedure for purposes other than remedying health state, unspecified: Secondary | ICD-10-CM | POA: Diagnosis not present

## 2023-04-06 NOTE — Progress Notes (Signed)
 Internal Medicine Clinic Attending  Case discussed with the resident at the time of the visit.  We reviewed the resident's history and exam and pertinent patient test results.  I agree with the assessment, diagnosis, and plan of care documented in the resident's note.

## 2023-04-11 ENCOUNTER — Other Ambulatory Visit: Payer: Self-pay | Admitting: Student

## 2023-04-11 DIAGNOSIS — J302 Other seasonal allergic rhinitis: Secondary | ICD-10-CM

## 2023-04-11 NOTE — Telephone Encounter (Signed)
 Medication sent to pharmacy

## 2023-04-15 ENCOUNTER — Other Ambulatory Visit: Payer: Self-pay | Admitting: Student

## 2023-04-15 DIAGNOSIS — B354 Tinea corporis: Secondary | ICD-10-CM

## 2023-04-15 NOTE — Telephone Encounter (Signed)
 Copied from CRM [id number]. Topic: Clinical - Medication Refill >> Apr 15, 2023  9:48 AM Antony Haste wrote: Most Recent Primary Care Visit:  Provider: Carmina Miller  Department: [facility]  Visit Type: OPEN ESTABLISHED  Date: 04/02/2023  Medication: ketoconazole (NIZORAL) 2 % cream  Has the patient contacted their pharmacy? Yes (Agent: If no, request that the patient contact the pharmacy for the refill. If patient does not wish to contact the pharmacy document the reason why and proceed with request.) (Agent: If yes, when and what did the pharmacy advise?)  Is this the correct pharmacy for this prescription? Yes If no, delete pharmacy and type the correct one.  This is the patient's preferred pharmacy:  [id number] - [place], [place] - [address] AT [facility] [address] [place] [address] Phone: [phone] Fax: [phone]   Has the prescription been filled recently? No  Is the patient out of the medication? Yes  Has the patient been seen for an appointment in the last year OR does the patient have an upcoming appointment? Yes  Can we respond through MyChart? No, callback preferred.  Agent: Please be advised that Rx refills may take up to 3 business days. We ask that you follow-up with your pharmacy.

## 2023-04-17 MED ORDER — KETOCONAZOLE 2 % EX CREA: 1.0000 | TOPICAL_CREAM | Freq: Two times a day (BID) | CUTANEOUS | 0 refills | Status: AC

## 2023-04-18 ENCOUNTER — Ambulatory Visit: Admitting: Internal Medicine

## 2023-04-18 VITALS — BP 129/90 | HR 85 | Temp 97.5°F | Ht 77.0 in | Wt 264.9 lb

## 2023-04-18 DIAGNOSIS — M7662 Achilles tendinitis, left leg: Secondary | ICD-10-CM | POA: Diagnosis not present

## 2023-04-18 DIAGNOSIS — M7661 Achilles tendinitis, right leg: Secondary | ICD-10-CM

## 2023-04-18 DIAGNOSIS — R140 Abdominal distension (gaseous): Secondary | ICD-10-CM | POA: Diagnosis not present

## 2023-04-18 DIAGNOSIS — M7660 Achilles tendinitis, unspecified leg: Secondary | ICD-10-CM | POA: Insufficient documentation

## 2023-04-18 NOTE — Progress Notes (Signed)
 Subjective:  CC: stomach concerns  HPI:  [name] is a 63 y.o. male with a past medical history of chronic idiopathic constipation, HTN, CKD stage 3a,  and GERD who presents today for abdominal discomfort and bilateral heel pain.   He was last seen at the beginning of February for acid reflux, and constipation that were stable on PPI and MiraLAX. Colonoscopy completed in December 2024.  Single 5 mm polyp found in sigmoid colon.  Please see problem based assessment and plan for additional details.  Past Medical History:  Diagnosis Date   Arthritis    Enlarged prostate    Essential hypertension    GERD (gastroesophageal reflux disease)    Gout    diet controlled no recent flare ups   HIV infection (HCC)    Superficial vein thrombosis 08/08/2020    MEDICATIONS:  Dovato Olmesartan-amlodpine-HCT 40/5/25 Atorvastatin 10 Pantoprazole 40 mg   Family History  Problem Relation Age of Onset   Alcohol abuse Mother    Hypertension Father    Stroke Father     Past Surgical History:  Procedure Laterality Date   APPENDECTOMY  2005   open   Right elbow surgery  dec3 2020   in [place]   TRANSURETHRAL RESECTION OF PROSTATE N/A 02/14/2020   Procedure: TRANSURETHRAL RESECTION OF THE PROSTATE (TURP), BIPOLAR;  Surgeon: Jannifer Hick, MD;  Location: [facility];  Service: Urology;  Laterality: N/A;     Social History   Socioeconomic History   Marital status: Single    Spouse name: Not on file   Number of children: Not on file   Years of education: Not on file   Highest education level: Not on file  Occupational History   Not on file  Tobacco Use   Smoking status: Never   Smokeless tobacco: Never  Vaping Use   Vaping status: Never Used  Substance and Sexual Activity   Alcohol use: Not Currently    Alcohol/week: 0.0 standard drinks of alcohol    Comment: none since 2017   Drug use: Not Currently    Types: Marijuana, "Crack" cocaine     Comment:  cocaine and marijuana none since 2017   Sexual activity: Not Currently    Comment: refused  condoms 11/09/2019  Other Topics Concern   Not on file  Social History Narrative   Not on file   Social Drivers of Health   Financial Resource Strain: Not on file  Food Insecurity: No Food Insecurity (03/05/2022)   Hunger Vital Sign    Worried About Running Out of Food in the Last Year: Never true    Ran Out of Food in the Last Year: Never true  Transportation Needs: No Transportation Needs (03/05/2022)   PRAPARE - Administrator, Civil Service (Medical): No    Lack of Transportation (Non-Medical): No  Physical Activity: Not on file  Stress: Not on file  Social Connections: Moderately Isolated (03/05/2022)   Social Connection and Isolation Panel [NHANES]    Frequency of Communication with Friends and Family: More than three times a week    Frequency of Social Gatherings with Friends and Family: More than three times a week    Attends Religious Services: More than 4 times per year    Active Member of [employer] or Organizations: No    Attends Banker Meetings: Never    Marital Status: Never married  Intimate Partner Violence: Not At Risk (03/05/2022)   Humiliation, Afraid,  Rape, and Kick questionnaire    Fear of Current or Ex-Partner: No    Emotionally Abused: No    Physically Abused: No    Sexually Abused: No    Review of Systems: ROS negative except for what is noted on the assessment and plan.  Objective:   Vitals:   04/18/23 0844  BP: (!) 129/90  Pulse: 85  Temp: (!) 97.5 F (36.4 C)  TempSrc: Oral  SpO2: 97%  Weight: 264 lb 14.4 oz (120.2 kg)  Height: 6\' 5"  (1.956 m)    Physical Exam: Constitutional: well-appearing, in no acute distress Cardiovascular: regular rate and rhythm, no m/r/g Pulmonary/Chest: normal work of breathing on room air, lungs clear to auscultation bilaterally Abdominal: gurgling appreciated on auscultation, soft,  non-tender, non-distended MSK:  Left heel - Inspection: mild edema at calcaneus without erythema or ecchymosis - Palpation: pain at insertion of achilles tendon, mild pain over length of achilles tendon - ROM: full ROM with plantarflexion and dorsiflexion, pain in heel with plantarflexion - Strength: 5/5  - Neuro/vasc: 2+ DP  Right heel - Inspection: no edema, erythema or ecchymosis - Palpation: pain at insertion of achilles tendon - ROM: full ROM with plantarflexion and dorsiflexion, pain in heel with plantarflexion - Strength: 5/5 - Neuro/vasc: 2+ DP  Assessment & Plan:  Abdominal bloating Patient presenting with abdominal bleeding for the past 5 weeks.  He noticed this after eating a bunch of grapes as he was trying to cut down on the amount of desserts that he was eating.  Since stopping eating grapes symptoms have been better but he continues to have bloating.  He has a lot of flatulence that happens at night and symptoms are relieved following that.  Send reflux remains well-controlled on PPI.  He denies constipation or diarrhea.  Weight is stable. P: I talked with him about FODMAP diet.  I do not want him to have an overly restrictive diet but recommended that he eliminate a food and wait a few days to see if symptoms improved and make a food diary. He will follow-up in a month if symptoms have not improved with this method  Insertional Achilles tendinopathy He presents with several year history of bilateral heel pain.  He was recently at a [employer] when he rolled his left foot and pain increased.  He had difficulty walking and that is what made him decide to have this addressed.  He has been using Voltaren gel to his heels.  He wears [employer] daily and also has house shoes for at home.  Pain is worst when he goes barefoot at home.  On exam he has pain to insertions of Achilles bilaterally.  His left ankle does have some edema but I think this is from ankle sprain a  few weeks ago.   A/P: Chronic Achilles tendinopathy Referral placed to physical therapy.  I talked with him about freezing water bottles and doing gentle massages of his Achilles to see if this helps the pain until he is able to get in with PT.    Patient discussed with Dr. Marjorie Smolder Taneil Lazarus, D.O. [facility]  PGY-3 Pager: [phone]  Phone: [phone] Date 04/18/2023  Time 11:30 AM

## 2023-04-18 NOTE — Assessment & Plan Note (Signed)
 He presents with several year history of bilateral heel pain.  He was recently at a [employer] when he rolled his left foot and pain increased.  He had difficulty walking and that is what made him decide to have this addressed.  He has been using Voltaren gel to his heels.  He wears [employer] daily and also has house shoes for at home.  Pain is worst when he goes barefoot at home.  On exam he has pain to insertions of Achilles bilaterally.  His left ankle does have some edema but I think this is from ankle sprain a few weeks ago.   A/P: Chronic Achilles tendinopathy Referral placed to physical therapy.  I talked with him about freezing water bottles and doing gentle massages of his Achilles to see if this helps the pain until he is able to get in with PT.

## 2023-04-18 NOTE — Patient Instructions (Addendum)
 Thank you, Mr.Taniela AARIK BLANK for allowing [place] to provide your care today.  Bloating I think your thinking is correct about the food you were eating (grapes!) contributing to belly discomfort. I have included some pages on other foods that are known to be more gas producing and can cause belly upset. Please try to cut down on these foods and see if your symptoms are better. If you continue to have symptoms then the next thing I would try is to decrease the amount of dairy that you are eating.  Heel pain I think you have achilles tendinopathy and I am referring you to physical therapy for this.   Referrals ordered today:   Referral Orders         Ambulatory referral to Physical Therapy       I have ordered the following medication/changed the following medications:   Stop the following medications: Medications Discontinued During This Encounter  Medication Reason   traMADol (ULTRAM) 50 MG tablet Completed Course   methocarbamol (ROBAXIN) 500 MG tablet Completed Course     Start the following medications: No orders of the defined types were placed in this encounter.   We look forward to seeing you next time. Please call our clinic at [phone] if you have any questions or concerns. The best time to call is Monday-Friday from 9am-4pm, but there is someone available 24/7. If after hours or the weekend, call the main hospital number and ask for the Internal Medicine Resident On-Call. If you need medication refills, please notify your pharmacy one week in advance and they will send [place] a request.   Thank you for trusting me with your care. Wishing you the best!   Rudene Christians, DO [facility]

## 2023-04-18 NOTE — Assessment & Plan Note (Addendum)
 Patient presenting with abdominal bleeding for the past 5 weeks.  He noticed this after eating a bunch of grapes as he was trying to cut down on the amount of desserts that he was eating.  Since stopping eating grapes symptoms have been better but he continues to have bloating.  He has a lot of flatulence that happens at night and symptoms are relieved following that.  Send reflux remains well-controlled on PPI.  He denies constipation or diarrhea.  Weight is stable. P: I talked with him about FODMAP diet.  I do not want him to have an overly restrictive diet but recommended that he eliminate a food and wait a few days to see if symptoms improved and make a food diary. He will follow-up in a month if symptoms have not improved with this method

## 2023-04-23 NOTE — Progress Notes (Signed)
 Internal Medicine Clinic Attending  Case discussed with the resident at the time of the visit.  We reviewed the resident's history and exam and pertinent patient test results.  I agree with the assessment, diagnosis, and plan of care documented in the resident's note.

## 2023-05-05 ENCOUNTER — Other Ambulatory Visit: Payer: Self-pay | Admitting: *Deleted

## 2023-05-05 ENCOUNTER — Other Ambulatory Visit: Payer: Self-pay | Admitting: Student

## 2023-05-05 DIAGNOSIS — I82890 Acute embolism and thrombosis of other specified veins: Secondary | ICD-10-CM

## 2023-05-05 DIAGNOSIS — I872 Venous insufficiency (chronic) (peripheral): Secondary | ICD-10-CM

## 2023-05-05 NOTE — Therapy (Deleted)
 OUTPATIENT PHYSICAL THERAPY LOWER EXTREMITY EVALUATION   Patient Name: Chad Little MRN: [id number] DOB:1960/04/14, 63 y.o., male Today's Date: 05/05/2023  END OF SESSION:   Past Medical History:  Diagnosis Date   Arthritis    Enlarged prostate    Essential hypertension    GERD (gastroesophageal reflux disease)    Gout    diet controlled no recent flare ups   HIV infection (HCC)    Superficial vein thrombosis 08/08/2020   Past Surgical History:  Procedure Laterality Date   APPENDECTOMY  2005   open   Right elbow surgery  dec3 2020   in [place]   TRANSURETHRAL RESECTION OF PROSTATE N/A 02/14/2020   Procedure: TRANSURETHRAL RESECTION OF THE PROSTATE (TURP), BIPOLAR;  Surgeon: Jannifer Hick, MD;  Location: [facility];  Service: Urology;  Laterality: N/A;   Patient Active Problem List   Diagnosis Date Noted   Abdominal bloating 04/18/2023   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   Chronic kidney disease, stage 3a (HCC) 03/26/2023   Venous stasis dermatitis of left lower extremity 03/12/2023   Chronic idiopathic constipation 03/12/2023   Bacterial sinusitis 11/07/2022   Tinea corporis 08/22/2022   Rash 07/02/2022   Seborrheic dermatitis 12/21/2021   Blurry vision, left eye 09/20/2021   Erectile dysfunction 08/15/2021   Varicose veins of left lower extremity 08/10/2021   Arthritis of carpometacarpal (CMC) joint of left thumb 07/13/2021   Nail dystrophy 06/08/2021   Thoracic back pain 05/02/2021   BPPV (benign paroxysmal positional vertigo), bilateral 10/23/2020   Superficial vein thrombosis 08/08/2020   Seasonal allergies 07/06/2020   GERD (gastroesophageal reflux disease) 06/13/2020   S/P TURP 03/22/2020   Healthcare maintenance 05/25/2019   Prediabetes 04/14/2019   Episodic tension-type headache, not intractable 09/30/2015   Hypertension 10/31/2014   Onychomycosis 12/11/2011   HIV disease (HCC) 12/28/2007   Osteoarthritis  12/28/2007    PCP: ***  REFERRING PROVIDER: ***  REFERRING DIAG: ***  THERAPY DIAG:  No diagnosis found.  Rationale for Evaluation and Treatment: {HABREHAB:27488}  ONSET DATE: ***  SUBJECTIVE:   SUBJECTIVE STATEMENT: ***  PERTINENT HISTORY: *** PAIN:  Are you having pain? {OPRCPAIN:27236}  PRECAUTIONS: {Therapy precautions:24002}  RED FLAGS: {PT Red Flags:29287}   WEIGHT BEARING RESTRICTIONS: {Yes ***/No:24003}  FALLS:  Has patient fallen in last 6 months? {fallsyesno:27318}  LIVING ENVIRONMENT: Lives with: {OPRC lives with:25569::"lives with their family"} Lives in: {Lives in:25570} Stairs: {opstairs:27293} Has following equipment at home: {Assistive devices:23999}  OCCUPATION: ***  PLOF: {PLOF:24004}  PATIENT GOALS: ***  NEXT MD VISIT: ***  OBJECTIVE:  Note: Objective measures were completed at Evaluation unless otherwise noted.  DIAGNOSTIC FINDINGS: ***  PATIENT SURVEYS:  {rehab surveys:24030}  COGNITION: Overall cognitive status: {cognition:24006}     SENSATION: {sensation:27233}  EDEMA:  {edema:24020}  MUSCLE LENGTH: Hamstrings: Right *** deg; Left *** deg Maisie Fus test: Right *** deg; Left *** deg  POSTURE: {posture:25561}  PALPATION: ***  LOWER EXTREMITY ROM:  {AROM/PROM:27142} ROM Right eval Left eval  Hip flexion    Hip extension    Hip abduction    Hip adduction    Hip internal rotation    Hip external rotation    Knee flexion    Knee extension    Ankle dorsiflexion    Ankle plantarflexion    Ankle inversion    Ankle eversion     (Blank rows = not tested)  LOWER EXTREMITY MMT:  MMT Right eval Left eval  Hip flexion    Hip extension  Hip abduction    Hip adduction    Hip internal rotation    Hip external rotation    Knee flexion    Knee extension    Ankle dorsiflexion    Ankle plantarflexion    Ankle inversion    Ankle eversion     (Blank rows = not tested)  LOWER EXTREMITY SPECIAL TESTS:   {LEspecialtests:26242}  FUNCTIONAL TESTS:  {Functional tests:24029}  GAIT: Distance walked: *** Assistive device utilized: {Assistive devices:23999} Level of assistance: {Levels of assistance:24026} Comments: ***                                                                                                                                TREATMENT DATE: ***    PATIENT EDUCATION:  Education details: *** Person educated: {Person educated:25204} Education method: {Education Method:25205} Education comprehension: {Education Comprehension:25206}  HOME EXERCISE PROGRAM: ***  ASSESSMENT:  CLINICAL IMPRESSION: Patient is a *** y.o. *** who was seen today for physical therapy evaluation and treatment for ***.   OBJECTIVE IMPAIRMENTS: {opptimpairments:25111}.   ACTIVITY LIMITATIONS: {activitylimitations:27494}  PARTICIPATION LIMITATIONS: {participationrestrictions:25113}  PERSONAL FACTORS: {Personal factors:25162} are also affecting patient's functional outcome.   REHAB POTENTIAL: {rehabpotential:25112}  CLINICAL DECISION MAKING: {clinical decision making:25114}  EVALUATION COMPLEXITY: {Evaluation complexity:25115}   GOALS: Goals reviewed with patient? {yes/no:20286}  SHORT TERM GOALS: Target date: *** *** Baseline: Goal status: INITIAL  2.  *** Baseline:  Goal status: INITIAL  3.  *** Baseline:  Goal status: INITIAL  4.  *** Baseline:  Goal status: INITIAL  5.  *** Baseline:  Goal status: INITIAL  6.  *** Baseline:  Goal status: INITIAL  LONG TERM GOALS: Target date: ***  *** Baseline:  Goal status: INITIAL  2.  *** Baseline:  Goal status: INITIAL  3.  *** Baseline:  Goal status: INITIAL  4.  *** Baseline:  Goal status: INITIAL  5.  *** Baseline:  Goal status: INITIAL  6.  *** Baseline:  Goal status: INITIAL   PLAN:  PT FREQUENCY: {rehab frequency:25116}  PT DURATION: {rehab duration:25117}  PLANNED INTERVENTIONS:  {rehab planned interventions:25118::"97110-Therapeutic exercises","97530- Therapeutic [id number]- Neuromuscular re-education","97535- Self XLKG","40102- Manual therapy"}  PLAN FOR NEXT SESSION: ***   Hildred Laser, PT 05/05/2023, 3:21 PM

## 2023-05-05 NOTE — Telephone Encounter (Signed)
 Copied from CRM [id number]. Topic: Clinical - Medication Refill >> May 05, 2023  9:36 AM Dennison Nancy wrote: Most Recent Primary Care Visit:  Provider: Rudene Christians  Department: [facility]  Visit Type: ACUTE  Date: 04/18/2023  Medication: sildenafil (VIAGRA) 50 MG tablet  Has the patient contacted their pharmacy? No (Agent: If no, request that the patient contact the pharmacy for the refill. If patient does not wish to contact the pharmacy document the reason why and proceed with request.) (Agent: If yes, when and what did the pharmacy advise?)  Is this the correct pharmacy for this prescription? Yes If no, delete pharmacy and type the correct one.  This is the patient's preferred pharmacy:   [facility] [address] [place] [address] Phone: [phone] Fax: [phone]   Has the prescription been filled recently? No  Is the patient out of the medication? Yes  Has the patient been seen for an appointment in the last year OR does the patient have an upcoming appointment? Yes  Can we respond through MyChart? No   Agent: Please be advised that Rx refills may take up to 3 business days. We ask that you follow-up with your pharmacy.

## 2023-05-05 NOTE — Telephone Encounter (Signed)
 Cancelled reorder on medication, Viagra. Prescription sent with 3 refills on 03/06/23 to [facility]. Patient can call [facility] if he would like prescription transferred. Closing encounter.

## 2023-05-09 ENCOUNTER — Ambulatory Visit

## 2023-05-12 ENCOUNTER — Other Ambulatory Visit (HOSPITAL_COMMUNITY): Payer: Self-pay

## 2023-05-12 ENCOUNTER — Other Ambulatory Visit: Payer: Self-pay

## 2023-05-13 ENCOUNTER — Ambulatory Visit: Admitting: Physical Therapy

## 2023-05-14 ENCOUNTER — Other Ambulatory Visit: Payer: Self-pay | Admitting: Internal Medicine

## 2023-05-14 DIAGNOSIS — I10 Essential (primary) hypertension: Secondary | ICD-10-CM

## 2023-05-14 NOTE — Telephone Encounter (Signed)
 Medication sent to pharmacy

## 2023-05-15 ENCOUNTER — Encounter: Payer: Self-pay | Admitting: Vascular Surgery

## 2023-05-15 ENCOUNTER — Ambulatory Visit (HOSPITAL_COMMUNITY)
Admission: RE | Admit: 2023-05-15 | Discharge: 2023-05-15 | Disposition: A | Discharge status: Home / Self Care | Payer: Medicaid Other | Source: Ambulatory Visit | Attending: Vascular Surgery | Admitting: Vascular Surgery

## 2023-05-15 ENCOUNTER — Ambulatory Visit: Payer: Medicaid Other | Admitting: Vascular Surgery

## 2023-05-15 VITALS — BP 112/81 | HR 82 | Temp 98.0°F | Resp 18 | Ht 77.0 in | Wt 258.3 lb

## 2023-05-15 DIAGNOSIS — I872 Venous insufficiency (chronic) (peripheral): Secondary | ICD-10-CM | POA: Diagnosis not present

## 2023-05-15 DIAGNOSIS — I82890 Acute embolism and thrombosis of other specified veins: Secondary | ICD-10-CM | POA: Insufficient documentation

## 2023-05-15 NOTE — Progress Notes (Signed)
 Office Note     CC: Left lower extremity rash Requesting Provider:  Tyson Alias,*  HPI: Chad Little is a 63 y.o. (02/04/1961) male who presents at the request of Carmina Miller, DO for evaluation of left lower extremity rash.  On exam, Chad Little was doing well.  A native of [place], he has lived in [place] for decades.  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 Chad Little has appreciated a rash on his left leg for quite some time.  He stated he had the rash years ago when wearing tall cotton socks, however the rash resolved when wearing foot ease.  Over the last several months, he has been wearing compression stockings, and the rash returned.  He is taking compressions off over the last 6 days, and thinks that the irritated areas are getting better. Chad Little has chronic skin changes to the left lower extremity for a number of years.  He denies significant heaviness, or tired feeling by the end of the day.  Denies itching, denies swelling.  Denies bleeding, denies any ulceration.  He has worn compression in the past. Denies previous venous procedures.  No history of DVT.  Denies symptoms of claudication, ischemic rest pain, tissue loss.    Past Medical History:  Diagnosis Date   Arthritis    Enlarged prostate    Essential hypertension    GERD (gastroesophageal reflux disease)    Gout    diet controlled no recent flare ups   HIV infection (HCC)    Superficial vein thrombosis 08/08/2020    Past Surgical History:  Procedure Laterality Date   APPENDECTOMY  2005   open   Right elbow surgery  dec3 2020   in [place]   TRANSURETHRAL RESECTION OF PROSTATE N/A 02/14/2020   Procedure: TRANSURETHRAL RESECTION OF THE PROSTATE (TURP), BIPOLAR;  Surgeon: Jannifer Hick, MD;  Location: [facility];  Service: Urology;  Laterality: N/A;    Social History   Socioeconomic History   Marital status: Single    Spouse name: Not on file    Number of children: Not on file   Years of education: Not on file   Highest education level: Not on file  Occupational History   Not on file  Tobacco Use   Smoking status: Never   Smokeless tobacco: Never  Vaping Use   Vaping status: Never Used  Substance and Sexual Activity   Alcohol use: Not Currently    Alcohol/week: 0.0 standard drinks of alcohol    Comment: none since 2017   Drug use: Not Currently    Types: Marijuana, "Crack" cocaine    Comment:  cocaine and marijuana none since 2017   Sexual activity: Not Currently    Comment: refused  condoms 11/09/2019  Other Topics Concern   Not on file  Social History Narrative   Not on file   Social Drivers of Health   Financial Resource Strain: Not on file  Food Insecurity: No Food Insecurity (03/05/2022)   Hunger Vital Sign    Worried About Running Out of Food in the Last Year: Never true    Ran Out of Food in the Last Year: Never true  Transportation Needs: No Transportation Needs (03/05/2022)   PRAPARE - Administrator, Civil Service (Medical): No    Lack of Transportation (Non-Medical): No  Physical Activity: Not on file  Stress: Not on file  Social Connections: Moderately Isolated (03/05/2022)   Social Connection and Isolation Panel [NHANES]  Frequency of Communication with Friends and Family: More than three times a week    Frequency of Social Gatherings with Friends and Family: More than three times a week    Attends Religious Services: More than 4 times per year    Active Member of [employer] or Organizations: No    Attends Banker Meetings: Never    Marital Status: Never married  Intimate Partner Violence: Not At Risk (03/05/2022)   Humiliation, Afraid, Rape, and Kick questionnaire    Fear of Current or Ex-Partner: No    Emotionally Abused: No    Physically Abused: No    Sexually Abused: No   Family History  Problem Relation Age of Onset   Alcohol abuse Mother    Hypertension Father     Stroke Father     Current Outpatient Medications  Medication Sig Dispense Refill   atorvastatin (LIPITOR) 10 MG tablet Take 1 tablet (10 mg total) by mouth daily. 30 tablet 11   clotrimazole (LOTRIMIN) 1 % cream Apply 1 Application topically 2 (two) times daily. 30 g 0   dolutegravir-lamiVUDine (DOVATO) 50-300 MG tablet Take 1 tablet by mouth daily. 30 tablet 5   fluticasone (FLONASE) 50 MCG/ACT nasal spray SHAKE LIQUID AND USE 2 SPRAYS IN EACH NOSTRIL TWICE DAILY 16 g 3   ketoconazole (NIZORAL) 2 % cream Apply 1 Application topically 2 (two) times daily. 60 g 0   Olmesartan-amLODIPine-HCTZ 40-5-25 MG TABS TAKE 1 TABLET BY MOUTH DAILY 30 tablet 2   pantoprazole (PROTONIX) 40 MG tablet Take 1 tablet (40 mg total) by mouth 2 (two) times daily. 180 tablet 2   polyethylene glycol (MIRALAX) 17 g packet Take 17 g by mouth daily. 14 each 0   sildenafil (VIAGRA) 50 MG tablet Take 1 tablet (50 mg total) by mouth as needed for erectile dysfunction. 20 tablet 3   triamcinolone cream (KENALOG) 0.1 % Apply 1 Application topically 2 (two) times daily. 30 g 0   No current facility-administered medications for this visit.    No Known Allergies   REVIEW OF SYSTEMS:  [X]  denotes positive finding, [ ]  denotes negative finding Cardiac  Comments:  Chest pain or chest pressure:    Shortness of breath upon exertion:    Short of breath when lying flat:    Irregular heart rhythm:        Vascular    Pain in calf, thigh, or hip brought on by ambulation:    Pain in feet at night that wakes you up from your sleep:     Blood clot in your veins:    Leg swelling:         Pulmonary    Oxygen at home:    Productive cough:     Wheezing:         Neurologic    Sudden weakness in arms or legs:     Sudden numbness in arms or legs:     Sudden onset of difficulty speaking or slurred speech:    Temporary loss of vision in one eye:     Problems with dizziness:         Gastrointestinal    Blood in stool:      Vomited blood:         Genitourinary    Burning when urinating:     Blood in urine:        Psychiatric    Major depression:         Hematologic  Bleeding problems:    Problems with blood clotting too easily:        Skin    Rashes or ulcers:        Constitutional    Fever or chills:      PHYSICAL EXAMINATION:  Vitals:   05/15/23 1152  BP: 112/81  Pulse: 82  Resp: 18  Temp: 98 F (36.7 C)  TempSrc: Temporal  SpO2: 97%  Weight: 258 lb 4.8 oz (117.2 kg)  Height: 6\' 5"  (1.956 m)    General:  WDWN in NAD; vital signs documented above Gait: Not observed HENT: WNL, normocephalic Pulmonary: normal non-labored breathing , without Rales, rhonchi,  wheezing Cardiac: regular HR Abdomen: soft, NT, no masses Skin: without rashes Vascular Exam/Pulses:  Right Left  Radial 2+ (normal) 2+ (normal)  Ulnar    Femoral    Popliteal    DP 2+ (normal) 2+ (normal)  PT     Extremities: without ischemic changes, without Gangrene , without cellulitis; without open wounds;  Lipodermatosclerosis, telangiectasias, varicosities in the left lower extremity. Musculoskeletal: no muscle wasting or atrophy  Neurologic: A&O X 3;  No focal weakness or paresthesias are detected Psychiatric:  The pt has Normal affect.   Non-Invasive Vascular Imaging:   +------------------+---------+------+-----------+------------+-------------  ----+  LEFT             Reflux NoRefluxReflux TimeDiameter cmsComments                                        Yes                                             +------------------+---------+------+-----------+------------+-------------  ----+  CFV                              >1 second                                 +------------------+---------+------+-----------+------------+-------------  ----+  FV prox                           >1 second                                  +------------------+---------+------+-----------+------------+-------------  ----+  GSV at [facility]        no                            0.97                        +------------------+---------+------+-----------+------------+-------------  ----+  GSV prox thigh              yes    >500 ms      0.45                        +------------------+---------+------+-----------+------------+-------------  ----+  GSV mid thigh     no  0.17                        +------------------+---------+------+-----------+------------+-------------  ----+  GSV dist thigh    no                            0.14                        +------------------+---------+------+-----------+------------+-------------  ----+  GSV at knee                 yes    >500 ms      0.25                        +------------------+---------+------+-----------+------------+-------------  ----+  GSV prox calf     no                            0.30                        +------------------+---------+------+-----------+------------+-------------  ----+  GSV mid calf                       >500 ms      0.35                        +------------------+---------+------+-----------+------------+-------------  ----+  SSV Pop Fossa               yes    >500 ms      0.66                        +------------------+---------+------+-----------+------------+-------------  ----+  SSV prox calf               yes    >500 ms      0.50                        +------------------+---------+------+-----------+------------+-------------  ----+  SSV mid calf                yes    >500 ms      0.40                        +------------------+---------+------+-----------+------------+-------------  ----+  Posterior Calf              yes    >500 ms      0.72    Posterior  Calf -   Varicosity                                               communicates  with                                                         SSV                 +------------------+---------+------+-----------+------------+-------------  ----+  Varicosity Distal           yes    >500 ms      0.37                        Calf Medial                                                                 +------------------+---------+------+-----------+------------+-------------     ASSESSMENT/PLAN:: 63 y.o. male presenting with deep and superficial reflux in the left leg.  I think that this is independent of the rash that has appeared.  On exam, he has chronic skin changes consistent with a mild amount of lipodermatosclerosis, with moderate hemosiderin deposition.  He has some mild edema as well. Palpable pulse in the foot.  The rash is raised, and appears irritated.  I think he would be best treated with removal of the compression stockings in an effort to let the dermis heal.  I am unsure as to the etiology of the rash-skin allergy versus fungal.  I think he would benefit from dermatology referral for further workup.  Chad Little and I had a long discussion regarding his venous insufficiency, namely that he is relatively asymptomatic, and that he would benefit most from compression-when his rash heals, and elevation.  I gave him my card and asked him to call should symptoms become more severe as I would be happy to see him to discuss intervention options.    Victorino Sparrow, MD [facility] [phone]

## 2023-05-17 DIAGNOSIS — Z419 Encounter for procedure for purposes other than remedying health state, unspecified: Secondary | ICD-10-CM | POA: Diagnosis not present

## 2023-05-20 ENCOUNTER — Ambulatory Visit: Attending: Internal Medicine

## 2023-05-20 ENCOUNTER — Other Ambulatory Visit: Payer: Self-pay

## 2023-05-20 DIAGNOSIS — M7662 Achilles tendinitis, left leg: Secondary | ICD-10-CM | POA: Insufficient documentation

## 2023-05-20 DIAGNOSIS — R262 Difficulty in walking, not elsewhere classified: Secondary | ICD-10-CM | POA: Diagnosis not present

## 2023-05-20 DIAGNOSIS — M25572 Pain in left ankle and joints of left foot: Secondary | ICD-10-CM | POA: Insufficient documentation

## 2023-05-20 DIAGNOSIS — M7661 Achilles tendinitis, right leg: Secondary | ICD-10-CM | POA: Insufficient documentation

## 2023-05-20 DIAGNOSIS — M25571 Pain in right ankle and joints of right foot: Secondary | ICD-10-CM | POA: Insufficient documentation

## 2023-05-20 NOTE — Therapy (Signed)
 OUTPATIENT PHYSICAL THERAPY LOWER EXTREMITY EVALUATION   Patient Name: Chad Little MRN: [id number] DOB:04/21/1960, 63 y.o., male Today's Date: 05/20/2023  END OF SESSION:  PT End of Session - 05/20/23 0831     Visit Number 1    Number of Visits 12    Authorization Type [employer]    Authorization - Visit Number 1    PT Start Time 0831    PT Stop Time 0910    PT Time Calculation (min) 39 min    Activity Tolerance Patient tolerated treatment well    Behavior During Therapy [facility] for tasks assessed/performed             Past Medical History:  Diagnosis Date   Arthritis    Enlarged prostate    Essential hypertension    GERD (gastroesophageal reflux disease)    Gout    diet controlled no recent flare ups   HIV infection (HCC)    Superficial vein thrombosis 08/08/2020   Past Surgical History:  Procedure Laterality Date   APPENDECTOMY  2005   open   Right elbow surgery  dec3 2020   in [place]   TRANSURETHRAL RESECTION OF PROSTATE N/A 02/14/2020   Procedure: TRANSURETHRAL RESECTION OF THE PROSTATE (TURP), BIPOLAR;  Surgeon: Lahoma Pigg, MD;  Location: [facility];  Service: Urology;  Laterality: N/A;   Patient Active Problem List   Diagnosis Date Noted   Abdominal bloating 04/18/2023   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   Chronic kidney disease, stage 3a (HCC) 03/26/2023   Venous stasis dermatitis of left lower extremity 03/12/2023   Chronic idiopathic constipation 03/12/2023   Bacterial sinusitis 11/07/2022   Tinea corporis 08/22/2022   Rash 07/02/2022   Seborrheic dermatitis 12/21/2021   Blurry vision, left eye 09/20/2021   Erectile dysfunction 08/15/2021   Varicose veins of left lower extremity 08/10/2021   Arthritis of carpometacarpal (CMC) joint of left thumb 07/13/2021   Nail dystrophy 06/08/2021   Thoracic back pain 05/02/2021   BPPV (benign paroxysmal positional vertigo), bilateral 10/23/2020   Superficial vein  thrombosis 08/08/2020   Seasonal allergies 07/06/2020   GERD (gastroesophageal reflux disease) 06/13/2020   S/P TURP 03/22/2020   Healthcare maintenance 05/25/2019   Prediabetes 04/14/2019   Episodic tension-type headache, not intractable 09/30/2015   Hypertension 10/31/2014   Onychomycosis 12/11/2011   HIV disease (HCC) 12/28/2007   Osteoarthritis 12/28/2007    PCP: Ronni Colace, DO  REFERRING PROVIDER: Sherol Dixie, MD  REFERRING DIAG:  [phone] (ICD-10-CM) - Insertional tendinopathy of both Achilles tendons    THERAPY DIAG:  Pain in left ankle and joints of left foot  Pain in right ankle and joints of right foot  Difficulty in walking, not elsewhere classified  Rationale for Evaluation and Treatment: Rehabilitation  ONSET DATE: "8 months ago"  SUBJECTIVE:   SUBJECTIVE STATEMENT: Patient reports to PT with BIL heel pain that has been present for about 8 months. He does not recall any injury. He states that he has to stand for a minute before he can start walking in the morning, unless he puts on his slides. I still feel the pain with the shoes on, but its not as severe. He stays that the pain only bothers him after laying down, "it could be 1 minute or 1 hour."   PERTINENT HISTORY: Relevant PMHx includes osteoarthritis, gout, HIV, Stage 3 CKD, HTN  PAIN:  Are you having pain?  Yes: NPRS scale: 8/10 Pain location: BIL heel Pain description: sharp  Aggravating factors: standing after laying down, crossing ankle while laying down  Relieving factors: putting on shoes  PRECAUTIONS: None  RED FLAGS: None   WEIGHT BEARING RESTRICTIONS: No  FALLS:  Has patient fallen in last 6 months? No  LIVING ENVIRONMENT: Lives with:  my sister Lives in: House/apartment Stairs: No Has following equipment at home: None  OCCUPATION: home improvement   PLOF: Independent  PATIENT GOALS: "To see why this is happening, and see what I can do about it"   NEXT MD  VISIT: none scheduled with referring provider at time of eval   OBJECTIVE:  Note: Objective measures were completed at Evaluation unless otherwise noted.  DIAGNOSTIC FINDINGS:   no relevant imaging results available in epic; none included in referral   PATIENT SURVEYS:  LEFS 71/80  COGNITION: Overall cognitive status: Within functional limits for tasks assessed      PALPATION: Mild tenderness to palpation around achilles tendon insertion BIL   LOWER EXTREMITY ROM:  Active ROM Right eval Left eval  Hip flexion    Hip extension    Hip abduction    Hip adduction    Hip internal rotation    Hip external rotation    Knee flexion    Knee extension    Ankle dorsiflexion 5 20  Ankle plantarflexion 30 45  Ankle inversion 25 20  Ankle eversion 5 2   (Blank rows = not tested)  LOWER EXTREMITY MMT:  MMT Right eval Left eval  Hip flexion    Hip extension    Hip abduction    Hip adduction    Hip internal rotation    Hip external rotation    Knee flexion    Knee extension    Ankle dorsiflexion    Ankle plantarflexion    Ankle inversion    Ankle eversion     (Blank rows = not tested)  GAIT: Distance walked: 50 ft Assistive device utilized: None Level of assistance: Complete Independence Comments: decreased ankle DF at initial contact BIL                                                                                                                                 TREATMENT DATE:   [facility] Adult PT Treatment:                                                DATE: 05/20/2023   Initial evaluation: see patient education and home exercise program as noted below      PATIENT EDUCATION:  Education details: reviewed initial home exercise program; discussion of POC, prognosis and goals for skilled PT   Person educated: Patient Education method: Explanation, Demonstration, and Handouts Education comprehension: verbalized understanding, returned demonstration, and needs  further education  HOME EXERCISE PROGRAM: Access Code: [email or web address] URL: [email or web address]  Date: 05/20/2023 Prepared by: Arlester Bence  Exercises - Seated Ankle Pumps on Table  - 1 x daily - 7 x weekly - 1 sets - 10-20 reps - Seated Ankle Circles  - 1 x daily - 7 x weekly - 1 sets - 10-20 reps - Long Sitting Calf Stretch with Strap  - 1 x daily - 7 x weekly - 3 sets - 20-30 sec hold  Patient Education - Heat - Ice  ASSESSMENT:  CLINICAL IMPRESSION: Hartley is a 63 y.o. male who was seen today for physical therapy evaluation and treatment for BIL ankle pain with mobility deficits, consistent with BIL achilles tendonitis. He is demonstrating decreased ankle AROM BIL, diminished ankle DF at initial contact of gait cycle BIL and mild tenderness to palpation along achilles tendon insertion BIL . He has related pain and difficulty with standing and walking after laying down. He requires skilled PT services at this time to address relevant deficits and improve overall function.     OBJECTIVE IMPAIRMENTS: Abnormal gait, decreased activity tolerance, decreased ROM, and pain.   ACTIVITY LIMITATIONS: standing, squatting, and walking  PARTICIPATION LIMITATIONS: cleaning and occupation  PERSONAL FACTORS: Profession, Time since onset of injury/illness/exacerbation, and 3+ comorbidities: Relevant PMHx includes osteoarthritis, gout, HIV, Stage 3 CKD, HTN  are also affecting patient's functional outcome.   REHAB POTENTIAL: Fair    CLINICAL DECISION MAKING: Evolving/moderate complexity  EVALUATION COMPLEXITY: Moderate   GOALS: Goals reviewed with patient? Yes  SHORT TERM GOALS: Target date: 06/10/2023   Patient will be independent with initial home program at least 3-4 days/week .  Baseline: provided at eval  Goal status: INITIAL  2.  Patient will demonstrate improved R ankle DF AROM by at least 5 degrees in supine position.  Baseline: see objective measures Goal  status: INITIAL    LONG TERM GOALS: Target date: 07/01/2023    Patient will report improved overall functional ability with LEFS score of 75/80 or greater.  Baseline: 71 Goal status: INITIAL  2.   Patient will demonstrate at least 4+/5 ankle MMT scores bilateral for DF, inversion and eversion Baseline: limited by ROM measures Goal status: INITIAL  3.  Patient will report worst pain 5/10 or less with daily activities.  Baseline: 8/10 worst pain with daily activities  Goal status: INITIAL  4.  Patient will report minimal to no ankle/achilles pain when laying down.  Baseline: moderate-to-severe pain when laying down for any duration of time Goal status: INITIAL   PLAN:  PT FREQUENCY: 1-2x/week  PT DURATION: 6 weeks  PLANNED INTERVENTIONS: 97164- PT Re-evaluation, 97750- Physical Performance Testing, 97110-Therapeutic exercises, 97530- Therapeutic activity, 97112- Neuromuscular re-education, 97535- Self Care, 16109- Manual therapy, [id number]- Electrical stimulation (unattended), [phone]- Ionotophoresis 4mg /ml Dexamethasone , Patient/Family education, Taping, Dry Needling, Joint mobilization, Joint manipulation, Cryotherapy, and Moist heat  PLAN FOR NEXT SESSION: ankle ROM, toe/intrinsic ROM and strengthening, posterior chain stretches, manual therapy, taping, joint mob as needed, TPDN or ionto as indicated    Arlester Bence, PT, DPT  05/23/2023 11:29 AM    [employer] Authorization   Choose one: Rehabilitative  Standardized Assessment or Functional Outcome Tool: See Pain Assessment and LEFS  Score or Percent Disability: 71/80  Body Parts Treated (Select each separately):  Ankle. Overall deficits/functional limitations for body part selected: moderate   If treatment provided at initial evaluation, no treatment charged due to lack of authorization.

## 2023-05-30 ENCOUNTER — Ambulatory Visit

## 2023-05-30 DIAGNOSIS — M25572 Pain in left ankle and joints of left foot: Secondary | ICD-10-CM

## 2023-05-30 DIAGNOSIS — M25571 Pain in right ankle and joints of right foot: Secondary | ICD-10-CM | POA: Diagnosis not present

## 2023-05-30 DIAGNOSIS — M7661 Achilles tendinitis, right leg: Secondary | ICD-10-CM | POA: Diagnosis not present

## 2023-05-30 DIAGNOSIS — R262 Difficulty in walking, not elsewhere classified: Secondary | ICD-10-CM | POA: Diagnosis not present

## 2023-05-30 DIAGNOSIS — M7662 Achilles tendinitis, left leg: Secondary | ICD-10-CM | POA: Diagnosis not present

## 2023-05-30 NOTE — Therapy (Signed)
 OUTPATIENT PHYSICAL THERAPY LOWER EXTREMITY EVALUATION   Patient Name: Chad Little MRN: [id number] DOB:03-Aug-1960, 63 y.o., male Today's Date: 05/30/2023  END OF SESSION:  PT End of Session - 05/30/23 1043     Visit Number 2    Number of Visits 12    Authorization Type [employer]    PT Start Time 1045    PT Stop Time 1125    PT Time Calculation (min) 40 min    Activity Tolerance Patient tolerated treatment well    Behavior During Therapy WFL for tasks assessed/performed              Past Medical History:  Diagnosis Date   Arthritis    Enlarged prostate    Essential hypertension    GERD (gastroesophageal reflux disease)    Gout    diet controlled no recent flare ups   HIV infection (HCC)    Superficial vein thrombosis 08/08/2020   Past Surgical History:  Procedure Laterality Date   APPENDECTOMY  2005   open   Right elbow surgery  dec3 2020   in [place]   TRANSURETHRAL RESECTION OF PROSTATE N/A 02/14/2020   Procedure: TRANSURETHRAL RESECTION OF THE PROSTATE (TURP), BIPOLAR;  Surgeon: Lahoma Pigg, MD;  Location: [facility];  Service: Urology;  Laterality: N/A;   Patient Active Problem List   Diagnosis Date Noted   Abdominal bloating 04/18/2023   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   Chronic kidney disease, stage 3a (HCC) 03/26/2023   Venous stasis dermatitis of left lower extremity 03/12/2023   Chronic idiopathic constipation 03/12/2023   Bacterial sinusitis 11/07/2022   Tinea corporis 08/22/2022   Rash 07/02/2022   Seborrheic dermatitis 12/21/2021   Blurry vision, left eye 09/20/2021   Erectile dysfunction 08/15/2021   Varicose veins of left lower extremity 08/10/2021   Arthritis of carpometacarpal (CMC) joint of left thumb 07/13/2021   Nail dystrophy 06/08/2021   Thoracic back pain 05/02/2021   BPPV (benign paroxysmal positional vertigo), bilateral 10/23/2020   Superficial vein thrombosis 08/08/2020   Seasonal  allergies 07/06/2020   GERD (gastroesophageal reflux disease) 06/13/2020   S/P TURP 03/22/2020   Healthcare maintenance 05/25/2019   Prediabetes 04/14/2019   Episodic tension-type headache, not intractable 09/30/2015   Hypertension 10/31/2014   Onychomycosis 12/11/2011   HIV disease (HCC) 12/28/2007   Osteoarthritis 12/28/2007    PCP: Ronni Colace, DO  REFERRING PROVIDER: Sherol Dixie, MD  REFERRING DIAG:  [phone] (ICD-10-CM) - Insertional tendinopathy of both Achilles tendons    THERAPY DIAG:  Pain in left ankle and joints of left foot  Pain in right ankle and joints of right foot  Difficulty in walking, not elsewhere classified  Rationale for Evaluation and Treatment: Rehabilitation  ONSET DATE: "8 months ago"  SUBJECTIVE:   SUBJECTIVE STATEMENT: Patient reports that he has been compliant with HEP and is noticing significant improvement in his symptoms. He does report increased pain after standing on ladder with his work activities.   EVAL: Patient reports to PT with BIL heel pain that has been present for about 8 months. He does not recall any injury. He states that he has to stand for a minute before he can start walking in the morning, unless he puts on his slides. I still feel the pain with the shoes on, but its not as severe. He stays that the pain only bothers him after laying down, "it could be 1 minute or 1 hour."   PERTINENT HISTORY: Relevant PMHx includes  osteoarthritis, gout, HIV, Stage 3 CKD, HTN  PAIN:  Are you having pain?  Yes: NPRS scale: 8/10 Pain location: BIL heel Pain description: sharp Aggravating factors: standing after laying down, crossing ankle while laying down  Relieving factors: putting on shoes  PRECAUTIONS: None  RED FLAGS: None   WEIGHT BEARING RESTRICTIONS: No  FALLS:  Has patient fallen in last 6 months? No  LIVING ENVIRONMENT: Lives with:  my sister Lives in: House/apartment Stairs: No Has following  equipment at home: None  OCCUPATION: home improvement   PLOF: Independent  PATIENT GOALS: "To see why this is happening, and see what I can do about it"   NEXT MD VISIT: none scheduled with referring provider at time of eval   OBJECTIVE:  Note: Objective measures were completed at Evaluation unless otherwise noted.  DIAGNOSTIC FINDINGS:   no relevant imaging results available in epic; none included in referral   PATIENT SURVEYS:  LEFS 71/80  COGNITION: Overall cognitive status: Within functional limits for tasks assessed      PALPATION: Mild tenderness to palpation around achilles tendon insertion BIL   LOWER EXTREMITY ROM:  Active ROM Right eval Left eval  Hip flexion    Hip extension    Hip abduction    Hip adduction    Hip internal rotation    Hip external rotation    Knee flexion    Knee extension    Ankle dorsiflexion 5 20  Ankle plantarflexion 30 45  Ankle inversion 25 20  Ankle eversion 5 2   (Blank rows = not tested)  LOWER EXTREMITY MMT:  MMT Right eval Left eval  Hip flexion    Hip extension    Hip abduction    Hip adduction    Hip internal rotation    Hip external rotation    Knee flexion    Knee extension    Ankle dorsiflexion    Ankle plantarflexion    Ankle inversion    Ankle eversion     (Blank rows = not tested)  GAIT: Distance walked: 50 ft Assistive device utilized: None Level of assistance: Complete Independence Comments: decreased ankle DF at initial contact BIL                                                                                                                                 TREATMENT DATE:    [facility] Adult PT Treatment:                                                DATE: 05/30/2023  Therapeutic Exercise: BIL Ankle Pumps x 20  Ankle Alphabet x 1 each  Ankle Inversion/Eversion towel slides x 20 each  BAPS DF/PF x 10 each Supination/pronation x 10 Circles x5 cw/ccw Towel Scrunches x 1 round pulling  towel  Standing Heel Toe raises, 2 x 10  Calf Stretch 2 x 30 sec  Reviewed HEP and updated     [facility] Adult PT Treatment:                                                DATE: 05/20/2023   Initial evaluation: see patient education and home exercise program as noted below      PATIENT EDUCATION:  Education details: reviewed initial home exercise program; discussion of POC, prognosis and goals for skilled PT   Person educated: Patient Education method: Explanation, Demonstration, and Handouts Education comprehension: verbalized understanding, returned demonstration, and needs further education  HOME EXERCISE PROGRAM: Access Code: [email or web address] URL: [email or web address] Date: 05/20/2023 Prepared by: Arlester Bence  Exercises - Seated Ankle Pumps on Table  - 1 x daily - 7 x weekly - 1 sets - 10-20 reps - Seated Ankle Circles  - 1 x daily - 7 x weekly - 1 sets - 10-20 reps - Long Sitting Calf Stretch with Strap  - 1 x daily - 7 x weekly - 3 sets - 20-30 sec hold  Patient Education - Heat - Ice  ASSESSMENT:  CLINICAL IMPRESSION:  05/30/2023 Chad Little had good tolerance of initial treatment session. Tactile and Verbal cues required for instruction. He will benefit from progression of weight bearing activities and isometric to eccentric calf training. Patient requires ongoing skilled PT intervention to address current impairments and related functional deficits. We will continue to progress as tolerated.     EVAL: Chad Little is a 63 y.o. male who was seen today for physical therapy evaluation and treatment for BIL ankle pain with mobility deficits, consistent with BIL achilles tendonitis. He is demonstrating decreased ankle AROM BIL, diminished ankle DF at initial contact of gait cycle BIL and mild tenderness to palpation along achilles tendon insertion BIL . He has related pain and difficulty with standing and walking after laying down. He requires skilled PT services at  this time to address relevant deficits and improve overall function.     OBJECTIVE IMPAIRMENTS: Abnormal gait, decreased activity tolerance, decreased ROM, and pain.   ACTIVITY LIMITATIONS: standing, squatting, and walking  PARTICIPATION LIMITATIONS: cleaning and occupation  PERSONAL FACTORS: Profession, Time since onset of injury/illness/exacerbation, and 3+ comorbidities: Relevant PMHx includes osteoarthritis, gout, HIV, Stage 3 CKD, HTN  are also affecting patient's functional outcome.   REHAB POTENTIAL: Fair    CLINICAL DECISION MAKING: Evolving/moderate complexity  EVALUATION COMPLEXITY: Moderate   GOALS: Goals reviewed with patient? Yes  SHORT TERM GOALS: Target date: 06/10/2023   Patient will be independent with initial home program at least 3-4 days/week .  Baseline: provided at eval  Goal status: INITIAL  2.  Patient will demonstrate improved R ankle DF AROM by at least 5 degrees in supine position.  Baseline: see objective measures Goal status: INITIAL    LONG TERM GOALS: Target date: 07/01/2023    Patient will report improved overall functional ability with LEFS score of 75/80 or greater.  Baseline: 71 Goal status: INITIAL  2.   Patient will demonstrate at least 4+/5 ankle MMT scores bilateral for DF, inversion and eversion Baseline: limited by ROM measures Goal status: INITIAL  3.  Patient will report worst pain 5/10 or less with daily activities.  Baseline: 8/10 worst pain with daily activities  Goal status: INITIAL  4.  Patient will report minimal to no ankle/achilles pain when laying down.  Baseline: moderate-to-severe pain when laying down for any duration of time Goal status: INITIAL   PLAN:  PT FREQUENCY: 1-2x/week  PT DURATION: 6 weeks  PLANNED INTERVENTIONS: 97164- PT Re-evaluation, 97750- Physical Performance Testing, 97110-Therapeutic exercises, 97530- Therapeutic activity, 97112- Neuromuscular re-education, 97535- Self Care, 19147-  Manual therapy, G0283- Electrical stimulation (unattended), [phone]- Ionotophoresis 4mg /ml Dexamethasone , Patient/Family education, Taping, Dry Needling, Joint mobilization, Joint manipulation, Cryotherapy, and Moist heat  PLAN FOR NEXT SESSION: ankle ROM, toe/intrinsic ROM and strengthening, posterior chain stretches, manual therapy, taping, joint mob as needed, TPDN or ionto as indicated    Arlester Bence, PT, DPT  05/30/2023 11:53 AM     [employer] Authorization   Choose one: Rehabilitative  Standardized Assessment or Functional Outcome Tool: See Pain Assessment and LEFS  Score or Percent Disability: 71/80  Body Parts Treated (Select each separately):  Ankle. Overall deficits/functional limitations for body part selected: moderate   If treatment provided at initial evaluation, no treatment charged due to lack of authorization.

## 2023-06-03 NOTE — Progress Notes (Unsigned)
 The ASCVD Risk score (Arnett DK, et al., 2019) failed to calculate for the following reasons:   The valid total cholesterol range is 130 to 320 mg/dL  Arlon Bergamo, BSN, RN

## 2023-06-04 ENCOUNTER — Ambulatory Visit: Admitting: Physical Therapy

## 2023-06-04 ENCOUNTER — Encounter: Payer: Self-pay | Admitting: Physical Therapy

## 2023-06-04 DIAGNOSIS — R262 Difficulty in walking, not elsewhere classified: Secondary | ICD-10-CM

## 2023-06-04 DIAGNOSIS — M25571 Pain in right ankle and joints of right foot: Secondary | ICD-10-CM

## 2023-06-04 DIAGNOSIS — M7662 Achilles tendinitis, left leg: Secondary | ICD-10-CM | POA: Diagnosis not present

## 2023-06-04 DIAGNOSIS — M25572 Pain in left ankle and joints of left foot: Secondary | ICD-10-CM | POA: Diagnosis not present

## 2023-06-04 DIAGNOSIS — M7661 Achilles tendinitis, right leg: Secondary | ICD-10-CM | POA: Diagnosis not present

## 2023-06-04 NOTE — Therapy (Signed)
 OUTPATIENT PHYSICAL THERAPY TREATMENT   Patient Name: Chad Little MRN: [id number] DOB:[birth year], 63 y.o., male Today's Date: 06/04/2023  END OF SESSION:  PT End of Session - 06/04/23 0957     Visit Number 3    Number of Visits 12    Date for PT Re-Evaluation 07/01/23    Authorization Type [facility] [facility]    Authorization Time Period 8 PT VISITS 05/20/23-07/19/23    Authorization - Visit Number 2    Authorization - Number of Visits 8    PT Start Time 0958    PT Stop Time 1041    PT Time Calculation (min) 43 min    Activity Tolerance Patient tolerated treatment well               Past Medical History:  Diagnosis Date   Arthritis    Enlarged prostate    Essential hypertension    GERD (gastroesophageal reflux disease)    Gout    diet controlled no recent flare ups   HIV infection (HCC)    Superficial vein thrombosis 08/08/2020   Past Surgical History:  Procedure Laterality Date   APPENDECTOMY  2005   open   Right elbow surgery  dec3 2020   in [place]   TRANSURETHRAL RESECTION OF PROSTATE N/A 02/14/2020   Procedure: TRANSURETHRAL RESECTION OF THE PROSTATE (TURP), BIPOLAR;  Surgeon: Lahoma Pigg, MD;  Location: [facility];  Service: Urology;  Laterality: N/A;   Patient Active Problem List   Diagnosis Date Noted   Abdominal bloating 04/18/2023   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   Chronic kidney disease, stage 3a (HCC) 03/26/2023   Venous stasis dermatitis of left lower extremity 03/12/2023   Chronic idiopathic constipation 03/12/2023   Bacterial sinusitis 11/07/2022   Tinea corporis 08/22/2022   Rash 07/02/2022   Seborrheic dermatitis 12/21/2021   Blurry vision, left eye 09/20/2021   Erectile dysfunction 08/15/2021   Varicose veins of left lower extremity 08/10/2021   Arthritis of carpometacarpal (CMC) joint of left thumb 07/13/2021   Nail dystrophy 06/08/2021   Thoracic back pain 05/02/2021   BPPV (benign paroxysmal  positional vertigo), bilateral 10/23/2020   Superficial vein thrombosis 08/08/2020   Seasonal allergies 07/06/2020   GERD (gastroesophageal reflux disease) 06/13/2020   S/P TURP 03/22/2020   Healthcare maintenance 05/25/2019   Prediabetes 04/14/2019   Episodic tension-type headache, not intractable 09/30/2015   Hypertension 10/31/2014   Onychomycosis 12/11/2011   HIV disease (HCC) 12/28/2007   Osteoarthritis 12/28/2007    PCP: Ronni Colace, DO  REFERRING PROVIDER: Sherol Dixie, MD  REFERRING DIAG:  [phone] (ICD-10-CM) - Insertional tendinopathy of both Achilles tendons    THERAPY DIAG:  Pain in left ankle and joints of left foot  Pain in right ankle and joints of right foot  Difficulty in walking, not elsewhere classified  Rationale for Evaluation and Treatment: Rehabilitation  ONSET DATE: "8 months ago"  SUBJECTIVE:   SUBJECTIVE STATEMENT: 06/04/2023 Pt states he has been doing pretty good. States that he feels like his pain may be related to sleep positioning. Had a rough episode Sunday, usually symptoms improve with movement, but not so on that day. Back to baseline today, no pain at present.   EVAL: Patient reports to PT with BIL heel pain that has been present for about 8 months. He does not recall any injury. He states that he has to stand for a minute before he can start walking in the morning, unless he puts on his slides.  I still feel the pain with the shoes on, but its not as severe. He stays that the pain only bothers him after laying down, "it could be 1 minute or 1 hour."   PERTINENT HISTORY: Relevant PMHx includes osteoarthritis, gout, HIV, Stage 3 CKD, HTN  PAIN:  Are you having pain? See subjective above 06/04/2023   Per eval -  Yes: NPRS scale: 8/10 Pain location: BIL heel Pain description: sharp Aggravating factors: standing after laying down, crossing ankle while laying down  Relieving factors: putting on shoes  PRECAUTIONS:  None  RED FLAGS: None   WEIGHT BEARING RESTRICTIONS: No  FALLS:  Has patient fallen in last 6 months? No  LIVING ENVIRONMENT: Lives with:  my sister Lives in: House/apartment Stairs: No Has following equipment at home: None  OCCUPATION: home improvement   PLOF: Independent  PATIENT GOALS: "To see why this is happening, and see what I can do about it"   NEXT MD VISIT: none scheduled with referring provider at time of eval   OBJECTIVE:  Note: Objective measures were completed at Evaluation unless otherwise noted.  DIAGNOSTIC FINDINGS:   no relevant imaging results available in epic; none included in referral   PATIENT SURVEYS:  LEFS 71/80  COGNITION: Overall cognitive status: Within functional limits for tasks assessed      PALPATION: Mild tenderness to palpation around achilles tendon insertion BIL   LOWER EXTREMITY ROM:  Active ROM Right eval Left eval  Hip flexion    Hip extension    Hip abduction    Hip adduction    Hip internal rotation    Hip external rotation    Knee flexion    Knee extension    Ankle dorsiflexion 5 20  Ankle plantarflexion 30 45  Ankle inversion 25 20  Ankle eversion 5 2   (Blank rows = not tested)  LOWER EXTREMITY MMT:  MMT Right eval Left eval  Hip flexion    Hip extension    Hip abduction    Hip adduction    Hip internal rotation    Hip external rotation    Knee flexion    Knee extension    Ankle dorsiflexion    Ankle plantarflexion    Ankle inversion    Ankle eversion     (Blank rows = not tested)  GAIT: Distance walked: 50 ft Assistive device utilized: None Level of assistance: Complete Independence Comments: decreased ankle DF at initial contact BIL                                                                                                                                 TREATMENT DATE:  [facility] Adult PT Treatment:  DATE: 06/04/23 Therapeutic  Exercise: Ankle alphabets x1 BIL Towel scrunches 3x10 Green band PF x20 BIL cues for full/comfortable ROM Green band inversion 2x8 BIL cues for setup, education on home positioning Green band eversion 2x8 BIL cues for setup Hallux ext AROM 2x55min HEP education/update + handout  Self Care: Education on symptom modification strategies, sleep positioning/hygiene, relevant anatomy/physiology and rationale for interventions   [facility] Adult PT Treatment:                                                DATE: 05/30/2023  Therapeutic Exercise: BIL Ankle Pumps x 20  Ankle Alphabet x 1 each  Ankle Inversion/Eversion towel slides x 20 each  BAPS DF/PF x 10 each Supination/pronation x 10 Circles x5 cw/ccw Towel Scrunches x 1 round pulling towel Standing Heel Toe raises, 2 x 10  Calf Stretch 2 x 30 sec  Reviewed HEP and updated     [facility] Adult PT Treatment:                                                DATE: 05/20/2023   Initial evaluation: see patient education and home exercise program as noted below      PATIENT EDUCATION:  Education details: rationale for interventions, HEP  Person educated: Patient Education method: Explanation, Demonstration, Tactile cues, Verbal cues Education comprehension: verbalized understanding, returned demonstration, verbal cues required, tactile cues required, and needs further education     HOME EXERCISE PROGRAM: Access Code: [email or web address] URL: [email or web address] Date: 06/04/2023 Prepared by: Mayme Spearman  Exercises - Seated Ankle Circles  - 1 x daily - 7 x weekly - 1 sets - 10-20 reps - Long Sitting Calf Stretch with Strap  - 1 x daily - 7 x weekly - 3 sets - 20-30 sec hold - Towel Scrunches  - 1 x daily - 3 x weekly - 2-3 sets - 10 reps - 3 sec hold - Seated Ankle Alphabet  - 1 x daily - 3 x weekly - 2-3 sets - 10 reps - 3 sec hold - Heel Toe Raises with Counter Support  - 1 x daily - 3 x weekly - 2-3 sets - 10 reps - 3 sec hold -  Seated Ankle Plantarflexion with Resistance  - 3 x weekly - 2-3 sets - 15 reps - Seated Ankle Inversion with Resistance and Legs Crossed  - 3 x weekly - 2-3 sets - 8 reps - Seated Ankle Eversion with Resistance  - 3 x weekly - 2-3 sets - 8 reps  ASSESSMENT:  CLINICAL IMPRESSION:  06/04/2023 Pt arrives w/o pain, reports steady progress overall but does endorse exacerbation Sunday. He states that he feels sleep positioning is contributing, so we spend time w/ education re: sleep positioning and appropriate symptom modification strategies. He is engaged and receptive with this discussion. Continuing to progress foot intrinsic/extrinsic and ankle strengthening today with increased time spent with resistance training. Tolerates well without pain, no adverse events, cues as above. Recommend continuing along current POC in order to address relevant deficits and improve functional tolerance. Pt departs today's session in no acute distress, all voiced questions/concerns addressed appropriately from PT perspective.      EVAL: Britan is a 63 y.o.  male who was seen today for physical therapy evaluation and treatment for BIL ankle pain with mobility deficits, consistent with BIL achilles tendonitis. He is demonstrating decreased ankle AROM BIL, diminished ankle DF at initial contact of gait cycle BIL and mild tenderness to palpation along achilles tendon insertion BIL . He has related pain and difficulty with standing and walking after laying down. He requires skilled PT services at this time to address relevant deficits and improve overall function.     OBJECTIVE IMPAIRMENTS: Abnormal gait, decreased activity tolerance, decreased ROM, and pain.   ACTIVITY LIMITATIONS: standing, squatting, and walking  PARTICIPATION LIMITATIONS: cleaning and occupation  PERSONAL FACTORS: Profession, Time since onset of injury/illness/exacerbation, and 3+ comorbidities: Relevant PMHx includes osteoarthritis, gout, HIV,  Stage 3 CKD, HTN  are also affecting patient's functional outcome.   REHAB POTENTIAL: Fair    CLINICAL DECISION MAKING: Evolving/moderate complexity  EVALUATION COMPLEXITY: Moderate   GOALS: Goals reviewed with patient? Yes  SHORT TERM GOALS: Target date: 06/10/2023   Patient will be independent with initial home program at least 3-4 days/week .  Baseline: provided at eval  Goal status: INITIAL  2.  Patient will demonstrate improved R ankle DF AROM by at least 5 degrees in supine position.  Baseline: see objective measures Goal status: INITIAL    LONG TERM GOALS: Target date: 07/01/2023    Patient will report improved overall functional ability with LEFS score of 75/80 or greater.  Baseline: 71 Goal status: INITIAL  2.   Patient will demonstrate at least 4+/5 ankle MMT scores bilateral for DF, inversion and eversion Baseline: limited by ROM measures Goal status: INITIAL  3.  Patient will report worst pain 5/10 or less with daily activities.  Baseline: 8/10 worst pain with daily activities  Goal status: INITIAL  4.  Patient will report minimal to no ankle/achilles pain when laying down.  Baseline: moderate-to-severe pain when laying down for any duration of time Goal status: INITIAL   PLAN:  PT FREQUENCY: 1-2x/week  PT DURATION: 6 weeks  PLANNED INTERVENTIONS: 40981- PT Re-evaluation, 97750- Physical Performance Testing, 97110-Therapeutic exercises, 97530- Therapeutic activity, 97112- Neuromuscular re-education, 97535- Self Care, 19147- Manual therapy, G0283- Electrical stimulation (unattended), [phone]- Ionotophoresis 4mg /ml Dexamethasone , Patient/Family education, Taping, Dry Needling, Joint mobilization, Joint manipulation, Cryotherapy, and Moist heat  PLAN FOR NEXT SESSION: ankle ROM, toe/intrinsic ROM and strengthening, posterior chain stretches, manual therapy, taping, joint mob as needed, TPDN or ionto as indicated    Lovett Ruck PT, DPT 06/04/2023 10:42  AM      [employer] Authorization   Choose one: Rehabilitative  Standardized Assessment or Functional Outcome Tool: See Pain Assessment and LEFS  Score or Percent Disability: 71/80  Body Parts Treated (Select each separately):  Ankle. Overall deficits/functional limitations for body part selected: moderate   If treatment provided at initial evaluation, no treatment charged due to lack of authorization.

## 2023-06-10 ENCOUNTER — Other Ambulatory Visit: Payer: Self-pay | Admitting: Student

## 2023-06-10 DIAGNOSIS — I10 Essential (primary) hypertension: Secondary | ICD-10-CM

## 2023-06-10 NOTE — Telephone Encounter (Signed)
 Medication sent to pharmacy

## 2023-06-11 ENCOUNTER — Ambulatory Visit: Attending: Internal Medicine

## 2023-06-11 DIAGNOSIS — R262 Difficulty in walking, not elsewhere classified: Secondary | ICD-10-CM | POA: Insufficient documentation

## 2023-06-11 DIAGNOSIS — M25571 Pain in right ankle and joints of right foot: Secondary | ICD-10-CM | POA: Diagnosis present

## 2023-06-11 DIAGNOSIS — M25572 Pain in left ankle and joints of left foot: Secondary | ICD-10-CM | POA: Insufficient documentation

## 2023-06-11 NOTE — Therapy (Signed)
 OUTPATIENT PHYSICAL THERAPY TREATMENT   Patient Name: Chad Little MRN: [id number] DOB:November 09, 1960, 63 y.o., male Today's Date: 06/11/2023  END OF SESSION:  PT End of Session - 06/11/23 1006     Visit Number 4    Number of Visits 12    Date for PT Re-Evaluation 07/01/23    Authorization Type MCD [facility]    Authorization Time Period 8 PT VISITS 05/20/23-07/19/23    Authorization - Visit Number 3    Authorization - Number of Visits 8    PT Start Time 1005    PT Stop Time 1043    PT Time Calculation (min) 38 min    Activity Tolerance Patient tolerated treatment well    Behavior During Therapy WFL for tasks assessed/performed                Past Medical History:  Diagnosis Date   Arthritis    Enlarged prostate    Essential hypertension    GERD (gastroesophageal reflux disease)    Gout    diet controlled no recent flare ups   HIV infection (HCC)    Superficial vein thrombosis 08/08/2020   Past Surgical History:  Procedure Laterality Date   APPENDECTOMY  2005   open   Right elbow surgery  dec3 2020   in [place]   TRANSURETHRAL RESECTION OF PROSTATE N/A 02/14/2020   Procedure: TRANSURETHRAL RESECTION OF THE PROSTATE (TURP), BIPOLAR;  Surgeon: Lahoma Pigg, MD;  Location: [facility];  Service: Urology;  Laterality: N/A;   Patient Active Problem List   Diagnosis Date Noted   Abdominal bloating 04/18/2023   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   Chronic kidney disease, stage 3a (HCC) 03/26/2023   Venous stasis dermatitis of left lower extremity 03/12/2023   Chronic idiopathic constipation 03/12/2023   Bacterial sinusitis 11/07/2022   Tinea corporis 08/22/2022   Rash 07/02/2022   Seborrheic dermatitis 12/21/2021   Blurry vision, left eye 09/20/2021   Erectile dysfunction 08/15/2021   Varicose veins of left lower extremity 08/10/2021   Arthritis of carpometacarpal (CMC) joint of left thumb 07/13/2021   Nail dystrophy 06/08/2021    Thoracic back pain 05/02/2021   BPPV (benign paroxysmal positional vertigo), bilateral 10/23/2020   Superficial vein thrombosis 08/08/2020   Seasonal allergies 07/06/2020   GERD (gastroesophageal reflux disease) 06/13/2020   S/P TURP 03/22/2020   Healthcare maintenance 05/25/2019   Prediabetes 04/14/2019   Episodic tension-type headache, not intractable 09/30/2015   Hypertension 10/31/2014   Onychomycosis 12/11/2011   HIV disease (HCC) 12/28/2007   Osteoarthritis 12/28/2007    PCP: Ronni Colace, DO  REFERRING PROVIDER: Sherol Dixie, MD  REFERRING DIAG:  [phone] (ICD-10-CM) - Insertional tendinopathy of both Achilles tendons    THERAPY DIAG:  Pain in left ankle and joints of left foot  Pain in right ankle and joints of right foot  Difficulty in walking, not elsewhere classified  Rationale for Evaluation and Treatment: Rehabilitation  ONSET DATE: "8 months ago"  SUBJECTIVE:   SUBJECTIVE STATEMENT: 06/11/2023 Patient states that he had a lot of pain on Sunday, which has improved to 2/10 today.   EVAL: Patient reports to PT with BIL heel pain that has been present for about 8 months. He does not recall any injury. He states that he has to stand for a minute before he can start walking in the morning, unless he puts on his slides. I still feel the pain with the shoes on, but its not as severe. He stays that  the pain only bothers him after laying down, "it could be 1 minute or 1 hour."   PERTINENT HISTORY: Relevant PMHx includes osteoarthritis, gout, HIV, Stage 3 CKD, HTN  PAIN:  Are you having pain? See subjective above 06/11/2023   Per eval -  Yes: NPRS scale: 8/10 Pain location: BIL heel Pain description: sharp Aggravating factors: standing after laying down, crossing ankle while laying down  Relieving factors: putting on shoes  PRECAUTIONS: None  RED FLAGS: None   WEIGHT BEARING RESTRICTIONS: No  FALLS:  Has patient fallen in last 6 months?  No  LIVING ENVIRONMENT: Lives with:  my sister Lives in: House/apartment Stairs: No Has following equipment at home: None  OCCUPATION: home improvement   PLOF: Independent  PATIENT GOALS: "To see why this is happening, and see what I can do about it"   NEXT MD VISIT: none scheduled with referring provider at time of eval   OBJECTIVE:  Note: Objective measures were completed at Evaluation unless otherwise noted.  DIAGNOSTIC FINDINGS:   no relevant imaging results available in epic; none included in referral   PATIENT SURVEYS:  LEFS 71/80  COGNITION: Overall cognitive status: Within functional limits for tasks assessed      PALPATION: Mild tenderness to palpation around achilles tendon insertion BIL   LOWER EXTREMITY ROM:  Active ROM Right eval Left eval  Hip flexion    Hip extension    Hip abduction    Hip adduction    Hip internal rotation    Hip external rotation    Knee flexion    Knee extension    Ankle dorsiflexion 5 20  Ankle plantarflexion 30 45  Ankle inversion 25 20  Ankle eversion 5 2   (Blank rows = not tested)  LOWER EXTREMITY MMT:  MMT Right eval Left eval  Hip flexion    Hip extension    Hip abduction    Hip adduction    Hip internal rotation    Hip external rotation    Knee flexion    Knee extension    Ankle dorsiflexion    Ankle plantarflexion    Ankle inversion    Ankle eversion     (Blank rows = not tested)  GAIT: Distance walked: 50 ft Assistive device utilized: None Level of assistance: Complete Independence Comments: decreased ankle DF at initial contact BIL                                                                                                                                 TREATMENT DATE:   [facility] Adult PT Treatment:                                                DATE: 06/11/2023  Therapeutic Exercise: Ankle alphabets x1 BIL Blue band PF 2  x 10 BIL  Blue band inversion 2x10 BIL  Blue band eversion 2x10  BIL  Towel scrunches and pull x 1 round  BIL  Marble pick ups (1/2 cup) x 1 round each (8 minutes total, unable to complete d/t decreased mobility)  Hallux ext AROM 2 x 1 min HEP review    [facility] Adult PT Treatment:                                                DATE: 06/04/23 Therapeutic Exercise: Ankle alphabets x1 BIL Towel scrunches 3x10 Green band PF x20 BIL cues for full/comfortable ROM Green band inversion 2x8 BIL cues for setup, education on home positioning Green band eversion 2x8 BIL cues for setup Hallux ext AROM 2x67min HEP education/update + handout  Self Care: Education on symptom modification strategies, sleep positioning/hygiene, relevant anatomy/physiology and rationale for interventions   [facility] Adult PT Treatment:                                                DATE: 05/30/2023  Therapeutic Exercise: BIL Ankle Pumps x 20  Ankle Alphabet x 1 each  Ankle Inversion/Eversion towel slides x 20 each  BAPS DF/PF x 10 each Supination/pronation x 10 Circles x5 cw/ccw Towel Scrunches x 1 round pulling towel Standing Heel Toe raises, 2 x 10  Calf Stretch 2 x 30 sec  Reviewed HEP and updated        PATIENT EDUCATION:  Education details: rationale for interventions, HEP  Person educated: Patient Education method: Explanation, Demonstration, Tactile cues, Verbal cues Education comprehension: verbalized understanding, returned demonstration, verbal cues required, tactile cues required, and needs further education     HOME EXERCISE PROGRAM: Access Code: [email or web address] URL: [email or web address] Date: 06/04/2023 Prepared by: Mayme Spearman  Exercises - Seated Ankle Circles  - 1 x daily - 7 x weekly - 1 sets - 10-20 reps - Long Sitting Calf Stretch with Strap  - 1 x daily - 7 x weekly - 3 sets - 20-30 sec hold - Towel Scrunches  - 1 x daily - 3 x weekly - 2-3 sets - 10 reps - 3 sec hold - Seated Ankle Alphabet  - 1 x daily - 3 x weekly - 2-3 sets - 10 reps - 3  sec hold - Heel Toe Raises with Counter Support  - 1 x daily - 3 x weekly - 2-3 sets - 10 reps - 3 sec hold - Seated Ankle Plantarflexion with Resistance  - 3 x weekly - 2-3 sets - 15 reps - Seated Ankle Inversion with Resistance and Legs Crossed  - 3 x weekly - 2-3 sets - 8 reps - Seated Ankle Eversion with Resistance  - 3 x weekly - 2-3 sets - 8 reps  ASSESSMENT:  CLINICAL IMPRESSION:  06/11/2023 Veryl Gottron had good tolerance of today's treatment session, including increased resistance and greater challenge with intrinsic mm strengthening.  We will continue to progress per POC as tolerated, including increased time with weight bearing activities at next visit.     EVAL: Aldan is a 63 y.o. male who was seen today for physical therapy evaluation and treatment for BIL ankle pain with mobility deficits, consistent with BIL achilles tendonitis. He is  demonstrating decreased ankle AROM BIL, diminished ankle DF at initial contact of gait cycle BIL and mild tenderness to palpation along achilles tendon insertion BIL . He has related pain and difficulty with standing and walking after laying down. He requires skilled PT services at this time to address relevant deficits and improve overall function.     OBJECTIVE IMPAIRMENTS: Abnormal gait, decreased activity tolerance, decreased ROM, and pain.   ACTIVITY LIMITATIONS: standing, squatting, and walking  PARTICIPATION LIMITATIONS: cleaning and occupation  PERSONAL FACTORS: Profession, Time since onset of injury/illness/exacerbation, and 3+ comorbidities: Relevant PMHx includes osteoarthritis, gout, HIV, Stage 3 CKD, HTN  are also affecting patient's functional outcome.   REHAB POTENTIAL: Fair    CLINICAL DECISION MAKING: Evolving/moderate complexity  EVALUATION COMPLEXITY: Moderate   GOALS: Goals reviewed with patient? Yes  SHORT TERM GOALS: Target date: 06/10/2023   Patient will be independent with initial home program at least 3-4  days/week .  Baseline: provided at eval  Goal status: INITIAL  2.  Patient will demonstrate improved R ankle DF AROM by at least 5 degrees in supine position.  Baseline: see objective measures Goal status: INITIAL    LONG TERM GOALS: Target date: 07/01/2023    Patient will report improved overall functional ability with LEFS score of 75/80 or greater.  Baseline: 71 Goal status: INITIAL  2.   Patient will demonstrate at least 4+/5 ankle MMT scores bilateral for DF, inversion and eversion Baseline: limited by ROM measures Goal status: INITIAL  3.  Patient will report worst pain 5/10 or less with daily activities.  Baseline: 8/10 worst pain with daily activities  Goal status: INITIAL  4.  Patient will report minimal to no ankle/achilles pain when laying down.  Baseline: moderate-to-severe pain when laying down for any duration of time Goal status: INITIAL   PLAN:  PT FREQUENCY: 1-2x/week  PT DURATION: 6 weeks  PLANNED INTERVENTIONS: 97164- PT Re-evaluation, 97750- Physical Performance Testing, 97110-Therapeutic exercises, 97530- Therapeutic activity, 97112- Neuromuscular re-education, 97535- Self Care, 19147- Manual therapy, G0283- Electrical stimulation (unattended), [phone]- Ionotophoresis 4mg /ml Dexamethasone , Patient/Family education, Taping, Dry Needling, Joint mobilization, Joint manipulation, Cryotherapy, and Moist heat  PLAN FOR NEXT SESSION: ankle ROM, toe/intrinsic ROM and strengthening, posterior chain stretches, manual therapy, taping, joint mob as needed, TPDN or ionto as indicated    Arlester Bence, PT, DPT  06/11/2023 10:48 AM      [employer] Authorization   Choose one: Rehabilitative  Standardized Assessment or Functional Outcome Tool: See Pain Assessment and LEFS  Score or Percent Disability: 71/80  Body Parts Treated (Select each separately):  Ankle. Overall deficits/functional limitations for body part selected: moderate   If treatment provided at  initial evaluation, no treatment charged due to lack of authorization.

## 2023-06-16 ENCOUNTER — Telehealth: Payer: Self-pay | Admitting: Student

## 2023-06-16 NOTE — Telephone Encounter (Signed)
 PLEASE schedule in [facility] clinic.

## 2023-06-16 NOTE — Telephone Encounter (Signed)
 Called patient to schedule Dermatology appointment, LVM to call back. Please assist in scheduling if patient calls back or let me know if patient calls back.   Appointment Notes: Ref by [facility] (Rash). sw    Thanks!

## 2023-06-18 ENCOUNTER — Ambulatory Visit

## 2023-07-01 ENCOUNTER — Ambulatory Visit

## 2023-07-01 DIAGNOSIS — R262 Difficulty in walking, not elsewhere classified: Secondary | ICD-10-CM

## 2023-07-01 DIAGNOSIS — M25572 Pain in left ankle and joints of left foot: Secondary | ICD-10-CM | POA: Diagnosis not present

## 2023-07-01 DIAGNOSIS — M25571 Pain in right ankle and joints of right foot: Secondary | ICD-10-CM

## 2023-07-01 NOTE — Therapy (Signed)
 OUTPATIENT PHYSICAL THERAPY NOTE    Patient Name: Chad Little MRN: [id number] DOB:May 02, 1960, 63 y.o., male Today's Date: 07/01/2023   PHYSICAL THERAPY DISCHARGE SUMMARY  Visits from Start of Care:    Current functional level related to goals / functional outcomes: See objective findings/assessment    Remaining deficits: See objective findings/assessment    Education / Equipment: See today's treatment/assessment      Patient agrees to discharge. Patient goals were met. Patient is being discharged due to meeting the stated rehab goals.     END OF SESSION:  PT End of Session - 07/01/23 0920     Visit Number 5    Number of Visits 12    Date for PT Re-Evaluation 07/01/23    Authorization Type MCD [facility]    Authorization Time Period 8 PT VISITS 05/20/23-07/19/23    Authorization - Visit Number 4    Authorization - Number of Visits 8    PT Start Time 0917    PT Stop Time 0950    PT Time Calculation (min) 33 min    Activity Tolerance Patient tolerated treatment well    Behavior During Therapy [facility] for tasks assessed/performed                 Past Medical History:  Diagnosis Date   Arthritis    Enlarged prostate    Essential hypertension    GERD (gastroesophageal reflux disease)    Gout    diet controlled no recent flare ups   HIV infection (HCC)    Superficial vein thrombosis 08/08/2020   Past Surgical History:  Procedure Laterality Date   APPENDECTOMY  2005   open   Right elbow surgery  dec3 2020   in [place]   TRANSURETHRAL RESECTION OF PROSTATE N/A 02/14/2020   Procedure: TRANSURETHRAL RESECTION OF THE PROSTATE (TURP), BIPOLAR;  Surgeon: Lahoma Pigg, MD;  Location: [facility];  Service: Urology;  Laterality: N/A;   Patient Active Problem List   Diagnosis Date Noted   Abdominal bloating 04/18/2023   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   Chronic kidney disease, stage 3a (HCC) 03/26/2023   Venous stasis  dermatitis of left lower extremity 03/12/2023   Chronic idiopathic constipation 03/12/2023   Bacterial sinusitis 11/07/2022   Tinea corporis 08/22/2022   Rash 07/02/2022   Seborrheic dermatitis 12/21/2021   Blurry vision, left eye 09/20/2021   Erectile dysfunction 08/15/2021   Varicose veins of left lower extremity 08/10/2021   Arthritis of carpometacarpal (CMC) joint of left thumb 07/13/2021   Nail dystrophy 06/08/2021   Thoracic back pain 05/02/2021   BPPV (benign paroxysmal positional vertigo), bilateral 10/23/2020   Superficial vein thrombosis 08/08/2020   Seasonal allergies 07/06/2020   GERD (gastroesophageal reflux disease) 06/13/2020   S/P TURP 03/22/2020   Healthcare maintenance 05/25/2019   Prediabetes 04/14/2019   Episodic tension-type headache, not intractable 09/30/2015   Hypertension 10/31/2014   Onychomycosis 12/11/2011   HIV disease (HCC) 12/28/2007   Osteoarthritis 12/28/2007    PCP: Ronni Colace, DO  REFERRING PROVIDER: Sherol Dixie, MD  REFERRING DIAG:  [phone] (ICD-10-CM) - Insertional tendinopathy of both Achilles tendons    THERAPY DIAG:  Pain in left ankle and joints of left foot  Pain in right ankle and joints of right foot  Difficulty in walking, not elsewhere classified  Rationale for Evaluation and Treatment: Rehabilitation  ONSET DATE: "8 months ago"  SUBJECTIVE:   SUBJECTIVE STATEMENT: 07/01/2023 Patient reporting that he has significant improvement in his symptoms  at this time. He was unable to attend last visit d/t work, but has remained independent with HEP. He states "I've really only had the pain once while laying down at night, and I just adjusted my position." He agrees to discharged from PT today. He has 0/10 pain throughout today's visit.    PERTINENT HISTORY: Relevant PMHx includes osteoarthritis, gout, HIV, Stage 3 CKD, HTN  PAIN:  Are you having pain? See subjective above 07/01/2023  Per eval -  Yes: NPRS  scale: 8/10 Pain location: BIL heel Pain description: sharp Aggravating factors: standing after laying down, crossing ankle while laying down  Relieving factors: putting on shoes  PRECAUTIONS: None  RED FLAGS: None   WEIGHT BEARING RESTRICTIONS: No  FALLS:  Has patient fallen in last 6 months? No  LIVING ENVIRONMENT: Lives with: my sister Lives in: House/apartment Stairs: No Has following equipment at home: None  OCCUPATION: home improvement   PLOF: Independent  PATIENT GOALS: "To see why this is happening, and see what I can do about it"   NEXT MD VISIT: none scheduled with referring provider at time of eval   OBJECTIVE:  Note: Objective measures were completed at Evaluation unless otherwise noted.  DIAGNOSTIC FINDINGS:   no relevant imaging results available in epic; none included in referral   PATIENT SURVEYS:  LEFS 71/80  COGNITION: Overall cognitive status: Within functional limits for tasks assessed      PALPATION: Mild tenderness to palpation around achilles tendon insertion BIL   LOWER EXTREMITY ROM:  Active ROM Right eval Left eval Right 07/01/2023 Left 07/01/2023  Hip flexion      Hip extension      Hip abduction      Hip adduction      Hip internal rotation      Hip external rotation      Knee flexion      Knee extension      Ankle dorsiflexion 5 20 12 20   Ankle plantarflexion 30 45 35 45  Ankle inversion 25 20 30 25   Ankle eversion 5 2 5 8    (Blank rows = not tested)  LOWER EXTREMITY MMT:  MMT Right eval Left eval Right 07/01/2023 Left 07/01/2023  Hip flexion      Hip extension      Hip abduction      Hip adduction      Hip internal rotation      Hip external rotation      Knee flexion      Knee extension      Ankle dorsiflexion   5 5  Ankle plantarflexion   5 5  Ankle inversion   4+ 4+  Ankle eversion   4+ 4+   (Blank rows = not tested)  GAIT: Distance walked: 50 ft Assistive device utilized: None Level of  assistance: Complete Independence Comments: decreased ankle DF at initial contact BIL  TREATMENT DATE:   [facility] Adult PT Treatment:                                                DATE: 06/11/2023  Therapeutic Activity:  Reassessment of objective measures and subjective assessment regarding progress towards established goals and plan for independence with prescribed home program following discharge from PT     PATIENT EDUCATION:  Education details: rationale for interventions, HEP  Person educated: Patient Education method: Explanation, Demonstration, Tactile cues, Verbal cues Education comprehension: verbalized understanding, returned demonstration, verbal cues required, tactile cues required, and needs further education     HOME EXERCISE PROGRAM: Access Code: [email or web address] URL: [email or web address] Date: 06/04/2023 Prepared by: Mayme Spearman  Exercises - Seated Ankle Circles  - 1 x daily - 7 x weekly - 1 sets - 10-20 reps - Long Sitting Calf Stretch with Strap  - 1 x daily - 7 x weekly - 3 sets - 20-30 sec hold - Towel Scrunches  - 1 x daily - 3 x weekly - 2-3 sets - 10 reps - 3 sec hold - Seated Ankle Alphabet  - 1 x daily - 3 x weekly - 2-3 sets - 10 reps - 3 sec hold - Heel Toe Raises with Counter Support  - 1 x daily - 3 x weekly - 2-3 sets - 10 reps - 3 sec hold - Seated Ankle Plantarflexion with Resistance  - 3 x weekly - 2-3 sets - 15 reps - Seated Ankle Inversion with Resistance and Legs Crossed  - 3 x weekly - 2-3 sets - 8 reps - Seated Ankle Eversion with Resistance  - 3 x weekly - 2-3 sets - 8 reps  ASSESSMENT:  CLINICAL IMPRESSION:  07/01/2023 Chad Little is a 63 y.o. male has attended 4 visits since initial evaluation. Patient has made good progress with ankle ROM and strength, and have met all rehab goals. He continues to have  occasional pain at night, that is alleviated with position changes. He should continue with prescribed home program. Patient will be discharged from skilled PT at this time and should follow up with referring provider as needed.      OBJECTIVE IMPAIRMENTS: Abnormal gait, decreased activity tolerance, decreased ROM, and pain.   ACTIVITY LIMITATIONS: standing, squatting, and walking  PARTICIPATION LIMITATIONS: cleaning and occupation  PERSONAL FACTORS: Profession, Time since onset of injury/illness/exacerbation, and 3+ comorbidities: Relevant PMHx includes osteoarthritis, gout, HIV, Stage 3 CKD, HTN are also affecting patient's functional outcome.   REHAB POTENTIAL: Fair    CLINICAL DECISION MAKING: Evolving/moderate complexity  EVALUATION COMPLEXITY: Moderate   GOALS: Goals reviewed with patient? Yes  SHORT TERM GOALS: Target date: 06/10/2023   Patient will be independent with initial home program at least 3-4 days/week .  Baseline: provided at eval  Goal status: MET  2.  Patient will demonstrate improved R ankle DF AROM by at least 5 degrees in supine position.  Baseline: see objective measures Goal status:MET    LONG TERM GOALS: Target date: 07/01/2023    Patient will report improved overall functional ability with LEFS score of 75/80 or greater.  Baseline: 71 07/01/23: 70 Goal status: NOT MET  2.   Patient will demonstrate at least 4+/5 ankle MMT scores bilateral for DF, inversion and eversion Baseline: limited by ROM measures Goal status: MET  3.  Patient will report worst pain  5/10 or less with daily activities.  Baseline: 8/10 worst pain with daily activities  Goal status: MET  4.  Patient will report minimal to no ankle/achilles pain when laying down.  Baseline: moderate-to-severe pain when laying down for any duration of time Goal status: MET   PLAN:  PT FREQUENCY: 1-2x/week  PT DURATION: 6 weeks  PLANNED INTERVENTIONS: 91478- PT Re-evaluation,  97750- Physical Performance Testing, 97110-Therapeutic exercises, 97530- Therapeutic activity, [id number]- Neuromuscular re-education, 97535- Self Care, 29562- Manual therapy, G0283- Electrical stimulation (unattended), 97033- Ionotophoresis 4mg /ml Dexamethasone , Patient/Family education, Taping, Dry Needling, Joint mobilization, Joint manipulation, Cryotherapy, and Moist heat  PLAN FOR NEXT SESSION: discharged; continue with HEP and follow up with referring provider as needed    Arlester Bence, PT, DPT  07/01/2023 9:53 AM

## 2023-07-02 ENCOUNTER — Ambulatory Visit (INDEPENDENT_AMBULATORY_CARE_PROVIDER_SITE_OTHER): Payer: Medicaid Other | Admitting: Student

## 2023-07-02 ENCOUNTER — Other Ambulatory Visit: Payer: Self-pay

## 2023-07-02 ENCOUNTER — Other Ambulatory Visit (HOSPITAL_COMMUNITY): Payer: Self-pay

## 2023-07-02 ENCOUNTER — Ambulatory Visit: Admitting: Family Medicine

## 2023-07-02 VITALS — BP 118/74 | HR 100 | Wt 258.0 lb

## 2023-07-02 VITALS — BP 115/77 | HR 85 | Temp 98.2°F | Ht 77.0 in | Wt 257.8 lb

## 2023-07-02 DIAGNOSIS — R21 Rash and other nonspecific skin eruption: Secondary | ICD-10-CM | POA: Diagnosis present

## 2023-07-02 DIAGNOSIS — N1831 Chronic kidney disease, stage 3a: Secondary | ICD-10-CM | POA: Diagnosis not present

## 2023-07-02 DIAGNOSIS — I129 Hypertensive chronic kidney disease with stage 1 through stage 4 chronic kidney disease, or unspecified chronic kidney disease: Secondary | ICD-10-CM

## 2023-07-02 DIAGNOSIS — I10 Essential (primary) hypertension: Secondary | ICD-10-CM

## 2023-07-02 DIAGNOSIS — R7303 Prediabetes: Secondary | ICD-10-CM | POA: Diagnosis not present

## 2023-07-02 LAB — POCT GLYCOSYLATED HEMOGLOBIN (HGB A1C): Hemoglobin A1C: 6.3 % — AB (ref 4.0–5.6)

## 2023-07-02 LAB — POCT SKIN KOH: Skin KOH, POC: NEGATIVE

## 2023-07-02 LAB — GLUCOSE, CAPILLARY: Glucose-Capillary: 120 mg/dL — ABNORMAL HIGH (ref 70–99)

## 2023-07-02 MED ORDER — EMPAGLIFLOZIN 10 MG PO TABS: 10.0000 mg | ORAL_TABLET | Freq: Every day | ORAL | 6 refills | Status: DC

## 2023-07-02 MED ORDER — BETAMETHASONE DIPROPIONATE 0.05 % EX CREA: TOPICAL_CREAM | Freq: Two times a day (BID) | CUTANEOUS | 0 refills | Status: AC

## 2023-07-02 MED ORDER — AMLODIPINE BESYLATE 5 MG PO TABS: 5.0000 mg | ORAL_TABLET | Freq: Every day | ORAL | 11 refills | Status: DC

## 2023-07-02 MED ORDER — OLMESARTAN MEDOXOMIL 40 MG PO TABS: 40.0000 mg | ORAL_TABLET | Freq: Every day | ORAL | 11 refills | Status: AC

## 2023-07-02 NOTE — Assessment & Plan Note (Signed)
 A1c noted to have been elevated at 6.4 roughly 5 months ago.  He has been addressing this with improved diet and exercise.  Today, will repeat A1c for possible additional intervention. - Follow-up A1c

## 2023-07-02 NOTE — Assessment & Plan Note (Signed)
 GFR on 03/05/2022 consistent with stage IIIb with GFR 42.  5 months ago, his GFR was 59 consistent with stage IIIa.  His hypertension is currently managed with olmesartan -amlodipine -HCTZ 40-5-25 mg/day.  Given his CKD and elevated A1c, I feel that the patient would benefit from SGLT2 inhibitor.  At this time, will discontinue HCTZ and begin Jardiance  10 mg daily - Stop HCTZ 25 mg daily - Begin Jardiance  10 mg daily - Continue olmesartan  40 mg daily and amlodipine  5 mg daily

## 2023-07-02 NOTE — Patient Instructions (Signed)
 Try betamethasone  cream twice daily for 2 weeks on your left leg  If worsening or no improvement, please follow up  I will let you know if any of your results from today are abnormal or require treatment

## 2023-07-02 NOTE — Progress Notes (Signed)
    SUBJECTIVE:   CHIEF COMPLAINT / HPI:   Rash on face and L lower leg  Face: -Ongoing several years -Feels dry and irritated mainly around nose -Skin sometimes more red in the mornings -No bleeding, drainage -No significant acne as a kid -Tried a cream with minimal benefit - unsure what this was -Only uses vaseline on his face, does not use soap or other lotions   LLE: -About 31mo ago, noticed rash after wearing stockings on left leg -Afterwards, stopped wearing stockings -Rash still present even though he stopped wearing stockings -Tried a cream with minimal benefit, doesn't remember what this was -Two of the spots are kind of itchy but otherwise asymptomatic  No recent fevers, illness, travel outside of country, bug bites, change in soaps/detergents/clothes  States he did see his vascular surgeon as well as his infectious disease doctor regarding his leg rash.  Did not feel that it was related to vascular issues or HIV/medication  PERTINENT  PMH / PSH: HIV, most recent CD4 count 727.  On Dovato   OBJECTIVE:   BP 118/74   Pulse 100   Wt 258 lb (117 kg)   SpO2 96%   BMI 30.59 kg/m    General: NAD, pleasant, able to participate in exam Respiratory: No respiratory distress Psych: Normal affect and mood  Skin: Face: Very slight erythema and dry skin around the nose.  No significant lesions, excoriations, bleeding  Left lower extremity: See image.  Scattered mostly macular erythematous lesions about 4-6 cm in size.  Minimal excoriation.  1 lesions hyperpigmented.  2 of the lesions are slightly raised and have overlying scaly skin.     ASSESSMENT/PLAN:   Assessment & Plan Rash Left lower leg: Unclear etiology.  Does not necessarily look fungal.  KOH prep was negative which is also reassuring against tinea. Punch biopsy today for further evaluation.  Biopsied the slightly raised and scaly lesion on lower leg. Trial topical Betamethasone  twice daily x 2 weeks,  follow-up if no improvement or worsening  For his face, possible seborrheic dermatitis versus atopic dermatitis.  For now, discussed good hygiene and moisturization.  If no improvement with moisturizing agents could consider topical antifungals or steroids.  Follow-up in 2 to 4 weeks if no improvement  Pre-op Diagnosis: Rash Post-op Diagnosis: Same Procedure: Punch Skin Biopsy Location: Left lower extremity Performing Physician: Edison Gore, MD Supervising Physician (if applicable): [place]  Informed consent was obtained prior to the procedure. Time out performed. The area surrounding the skin lesion was prepared and cleaned with alcohol prep. The area was sufficiently anesthetized with 1% Lidocaine  without epinephrine.  A size 3mm disposable biopsy punch tool was used to obtain tissue sample and placed in labeled biopsy cup. gauze was used to obtain hemostasis. The site was not closed, bandage placed. The patient tolerated the procedure well without complications.  Standard post-procedure care was explained and return precautions and wound care handout given.   Edison Gore, MD [facility]

## 2023-07-02 NOTE — Assessment & Plan Note (Addendum)
 Left lower leg: Unclear etiology.  Does not necessarily look fungal.  KOH prep was negative which is also reassuring against tinea. Punch biopsy today for further evaluation.  Biopsied the slightly raised and scaly lesion on lower leg. Trial topical Betamethasone  twice daily x 2 weeks, follow-up if no improvement or worsening  For his face, possible seborrheic dermatitis versus atopic dermatitis.  For now, discussed good hygiene and moisturization.  If no improvement with moisturizing agents could consider topical antifungals or steroids.  Follow-up in 2 to 4 weeks if no improvement

## 2023-07-02 NOTE — Progress Notes (Signed)
 CC: Hypertension and prediabetes  HPI: [name] is a 63 y.o. male living with a history stated below and presents today for hypertension and prediabetes. Please see problem based assessment and plan for additional details.  Past Medical History:  Diagnosis Date   Arthritis    Enlarged prostate    Essential hypertension    GERD (gastroesophageal reflux disease)    Gout    diet controlled no recent flare ups   HIV infection (HCC)    Superficial vein thrombosis 08/08/2020    Current Outpatient Medications on File Prior to Visit  Medication Sig Dispense Refill   atorvastatin  (LIPITOR) 10 MG tablet Take 1 tablet (10 mg total) by mouth daily. 30 tablet 11   clotrimazole  (LOTRIMIN ) 1 % cream Apply 1 Application topically 2 (two) times daily. 30 g 0   dolutegravir -lamiVUDine (DOVATO ) 50-300 MG tablet Take 1 tablet by mouth daily. 30 tablet 5   fluticasone  (FLONASE ) 50 MCG/ACT nasal spray SHAKE LIQUID AND USE 2 SPRAYS IN EACH NOSTRIL TWICE DAILY 16 g 3   ketoconazole  (NIZORAL ) 2 % cream Apply 1 Application topically 2 (two) times daily. 60 g 0   pantoprazole  (PROTONIX ) 40 MG tablet Take 1 tablet (40 mg total) by mouth 2 (two) times daily. 180 tablet 2   polyethylene glycol (MIRALAX ) 17 g packet Take 17 g by mouth daily. 14 each 0   sildenafil  (VIAGRA ) 50 MG tablet Take 1 tablet (50 mg total) by mouth as needed for erectile dysfunction. 20 tablet 3   triamcinolone  cream (KENALOG ) 0.1 % Apply 1 Application topically 2 (two) times daily. 30 g 0   No current facility-administered medications on file prior to visit.    Family History  Problem Relation Age of Onset   Alcohol abuse Mother    Hypertension Father    Stroke Father     Social History   Socioeconomic History   Marital status: Single    Spouse name: Not on file   Number of children: Not on file   Years of education: Not on file   Highest education level: Not on file  Occupational History   Not on file   Tobacco Use   Smoking status: Never    Passive exposure: Never   Smokeless tobacco: Never  Vaping Use   Vaping status: Never Used  Substance and Sexual Activity   Alcohol use: Not Currently    Alcohol/week: 0.0 standard drinks of alcohol    Comment: none since 2017   Drug use: Not Currently    Types: Marijuana, "Crack" cocaine    Comment:  cocaine and marijuana none since 2017   Sexual activity: Not Currently    Comment: refused  condoms 11/09/2019  Other Topics Concern   Not on file  Social History Narrative   Not on file   Social Drivers of Health   Financial Resource Strain: Not on file  Food Insecurity: No Food Insecurity (03/05/2022)   Hunger Vital Sign    Worried About Running Out of Food in the Last Year: Never true    Ran Out of Food in the Last Year: Never true  Transportation Needs: No Transportation Needs (03/05/2022)   PRAPARE - Administrator, Civil Service (Medical): No    Lack of Transportation (Non-Medical): No  Physical Activity: Not on file  Stress: Not on file  Social Connections: Moderately Isolated (03/05/2022)   Social Connection and Isolation Panel [NHANES]    Frequency of Communication with Friends and Family:  More than three times a week    Frequency of Social Gatherings with Friends and Family: More than three times a week    Attends Religious Services: More than 4 times per year    Active Member of [employer] or Organizations: No    Attends Banker Meetings: Never    Marital Status: Never married  Intimate Partner Violence: Not At Risk (03/05/2022)   Humiliation, Afraid, Rape, and Kick questionnaire    Fear of Current or Ex-Partner: No    Emotionally Abused: No    Physically Abused: No    Sexually Abused: No    Review of Systems: ROS negative except for what is noted on the assessment and plan.  Vitals:   07/02/23 0936  BP: 115/77  Pulse: 85  Temp: 98.2 F (36.8 C)  TempSrc: Oral  SpO2: 97%  Weight: 257 lb 12.8 oz  (116.9 kg)  Height: 6\' 5"  (1.956 m)    Physical Exam: Constitutional: well-appearing in no acute distress HENT: normocephalic atraumatic, mucous membranes moist Eyes: conjunctiva non-erythematous Neck: supple Cardiovascular: regular rate and rhythm, no m/r/g Pulmonary/Chest: normal work of breathing on room air, lungs clear to auscultation bilaterally Abdominal: soft, non-tender, non-distended MSK: normal bulk and tone Neurological: alert & oriented x 3, 5/5 strength in bilateral upper and lower extremities, normal gait Skin: warm and dry  Assessment & Plan:   Prediabetes A1c noted to have been elevated at 6.4 roughly 5 months ago.  He has been addressing this with improved diet and exercise.  Today, will repeat A1c for possible additional intervention. - Follow-up A1c  Hypertension GFR on 03/05/2022 consistent with stage IIIb with GFR 42.  5 months ago, his GFR was 59 consistent with stage IIIa.  His hypertension is currently managed with olmesartan -amlodipine -HCTZ 40-5-25 mg/day.  Given his CKD and elevated A1c, I feel that the patient would benefit from SGLT2 inhibitor.  At this time, will discontinue HCTZ and begin Jardiance  10 mg daily - Stop HCTZ 25 mg daily - Begin Jardiance  10 mg daily - Continue olmesartan  40 mg daily and amlodipine  5 mg daily  Patient discussed with Dr. Brigitte Canard, MD  [facility] Internal Medicine, PGY-1 Date 07/02/2023 Time 11:34 AM

## 2023-07-02 NOTE — Patient Instructions (Signed)
 Thank you so much for coming to the clinic today!   Please stop taking the olmesartan -amlodipine -hydrochlorothiazide . Instead, you will take the olmesartan  and amlodipine  separately. You will also start taking the Jardiance 10 mg daily.  We will see you in three months.   If you have any questions please feel free to the call the clinic at anytime at [phone]. It was a pleasure seeing you!  Best, Dr. Carolee Churchman

## 2023-07-03 ENCOUNTER — Telehealth: Payer: Self-pay

## 2023-07-03 NOTE — Progress Notes (Signed)
 Internal Medicine Clinic Attending  Case discussed with the resident at the time of the visit.  We reviewed the resident's history and exam and pertinent patient test results.  I agree with the assessment, diagnosis, and plan of care documented in the resident's note.

## 2023-07-03 NOTE — Telephone Encounter (Signed)
 Received a fax from the pharmacy regarding a rx for Jardiance. Per pharmacy the drug is not covered by the patients plan. The preferred alternatives are farxiga, xigdouo xr, synjardy xr brenzavvy, dapagliflozin metformin.

## 2023-07-04 NOTE — Telephone Encounter (Signed)
 Pt wants to know should he go ahead and stop the med with hydrochlorothiazide  even though he's unable to get the Jardiance And start taking Olmesartan  and Amlodipine  only as instructed. Or continued th combo pill of 3 meds?

## 2023-07-04 NOTE — Telephone Encounter (Signed)
 Copied from CRM [id number]. Topic: Clinical - Medication Question >> Jul 04, 2023  8:48 AM Shelby Dessert H wrote: Reason for CRM: Patient is wanting to know since he is wanting on his new script to be filled or changed (Jardiance) could he continue to take his other blood pressure medicine until this new medicine is filled? Patient is requesting a callback at [phone].

## 2023-07-07 ENCOUNTER — Telehealth: Payer: Self-pay

## 2023-07-07 NOTE — Telephone Encounter (Signed)
 Pharmacy Patient Advocate Encounter   Received notification from CoverMyMeds that prior authorization for Betamethasone  Dipropionate 0.05% cream is required/requested.   Insurance verification completed.   The patient is insured through [facility] [place] .   Per test claim:  THE FOLLOWING is preferred by the insurance.  If suggested medication is appropriate, Please send in a new RX and discontinue this one. If not, please advise as to why it's not appropriate so that we may request a Prior Authorization. Please note, some preferred medications may still require a PA.  If the suggested medications have not been trialed and there are no contraindications to their use, the PA will not be submitted, as it will not be approved.

## 2023-07-08 ENCOUNTER — Telehealth: Payer: Self-pay | Admitting: *Deleted

## 2023-07-08 LAB — DERMATOLOGY PATHOLOGY

## 2023-07-08 NOTE — Telephone Encounter (Signed)
 Call to Pharmacy Jardiance  will be ready for pick up by patient on Thursday after 12 noon.  Patient was called and informed of. Copied from CRM [id number]. Topic: Clinical - Medication Prior Auth >> Jul 08, 2023  9:46 AM Tisa Forester wrote: Reason for CRM: medication followup for an  checking on status of the empagliflozin  (JARDIANCE ) 10 MG TABS tablet to see if is approved   Patient call back # [phone]

## 2023-07-09 NOTE — Telephone Encounter (Signed)
 Pt called and instructed "He should stop the hydrochlorothiazide  only after starting the Jardiance " per Dr Carolee Churchman. Pt repeated and voiced understanding

## 2023-07-10 ENCOUNTER — Ambulatory Visit: Payer: Self-pay | Admitting: Family Medicine

## 2023-07-18 ENCOUNTER — Telehealth: Payer: Self-pay | Admitting: *Deleted

## 2023-07-18 NOTE — Telephone Encounter (Unsigned)
 Copied from CRM [id number]. Topic: Clinical - Medication Question >> Jul 18, 2023  9:19 AM Tisa Forester wrote: Reason for CRM: patient 's provider Maxie Spaniel told patient when he start taking the empagliflozin  (JARDIANCE ) 10 MG TABS tablet to stop taking his blood pressure medication , patient have question want to know which blood pressure medication do he stop taking ?   patient call back [phone]

## 2023-07-21 ENCOUNTER — Other Ambulatory Visit: Payer: Self-pay | Admitting: Student

## 2023-07-21 DIAGNOSIS — J302 Other seasonal allergic rhinitis: Secondary | ICD-10-CM

## 2023-08-20 ENCOUNTER — Ambulatory Visit: Payer: Self-pay | Admitting: Student

## 2023-08-20 ENCOUNTER — Encounter: Payer: Self-pay | Admitting: Student

## 2023-08-20 ENCOUNTER — Other Ambulatory Visit: Payer: Self-pay | Admitting: Family

## 2023-08-20 ENCOUNTER — Other Ambulatory Visit: Payer: Self-pay

## 2023-08-20 VITALS — BP 117/80 | HR 76 | Temp 98.0°F | Ht 77.0 in | Wt 250.8 lb

## 2023-08-20 DIAGNOSIS — B20 Human immunodeficiency virus [HIV] disease: Secondary | ICD-10-CM

## 2023-08-20 DIAGNOSIS — I10 Essential (primary) hypertension: Secondary | ICD-10-CM

## 2023-08-20 DIAGNOSIS — N1831 Chronic kidney disease, stage 3a: Secondary | ICD-10-CM

## 2023-08-20 DIAGNOSIS — R7303 Prediabetes: Secondary | ICD-10-CM | POA: Diagnosis present

## 2023-08-20 NOTE — Progress Notes (Signed)
 CC: Concern for worsening kidney disease  HPI:  Chad Little is a 63 y.o. male living with a history stated below and presents today due to concern for worsening kidney disease. Please see problem based assessment and plan for additional details.  Past Medical History:  Diagnosis Date   Arthritis    Enlarged prostate    Essential hypertension    GERD (gastroesophageal reflux disease)    Gout    diet controlled no recent flare ups   HIV infection (HCC)    Superficial vein thrombosis 08/08/2020    Current Outpatient Medications on File Prior to Visit  Medication Sig Dispense Refill   atorvastatin  (LIPITOR) 10 MG tablet Take 1 tablet (10 mg total) by mouth daily. 30 tablet 11   betamethasone  dipropionate 0.05 % cream Apply topically 2 (two) times daily. For 2 weeks. Do not use on face. 30 g 0   clotrimazole  (LOTRIMIN ) 1 % cream Apply 1 Application topically 2 (two) times daily. 30 g 0   empagliflozin  (JARDIANCE ) 10 MG TABS tablet Take 1 tablet (10 mg total) by mouth daily before breakfast. 30 tablet 6   fluticasone  (FLONASE ) 50 MCG/ACT nasal spray SHAKE LIQUID AND USE 2 SPRAYS IN EACH NOSTRIL TWICE DAILY 16 g 3   ketoconazole  (NIZORAL ) 2 % cream Apply 1 Application topically 2 (two) times daily. 60 g 0   olmesartan  (BENICAR ) 40 MG tablet Take 1 tablet (40 mg total) by mouth daily. 30 tablet 11   pantoprazole  (PROTONIX ) 40 MG tablet Take 1 tablet (40 mg total) by mouth 2 (two) times daily. 180 tablet 2   polyethylene glycol (MIRALAX ) 17 g packet Take 17 g by mouth daily. 14 each 0   sildenafil  (VIAGRA ) 50 MG tablet Take 1 tablet (50 mg total) by mouth as needed for erectile dysfunction. 20 tablet 3   triamcinolone  cream (KENALOG ) 0.1 % Apply 1 Application topically 2 (two) times daily. 30 g 0   No current facility-administered medications on file prior to visit.    Family History  Problem Relation Age of Onset   Alcohol abuse Mother    Hypertension Father    Stroke  Father     Social History   Socioeconomic History   Marital status: Single    Spouse name: Not on file   Number of children: Not on file   Years of education: Not on file   Highest education level: Not on file  Occupational History   Not on file  Tobacco Use   Smoking status: Never    Passive exposure: Never   Smokeless tobacco: Never  Vaping Use   Vaping status: Never Used  Substance and Sexual Activity   Alcohol use: Not Currently    Alcohol/week: 0.0 standard drinks of alcohol    Comment: none since 2017   Drug use: Not Currently    Types: Marijuana, Crack cocaine    Comment:  cocaine and marijuana none since 2017   Sexual activity: Not Currently    Comment: refused  condoms 11/09/2019  Other Topics Concern   Not on file  Social History Narrative   Not on file   Social Drivers of Health   Financial Resource Strain: Not on file  Food Insecurity: No Food Insecurity (03/05/2022)   Hunger Vital Sign    Worried About Running Out of Food in the Last Year: Never true    Ran Out of Food in the Last Year: Never true  Transportation Needs: No Transportation Needs (03/05/2022)  PRAPARE - Administrator, Civil Service (Medical): No    Lack of Transportation (Non-Medical): No  Physical Activity: Not on file  Stress: Not on file  Social Connections: Moderately Isolated (03/05/2022)   Social Connection and Isolation Panel    Frequency of Communication with Friends and Family: More than three times a week    Frequency of Social Gatherings with Friends and Family: More than three times a week    Attends Religious Services: More than 4 times per year    Active Member of [employer] or Organizations: No    Attends Banker Meetings: Never    Marital Status: Never married  Intimate Partner Violence: Not At Risk (03/05/2022)   Humiliation, Afraid, Rape, and Kick questionnaire    Fear of Current or Ex-Partner: No    Emotionally Abused: No    Physically Abused: No     Sexually Abused: No    Review of Systems: ROS negative except for what is noted on the assessment and plan.  Vitals:   08/20/23 1345  BP: 117/80  Pulse: 76  Temp: 98 F (36.7 C)  TempSrc: Oral  SpO2: 98%  Weight: 250 lb 12.8 oz (113.8 kg)  Height: 6' 5 (1.956 m)    Physical Exam: Constitutional: well-appearing man, sitting in chair, in no acute distress HENT: No thrush noted  Cardiovascular: regular rate and rhythm, no m/r/g Pulmonary/Chest: normal work of breathing on room air, lungs clear to auscultation bilaterally Neurological: alert & oriented x 3 Skin: warm and dry. No rashes on skin  Psych: normal mood and behavior  Assessment & Plan:   Chronic kidney disease, stage 3a [facility]) Chad Little presented to the office with concerns regarding potential progression of his chronic kidney disease (CKD). He has a known history of CKD stage IIIa. His most recent CMP showed a creatinine increase from 1.34 to 1.55, with a corresponding decline in eGFR from 59 to 50. He currently denies any urinary symptoms.His HIV regimen was recently switched to Dovato , a change made in consideration of his underlying CKD. It was noted that a urine microalbumin-to-creatinine ratio has not been obtained in some time, despite his history of prediabetes.Given the lack of recent monitoring, it is reasonable to repeat a CMP today to assess the current stability of his renal function. The patient was reassured that he is on Jardiance  and olmesartan , both of which offer renal protective benefits. - CMP - ACR  HIV disease (HCC) Well-controlled on Dovato .  It appears patient has a close follow-up with ID clinic for which she has been encouraged to continue to do so.  No active concerns at this time  Prediabetes Lab Results  Component Value Date   HGBA1C 6.3 (A) 07/02/2023   HGBA1C 6.4 (H) 01/15/2023   HGBA1C 6.0 (A) 08/22/2022  Well-controlled with lifestyle modification and diet. - Repeat hemoglobin  A1c at next visit     Patient discussed with Dr. Karna Drue Grow, M.D [facility] Phone: [phone] Date 08/20/2023 Time 6:43 PM

## 2023-08-20 NOTE — Assessment & Plan Note (Signed)
 Well-controlled on Dovato .  It appears patient has a close follow-up with ID clinic for which she has been encouraged to continue to do so.  No active concerns at this time

## 2023-08-20 NOTE — Assessment & Plan Note (Addendum)
 Lab Results  Component Value Date   HGBA1C 6.3 (A) 07/02/2023   HGBA1C 6.4 (H) 01/15/2023   HGBA1C 6.0 (A) 08/22/2022  Well-controlled with lifestyle modification and diet. - Repeat hemoglobin A1c at next visit

## 2023-08-20 NOTE — Patient Instructions (Addendum)
 Thank you, Chad Little for allowing us  to provide your care today. Today we discussed your general health.   I am getting some blood work and will call you when they come back    I have ordered the following labs for you:  Lab Orders         Microalbumin / Creatinine Urine Ratio         Comprehensive metabolic panel with GFR       Tests ordered today:    Referrals ordered today:   Referral Orders  No referral(s) requested today     I have ordered the following medication/changed the following medications:   Stop the following medications: Medications Discontinued During This Encounter  Medication Reason   amLODipine  (NORVASC ) 5 MG tablet Discontinued by provider     Start the following medications: No orders of the defined types were placed in this encounter.    Follow up: 3 months   Remember:   Should you have any questions or concerns please call the internal medicine clinic at [phone].   Drue Lisa Grow MD 08/20/2023, 2:31 PM   [facility]

## 2023-08-20 NOTE — Assessment & Plan Note (Addendum)
 Mr. Holzheimer presented to the office with concerns regarding potential progression of his chronic kidney disease (CKD). He has a known history of CKD stage IIIa. His most recent CMP showed a creatinine increase from 1.34 to 1.55, with a corresponding decline in eGFR from 59 to 50. He currently denies any urinary symptoms.His HIV regimen was recently switched to Dovato , a change made in consideration of his underlying CKD. It was noted that a urine microalbumin-to-creatinine ratio has not been obtained in some time, despite his history of prediabetes.Given the lack of recent monitoring, it is reasonable to repeat a CMP today to assess the current stability of his renal function. The patient was reassured that he is on Jardiance  and olmesartan , both of which offer renal protective benefits. - CMP - ACR

## 2023-08-21 LAB — COMPREHENSIVE METABOLIC PANEL WITH GFR
ALT: 22 IU/L (ref 0–44)
AST: 22 IU/L (ref 0–40)
Albumin: 4.7 g/dL (ref 3.9–4.9)
Alkaline Phosphatase: 103 IU/L (ref 44–121)
BUN/Creatinine Ratio: 10 (ref 10–24)
BUN: 13 mg/dL (ref 8–27)
Bilirubin Total: 0.5 mg/dL (ref 0.0–1.2)
CO2: 17 mmol/L — ABNORMAL LOW (ref 20–29)
Calcium: 9.8 mg/dL (ref 8.6–10.2)
Chloride: 105 mmol/L (ref 96–106)
Creatinine, Ser: 1.26 mg/dL (ref 0.76–1.27)
Globulin, Total: 2.9 g/dL (ref 1.5–4.5)
Glucose: 90 mg/dL (ref 70–99)
Potassium: 4.8 mmol/L (ref 3.5–5.2)
Sodium: 143 mmol/L (ref 134–144)
Total Protein: 7.6 g/dL (ref 6.0–8.5)
eGFR: 63 mL/min/1.73 (ref >59)

## 2023-08-21 LAB — MICROALBUMIN / CREATININE URINE RATIO
Creatinine, Urine: 101.7 mg/dL
Microalb/Creat Ratio: 3 mg/g{creat} (ref 0–29)
Microalbumin, Urine: 3.4 ug/mL

## 2023-08-25 NOTE — Progress Notes (Signed)
 Internal Medicine Clinic Attending  Case discussed with the resident at the time of the visit.  We reviewed the resident's history and exam and pertinent patient test results.  I agree with the assessment, diagnosis, and plan of care documented in the resident's note.

## 2023-08-28 ENCOUNTER — Other Ambulatory Visit (HOSPITAL_COMMUNITY): Payer: Self-pay

## 2023-09-04 ENCOUNTER — Ambulatory Visit: Payer: Self-pay | Admitting: Student

## 2023-09-04 VITALS — BP 137/90 | HR 73 | Temp 98.1°F | Wt 248.8 lb

## 2023-09-04 DIAGNOSIS — N529 Male erectile dysfunction, unspecified: Secondary | ICD-10-CM

## 2023-09-04 DIAGNOSIS — Z9079 Acquired absence of other genital organ(s): Secondary | ICD-10-CM | POA: Diagnosis present

## 2023-09-04 NOTE — Progress Notes (Unsigned)
 CC: Urinary issues  HPI:  Chad Little is a 63 y.o. male living with a history stated below and presents today for follow-up. Please see problem based assessment and plan for additional details.  Past Medical History:  Diagnosis Date   Arthritis    Enlarged prostate    Essential hypertension    GERD (gastroesophageal reflux disease)    Gout    diet controlled no recent flare ups   HIV infection (HCC)    Superficial vein thrombosis 08/08/2020    Current Outpatient Medications on File Prior to Visit  Medication Sig Dispense Refill   ketoconazole  (NIZORAL ) 2 % cream Apply 1 Application topically 2 (two) times daily. 60 g 0   atorvastatin  (LIPITOR) 10 MG tablet Take 1 tablet (10 mg total) by mouth daily. 30 tablet 11   betamethasone  dipropionate 0.05 % cream Apply topically 2 (two) times daily. For 2 weeks. Do not use on face. 30 g 0   clotrimazole  (LOTRIMIN ) 1 % cream Apply 1 Application topically 2 (two) times daily. 30 g 0   DOVATO  50-300 MG tablet TAKE 1 TABLET BY MOUTH DAILY 30 tablet 5   empagliflozin  (JARDIANCE ) 10 MG TABS tablet Take 1 tablet (10 mg total) by mouth daily before breakfast. 30 tablet 6   fluticasone  (FLONASE ) 50 MCG/ACT nasal spray SHAKE LIQUID AND USE 2 SPRAYS IN EACH NOSTRIL TWICE DAILY 16 g 3   olmesartan  (BENICAR ) 40 MG tablet Take 1 tablet (40 mg total) by mouth daily. 30 tablet 11   pantoprazole  (PROTONIX ) 40 MG tablet Take 1 tablet (40 mg total) by mouth 2 (two) times daily. 180 tablet 2   polyethylene glycol (MIRALAX ) 17 g packet Take 17 g by mouth daily. 14 each 0   sildenafil  (VIAGRA ) 50 MG tablet Take 1 tablet (50 mg total) by mouth as needed for erectile dysfunction. 20 tablet 3   triamcinolone  cream (KENALOG ) 0.1 % Apply 1 Application topically 2 (two) times daily. 30 g 0   No current facility-administered medications on file prior to visit.    Family History  Problem Relation Age of Onset   Alcohol abuse Mother    Hypertension  Father    Stroke Father     Social History   Socioeconomic History   Marital status: Single    Spouse name: Not on file   Number of children: Not on file   Years of education: Not on file   Highest education level: Not on file  Occupational History   Not on file  Tobacco Use   Smoking status: Never    Passive exposure: Never   Smokeless tobacco: Never  Vaping Use   Vaping status: Never Used  Substance and Sexual Activity   Alcohol use: Not Currently    Alcohol/week: 0.0 standard drinks of alcohol    Comment: none since 2017   Drug use: Not Currently    Types: Marijuana, Crack cocaine    Comment:  cocaine and marijuana none since 2017   Sexual activity: Not Currently    Comment: refused  condoms 11/09/2019  Other Topics Concern   Not on file  Social History Narrative   Not on file   Social Drivers of Health   Financial Resource Strain: Not on file  Food Insecurity: No Food Insecurity (09/04/2023)   Hunger Vital Sign    Worried About Running Out of Food in the Last Year: Never true    Ran Out of Food in the Last Year: Never true  Transportation Needs: No Transportation Needs (09/04/2023)   PRAPARE - Administrator, Civil Service (Medical): No    Lack of Transportation (Non-Medical): No  Physical Activity: Not on file  Stress: Not on file  Social Connections: Moderately Isolated (03/05/2022)   Social Connection and Isolation Panel    Frequency of Communication with Friends and Family: More than three times a week    Frequency of Social Gatherings with Friends and Family: More than three times a week    Attends Religious Services: More than 4 times per year    Active Member of [employer] or Organizations: No    Attends Banker Meetings: Never    Marital Status: Never married  Intimate Partner Violence: Not At Risk (09/04/2023)   Humiliation, Afraid, Rape, and Kick questionnaire    Fear of Current or Ex-Partner: No    Emotionally Abused: No     Physically Abused: No    Sexually Abused: No    Review of Systems: ROS negative except for what is noted on the assessment and plan.  Vitals:   09/04/23 0917 09/04/23 0935  BP: (!) 144/100 (!) 137/90  Pulse: 87 73  Temp: 98.1 F (36.7 C)   TempSrc: Oral   SpO2: 98%   Weight: 248 lb 12.8 oz (112.9 kg)     Physical Exam: Constitutional: well-appearing, sitting in chair, in no acute distress Cardiovascular: regular rate and rhythm, no m/r/g Pulmonary/Chest: normal work of breathing on room air, lungs clear to auscultation bilaterally Abdominal: soft, non-tender, non-distended   Assessment & Plan:     Patient discussed Dr. Francesco  S/P TURP S/P TURP in 2022 for BPH.  Since the procedure, when he has an orgasm, nothing comes out.  He previously asked the urologist about this and they stated that it will come out in his urine.  He states that sometimes it comes out in his urine and sometimes it does not.  This has been concerning to him.  He denies dysuria.  Abdominal exam today unremarkable.  Not much to be done from our standpoint, so I will send a referral to urology as I believe his newer insurance will allow him to be seen again.   Norman Lobstein, D.O. [facility] Internal Medicine, PGY-2 Phone: [phone] Date 09/05/2023 Time 5:15 PM

## 2023-09-05 NOTE — Assessment & Plan Note (Signed)
 S/P TURP in 2022 for BPH.  Since the procedure, when he has an orgasm, nothing comes out.  He previously asked the urologist about this and they stated that it will come out in his urine.  He states that sometimes it comes out in his urine and sometimes it does not.  This has been concerning to him.  He denies dysuria.  Abdominal exam today unremarkable.  Not much to be done from our standpoint, so I will send a referral to urology as I believe his newer insurance will allow him to be seen again.

## 2023-09-08 NOTE — Progress Notes (Signed)
 Internal Medicine Clinic Attending  Case discussed with the resident at the time of the visit.  We reviewed the resident's history and exam and pertinent patient test results.  I agree with the assessment, diagnosis, and plan of care documented in the resident's note.

## 2023-09-15 ENCOUNTER — Telehealth: Payer: Self-pay | Admitting: *Deleted

## 2023-09-15 NOTE — Telephone Encounter (Signed)
  RTC to patient  states has noted a change in his blood pressure since coming off the previous 3 med combination pill due to his kidney B/P are is now 125/90, 144/99, 137/91,   149/ 91, 132/88,130/92 , 128/92 .  States has also noted more constipation .  Was able to Miralax  and coffee in the am with good results.  Metamucil also does not work.  Did  also note a change in his medication from ID.   Wants to know what he can do to decrease the constipation.  Is still able to go but difficulty.                             Copied from CRM #[id number]. Topic: Clinical - Medical Advice >> Sep 15, 2023  8:19 AM Farrel B wrote: Reason for CRM: Patient has called in from [phone], patient stated he's has some issues with blood pressure medications, he stated after adding a blood pressure to his regimen his blood pressure is no longer regular, and also dealing with severe constipation. Please call patient to further assist.

## 2023-10-07 ENCOUNTER — Other Ambulatory Visit (HOSPITAL_COMMUNITY): Payer: Self-pay

## 2023-10-07 ENCOUNTER — Ambulatory Visit (INDEPENDENT_AMBULATORY_CARE_PROVIDER_SITE_OTHER): Payer: Medicare (Managed Care)

## 2023-10-07 VITALS — BP 131/89 | HR 73 | Temp 98.3°F | Wt 243.8 lb

## 2023-10-07 DIAGNOSIS — K5909 Other constipation: Secondary | ICD-10-CM | POA: Diagnosis not present

## 2023-10-07 DIAGNOSIS — N529 Male erectile dysfunction, unspecified: Secondary | ICD-10-CM | POA: Diagnosis not present

## 2023-10-07 MED ORDER — SILDENAFIL CITRATE 50 MG PO TABS
50.0000 mg | ORAL_TABLET | ORAL | 3 refills | Status: AC | PRN
  Filled 2023-10-07: qty 20, 20d supply, fill #0
  Filled 2023-11-21: qty 20, 20d supply, fill #1
  Filled 2024-01-22: qty 20, 20d supply, fill #2

## 2023-10-07 NOTE — Patient Instructions (Addendum)
 Thank you, [name] Chad Little for allowing us  to provide your care today. Today we discussed the persistent constipation you have been experiencing.   Changes to medications: -Take 2 packets of the Miralax  for the next few days. If this does not work, please pick up senna 8.6mg  over the counter to take alongside the Miralax  to help. - Try adding Dates to your diet -Refill of your Viagra     I will call if any are abnormal. All of your labs can be accessed through My Chart.   My Chart Access: [email or web address]  Please follow-up in: 3 months to follow up your diabetes and blood pressure    We look forward to seeing you next time. Please call our clinic at [phone] if you have any questions or concerns. The best time to call is Monday-Friday from 9am-4pm, but there is someone available 24/7. If after hours or the weekend, call the main hospital number and ask for the Internal Medicine Resident On-Call. If you need medication refills, please notify your pharmacy one week in advance and they will send us  a request.   Thank you for letting us  take part in your care. Wishing you the best!  Chad Fabela, DO 10/07/2023, 3:09 PM [facility]

## 2023-10-07 NOTE — Progress Notes (Signed)
 Acute Office Visit  Subjective:     Patient ID: Chad Little, male    DOB: Feb 27, 1960, 63 y.o.   MRN: [id number]  Chief Complaint  Patient presents with   Medication Refill    Chad Little is a 63 year old male with a past medical history of hypertension, HIV, CKD 3a, and BPH s/p TURP who presents to the clinic today for an acute visit regarding persistent constipation and medication refills.  He notes that his last normal bowel movement that was good was last Wednesday or Thursday.  He states that he has had a hard time passing stools but remains gassy.  He has been taking MiraLAX  nightly, which tends to help him have normal bowel movements.  He reports good fiber intake, water intake, and exercise.  He was out of town for the last 3 weeks, but insists that he remained on his normal regimen.  He notes that he has recently changed a few of his medicines including his Dovato  and antihypertensives from Olmesartan -Amlodipine -hydrochlorothiazide  combination pill to separate Olmesartan  40mg  and Amlodipine  5mg  alone.  He has also taken Allegra a few times this past month due to increased need for allergies.  He tends to eat healthy, denying any greasy or fried foods, but notices his stomach is gurgling more than normal and is having bowel movements that are shaggy in appearance. He denies any blood in his stool, weight loss, or any chronic Tylenol /NSAID use.  Please see below for further assessment and plan.     Review of Systems  Constitutional:  Negative for chills, fever and weight loss.  Respiratory: Negative.    Cardiovascular: Negative.   Gastrointestinal:  Positive for constipation. Negative for abdominal pain, blood in stool, diarrhea, nausea and vomiting.  Genitourinary: Negative.   Musculoskeletal: Negative.   Neurological: Negative.   Psychiatric/Behavioral: Negative.        Objective:    BP 131/89 (BP Location: Right Arm, Patient Position: Sitting, Cuff Size:  Small)   Pulse 73   Temp 98.3 F (36.8 C) (Oral)   Wt 243 lb 12.8 oz (110.6 kg)   SpO2 97%   BMI 28.91 kg/m   Physical Exam Constitutional:      Appearance: Normal appearance.  HENT:     Head: Normocephalic.     Mouth/Throat:     Mouth: Mucous membranes are moist.  Eyes:     Extraocular Movements: Extraocular movements intact.     Pupils: Pupils are equal, round, and reactive to light.  Cardiovascular:     Rate and Rhythm: Normal rate and regular rhythm.     Pulses: Normal pulses.     Heart sounds: Normal heart sounds.  Pulmonary:     Effort: Pulmonary effort is normal.     Breath sounds: Normal breath sounds.  Abdominal:     General: Abdomen is flat. Bowel sounds are normal.     Palpations: Abdomen is soft.     Tenderness: There is no abdominal tenderness.  Musculoskeletal:        General: Normal range of motion.     Cervical back: Normal range of motion.  Skin:    General: Skin is warm.  Neurological:     General: No focal deficit present.     Mental Status: He is alert and oriented to person, place, and time.  Psychiatric:        Mood and Affect: Mood normal.        Behavior: Behavior normal.  Assessment & Plan:    Patient seen with Dr. Mliss Foot.   Constipation Chronic condition, worsening.  Patient states that his constipation over the last couple of weeks has been bothersome to him.  Last formed bowel movement last Wednesday with softer, pellet-like stools daily since. No tenderness to palpation of abdomen, normal bowel sounds on exam. Less likely to be obstruction.  He tends to take MiraLAX  at night, which he says normally helps him on top of his fiber intake and water intake, however, this has not been enough in the last few weeks. Recommended he increases his MiraLAX  dose to 2 packets, one in the morning and one at night, for the next few days to help soften his stools. Discussed if patient does not have a normal bowel movement, then to try Senna  8.6mg  over the counter to help evacuate bowel movement. Also recommended to add dates to his fiber intake that he can include in his morning routine. Addressed the importance that if his symptoms do not improve and he does not have any bowel movement in the next week to call the office to be seen sooner. Patient voiced understanding.  -Low FODMAP diet -Increase MiraLAX  dose to 2 packets daily  -Senna 8.6mg  OTC if no BM in the next 2-3 days  Erectile Dysfunction Patient asking for refill on the sildenafil .  He reports having no symptoms or side effects from his sildenafil  such as headaches, dizziness, or vision changes.  Will send to pharmacy. - Sildenafil  50mg  refill    Deleah Tison, DO Internal Medicine Resident, PGY-1 [facility] 4:26 PM 10/07/2023

## 2023-10-08 NOTE — Addendum Note (Signed)
 Addended by: Lilliane Sposito L on: 10/08/2023 11:03 AM   Modules accepted: Level of Service

## 2023-10-08 NOTE — Progress Notes (Signed)
 Internal Medicine Clinic Attending  I was physically present during the key portions of the resident provided service and participated in the medical decision making of patient's management care. I reviewed pertinent patient test results.  The assessment, diagnosis, and plan were formulated together and I agree with the documentation in the resident's note.  Lovie Clarity, MD    Today we addressed patient's constipation, ED, and hypertension.   I agree with plan to continue good hydration, eating fiber, and to double miralax  to twice daily, with addition of OTC Senna if constipation is not improving. He has no symptoms or signs of ileus or SBO.   He is currently taking Amlodipine  and Olmesartan  for hypertension, no longer on the hydrochlorothiazide 

## 2023-10-20 ENCOUNTER — Other Ambulatory Visit: Payer: Self-pay

## 2023-10-20 DIAGNOSIS — K219 Gastro-esophageal reflux disease without esophagitis: Secondary | ICD-10-CM

## 2023-10-20 MED ORDER — PANTOPRAZOLE SODIUM 40 MG PO TBEC: 40.0000 mg | DELAYED_RELEASE_TABLET | Freq: Two times a day (BID) | ORAL | 2 refills | Status: AC

## 2023-10-20 NOTE — Telephone Encounter (Signed)
 Medication sent to pharmacy

## 2023-10-21 ENCOUNTER — Other Ambulatory Visit: Payer: Self-pay

## 2023-10-21 ENCOUNTER — Ambulatory Visit (INDEPENDENT_AMBULATORY_CARE_PROVIDER_SITE_OTHER): Payer: Medicare (Managed Care)

## 2023-10-21 VITALS — BP 104/77 | HR 103 | Temp 98.2°F | Ht 77.0 in | Wt 242.8 lb

## 2023-10-21 DIAGNOSIS — R194 Change in bowel habit: Secondary | ICD-10-CM

## 2023-10-21 DIAGNOSIS — R140 Abdominal distension (gaseous): Secondary | ICD-10-CM | POA: Diagnosis not present

## 2023-10-21 DIAGNOSIS — Z79899 Other long term (current) drug therapy: Secondary | ICD-10-CM | POA: Diagnosis not present

## 2023-10-21 DIAGNOSIS — Z9079 Acquired absence of other genital organ(s): Secondary | ICD-10-CM

## 2023-10-21 DIAGNOSIS — Z23 Encounter for immunization: Secondary | ICD-10-CM

## 2023-10-21 NOTE — Assessment & Plan Note (Addendum)
 Patient has been dealing with this problem for about 6 months.  His predominant symptoms are bloating, feeling his stomach gurgle, periods of constipation and when he takes MiraLAX  he is only able to have loose watery stools.  Reports he has been taking MiraLAX  every morning with his coffee for several years to help with his bowel movements.  His bowels have been an issue for as long as he can remember but the bloating and gurgling is new.  There is no pain associated with the bloating or gurgling, there is no pain with defecation, and his abdominal symptoms are not relieved with defecation.  Patient does not eat grains, breads, or foods with gluten in them often and denies rash on extensor surfaces.  The only medications he can remember changing recently was his HIV regimen, about 6 months ago, to a combination pill including dolutegravir  and lamivudine.  His HIV has been well-controlled for decades.  The only abdominal surgery he has ever had is an appendectomy.  He is a healthy man, he eats a diverse diet including fruit, vegetables, and mostly fish for protein.  He very rarely eats red meat.  He is missing several teeth and wonders if his inability to properly chew has anything to do with this. At the last office visit it was recommended to increase MiraLAX  to twice daily and try senna if he is still unable to poop.  Patient has been doing this and taking senna and has had watery bowel movements, at least 1 a day, but has not had a hard formed stool.  He has not had improvement in the bloating or gurgling.  Patient has been on PPIs for several years, he has been taking pantoprazole  twice daily and he needs this otherwise he has heartburn.  He has also been on an HIV of the regimen for several years.  Patient is also on atorvastatin  daily. In this healthy 63 year old man with bloating, gurgling that is not associated with food, has symptoms of diarrhea and constipation, my differential includes but not limited  to medication associated diarrhea, IBS, small intestinal bacterial overgrowth, microscopic colitis, less likely celiac, less likely dietary intolerance. Plan to refer patient to GI as we are unable to do SIBO testing here in the office.  Will also have patient mention at his next ID appointment his symptoms, as HIV medications are commonly associated with GI upset.  Will have patient go back to using MiraLAX  once daily and stop using senna.  Continue healthy diet and exercise.

## 2023-10-21 NOTE — Patient Instructions (Addendum)
 It was wonderful seeing you today!   We talked about.SABRASABRA  1) your bowel discomfort. I recommend stopping Senna and drinking the Miralax  as needed. I also recommend bringing up your symptoms to [facility] at your next Infectious Disease appointment. Someone will call you to schedule an appointment with gastroenterology to further work up your symptoms.   If you have any questions please feel free to the call the clinic at anytime at [phone].  Have a blessed day,  Dr. Charmayne

## 2023-10-21 NOTE — Progress Notes (Signed)
 Established Patient Office Visit  Subjective   Patient ID: Chad Little, male    DOB: [birth year]  Age: 63 y.o. MRN: [id number]  Patient last seen on 9/2 for constipation and TURP complications.  Patient is continuing to have abdominal issues and is here to discuss.        Objective:     BP 104/77 (BP Location: Right Arm, Patient Position: Sitting, Cuff Size: Large)   Pulse (!) 103   Temp 98.2 F (36.8 C) (Oral)   Ht 6' 5 (1.956 m)   Wt 242 lb 12.8 oz (110.1 kg)   SpO2 100%   BMI 28.79 kg/m    Physical Exam Vitals reviewed.  Constitutional:      Appearance: Normal appearance.  HENT:     Nose: Nose normal.  Eyes:     Conjunctiva/sclera: Conjunctivae normal.  Pulmonary:     Effort: Pulmonary effort is normal.  Abdominal:     General: There is no distension.     Palpations: Abdomen is soft. There is no mass.     Tenderness: There is no abdominal tenderness. There is no guarding or rebound.     Hernia: No hernia is present.  Skin:    General: Skin is warm.     Comments: BL varicose veins  Neurological:     General: No focal deficit present.     Mental Status: He is alert and oriented to person, place, and time.  Psychiatric:        Mood and Affect: Mood normal.        Behavior: Behavior normal.      No results found for any visits on 10/21/23.    The ASCVD Risk score (Arnett DK, et al., 2019) failed to calculate for the following reasons:   The valid total cholesterol range is 130 to 320 mg/dL    Assessment & Plan:   Assessment & Plan Abdominal bloating Patient has been dealing with this problem for about 6 months.  His predominant symptoms are bloating, feeling his stomach gurgle, periods of constipation and when he takes MiraLAX  he is only able to have loose watery stools.  Reports he has been taking MiraLAX  every morning with his coffee for several years to help with his bowel movements.  His bowels have been an issue for as long as he can  remember but the bloating and gurgling is new.  There is no pain associated with the bloating or gurgling, there is no pain with defecation, and his abdominal symptoms are not relieved with defecation.  Patient does not eat grains, breads, or foods with gluten in them often and denies rash on extensor surfaces.  The only medications he can remember changing recently was his HIV regimen, about 6 months ago, to a combination pill including dolutegravir  and lamivudine.  His HIV has been well-controlled for decades.  The only abdominal surgery he has ever had is an appendectomy.  He is a healthy man, he eats a diverse diet including fruit, vegetables, and mostly fish for protein.  He very rarely eats red meat.  He is missing several teeth and wonders if his inability to properly chew has anything to do with this. At the last office visit it was recommended to increase MiraLAX  to twice daily and try senna if he is still unable to poop.  Patient has been doing this and taking senna and has had watery bowel movements, at least 1 a day, but has not had a hard formed  stool.  He has not had improvement in the bloating or gurgling.  Patient has been on PPIs for several years, he has been taking pantoprazole  twice daily and he needs this otherwise he has heartburn.  He has also been on an HIV of the regimen for several years.  Patient is also on atorvastatin  daily. In this healthy 63 year old man with bloating, gurgling that is not associated with food, has symptoms of diarrhea and constipation, my differential includes but not limited to medication associated diarrhea, IBS, small intestinal bacterial overgrowth, microscopic colitis, less likely celiac, less likely dietary intolerance. Plan to refer patient to GI as we are unable to do SIBO testing here in the office.  Will also have patient mention at his next ID appointment his symptoms, as HIV medications are commonly associated with GI upset.  Will have patient go back  to using MiraLAX  once daily and stop using senna.  Continue healthy diet and exercise.    S/P TURP Patient is experiencing anejaculation after his TURP procedure.  Of note, the patient is not trying to conceive nor has any plans to have more children in the future. Referral was placed at his last visit to urology but unable to schedule due to insurance. We considered placing another referral today. Although after discussing with the patient that this is an expected adverse effect from the procedure, the patient does not feel like he needs a referral after all.  This is our recommendation as well.    Encounter for immunization Given today Orders:   Flu vaccine HIGH DOSE PF(Fluzone Trivalent)   Return if symptoms worsen or fail to improve.    Viktoria King, DO

## 2023-10-21 NOTE — Assessment & Plan Note (Addendum)
 Patient is experiencing anejaculation after his TURP procedure.  Of note, the patient is not trying to conceive nor has any plans to have more children in the future. Referral was placed at his last visit to urology but unable to schedule due to insurance. We considered placing another referral today. Although after discussing with the patient that this is an expected adverse effect from the procedure, the patient does not feel like he needs a referral after all.  This is our recommendation as well.

## 2023-10-23 ENCOUNTER — Telehealth: Payer: Self-pay | Admitting: Family

## 2023-10-23 NOTE — Telephone Encounter (Signed)
 Resident speaking with now - thank you!

## 2023-10-23 NOTE — Telephone Encounter (Signed)
 [facility] called to schedule the next available appt with Cathlyn. He stated that he read the Dovato  label and noticed a conflict with taking it alongside laxatives. The patient uses Miralax  daily and believes this may be causing his discomfort. Pt would like to discuss options with Cathlyn and is scheduled 10/8.

## 2023-10-23 NOTE — Telephone Encounter (Signed)
 Spoke with Chad Little over the phone in regards to his concern with taking Miralax  and Dovato  together. Counseled him that only cation-containing laxatives, such as magnesium citrate, interact with Dovato . Ensured him that it is okay to take MiraLax  and Dovato  in the same day.   He was concerned that the interaction was causing his stomach to make loud sounds. Discussed that this symptom would not occur due take MiraLax  and Dovato , and he should continue both medications.   Woodie Jock, PharmD PGY1 Pharmacy Resident  10/23/2023

## 2023-10-27 NOTE — Progress Notes (Signed)
 Internal Medicine Clinic Attending  I was physically present during the key portions of the resident provided service and participated in the medical decision making of patient's management care. I reviewed pertinent patient test results.  The assessment, diagnosis, and plan were formulated together and I agree with the documentation in the resident's note.  Chad Clarity, MD    Patient has had alternating constipation & diarrhea for years, associated with abdominal bloating & discomfort. To me, it sounds like IBS, but SIBO and medication side effects are also on differential diagnosis. I agree with referral to GI for consideration of SIBO testing and EGD.

## 2023-11-03 ENCOUNTER — Other Ambulatory Visit: Payer: Self-pay | Admitting: Student

## 2023-11-03 DIAGNOSIS — J302 Other seasonal allergic rhinitis: Secondary | ICD-10-CM

## 2023-11-03 NOTE — Telephone Encounter (Signed)
 Medication sent to pharmacy

## 2023-11-12 ENCOUNTER — Other Ambulatory Visit: Payer: Self-pay

## 2023-11-12 ENCOUNTER — Ambulatory Visit: Payer: Medicare (Managed Care) | Admitting: Family

## 2023-11-12 MED ORDER — ATORVASTATIN CALCIUM 10 MG PO TABS: 10.0000 mg | ORAL_TABLET | Freq: Every day | ORAL | 11 refills | Status: AC

## 2023-11-12 NOTE — Telephone Encounter (Signed)
 Medication sent to pharmacy

## 2023-11-13 MED ORDER — TRIAMCINOLONE ACETONIDE 55 MCG/ACT NA AERO: 1.0000 | INHALATION_SPRAY | Freq: Every day | NASAL | 12 refills | Status: AC

## 2023-11-13 NOTE — Telephone Encounter (Signed)
 Insurance request for alternative to fluticasone .  Ordered triamcinolone  nasal spray as this appears to be preferred on his insurance.

## 2023-11-13 NOTE — Addendum Note (Signed)
 Addended by: Evoleth Nordmeyer on: 11/13/2023 11:45 AM   Modules accepted: Orders

## 2023-11-17 ENCOUNTER — Other Ambulatory Visit: Payer: Self-pay

## 2023-11-17 ENCOUNTER — Other Ambulatory Visit: Payer: Medicare (Managed Care)

## 2023-11-17 DIAGNOSIS — Z113 Encounter for screening for infections with a predominantly sexual mode of transmission: Secondary | ICD-10-CM

## 2023-11-17 DIAGNOSIS — B20 Human immunodeficiency virus [HIV] disease: Secondary | ICD-10-CM

## 2023-11-18 LAB — T-HELPER CELL (CD4) - (RCID CLINIC ONLY)
CD4 % Helper T Cell: 38 % (ref 33–65)
CD4 T Cell Abs: 618 /uL (ref 400–1790)

## 2023-11-19 LAB — COMPLETE METABOLIC PANEL WITHOUT GFR
AG Ratio: 1.7 (calc) (ref 1.0–2.5)
ALT: 21 U/L (ref 9–46)
AST: 20 U/L (ref 10–35)
Albumin: 4.9 g/dL (ref 3.6–5.1)
Alkaline phosphatase (APISO): 92 U/L (ref 35–144)
BUN: 15 mg/dL (ref 7–25)
CO2: 28 mmol/L (ref 20–32)
Calcium: 10.4 mg/dL — ABNORMAL HIGH (ref 8.6–10.3)
Chloride: 102 mmol/L (ref 98–110)
Creat: 1.32 mg/dL (ref 0.70–1.35)
Globulin: 2.9 g/dL (ref 1.9–3.7)
Glucose, Bld: 122 mg/dL — ABNORMAL HIGH (ref 65–99)
Potassium: 4.5 mmol/L (ref 3.5–5.3)
Sodium: 138 mmol/L (ref 135–146)
Total Bilirubin: 0.7 mg/dL (ref 0.2–1.2)
Total Protein: 7.8 g/dL (ref 6.1–8.1)

## 2023-11-19 LAB — CBC WITH DIFFERENTIAL/PLATELET
Absolute Lymphocytes: 1843 {cells}/uL (ref 850–3900)
Absolute Monocytes: 710 {cells}/uL (ref 200–950)
Basophils Absolute: 67 {cells}/uL (ref 0–200)
Basophils Relative: 1 %
Eosinophils Absolute: 228 {cells}/uL (ref 15–500)
Eosinophils Relative: 3.4 %
HCT: 53.7 % — ABNORMAL HIGH (ref 38.5–50.0)
Hemoglobin: 18.4 g/dL — ABNORMAL HIGH (ref 13.2–17.1)
MCH: 33 pg (ref 27.0–33.0)
MCHC: 34.3 g/dL (ref 32.0–36.0)
MCV: 96.4 fL (ref 80.0–100.0)
MPV: 10.4 fL (ref 7.5–12.5)
Monocytes Relative: 10.6 %
Neutro Abs: 3853 {cells}/uL (ref 1500–7800)
Neutrophils Relative %: 57.5 %
Platelets: 269 Thousand/uL (ref 140–400)
RBC: 5.57 Million/uL (ref 4.20–5.80)
RDW: 12.9 % (ref 11.0–15.0)
Total Lymphocyte: 27.5 %
WBC: 6.7 Thousand/uL (ref 3.8–10.8)

## 2023-11-19 LAB — RPR: RPR Ser Ql: NONREACTIVE

## 2023-11-19 LAB — HIV-1 RNA QUANT-NO REFLEX-BLD
HIV 1 RNA Quant: 24 {copies}/mL — ABNORMAL HIGH
HIV-1 RNA Quant, Log: 1.38 {Log_copies}/mL — ABNORMAL HIGH

## 2023-11-21 ENCOUNTER — Other Ambulatory Visit (HOSPITAL_COMMUNITY): Payer: Self-pay

## 2023-12-08 ENCOUNTER — Ambulatory Visit: Payer: Medicare (Managed Care) | Admitting: Family

## 2023-12-08 ENCOUNTER — Encounter: Payer: Self-pay | Admitting: Family

## 2023-12-08 ENCOUNTER — Other Ambulatory Visit: Payer: Self-pay

## 2023-12-08 VITALS — BP 131/86 | HR 76 | Temp 98.0°F | Ht 77.0 in | Wt 240.0 lb

## 2023-12-08 DIAGNOSIS — B20 Human immunodeficiency virus [HIV] disease: Secondary | ICD-10-CM | POA: Diagnosis not present

## 2023-12-08 DIAGNOSIS — Z79899 Other long term (current) drug therapy: Secondary | ICD-10-CM

## 2023-12-08 DIAGNOSIS — Z0000 Encounter for general adult medical examination without abnormal findings: Secondary | ICD-10-CM | POA: Diagnosis not present

## 2023-12-08 MED ORDER — DOVATO 50-300 MG PO TABS: 1.0000 | ORAL_TABLET | Freq: Every day | ORAL | 5 refills | Status: AC

## 2023-12-08 NOTE — Patient Instructions (Addendum)
 Nice to see you.  Continue to take your medication daily as prescribed.  Refills have been sent to the pharmacy.  Plan for follow up in 9 months or sooner if needed with lab work on the same day.  Have a great day and stay safe!

## 2023-12-08 NOTE — Progress Notes (Addendum)
 Patient: Chad Little  DOB: April 15, 1960 MRN: [id number] PCP: Marylu Gee, DO    Reason for Visit: 9 month follow up   Chief Complaint  Patient presents with   Follow-up    B20      Subjective   Subjective:   Chad Little is a 63 year old male here in clinic today for 9 month follow up. He was last seen on 03/25/2023. Last HIV-1 RNA 24 and CD4 618. Since his last visit he reports no complaints at this time. He reports daily adherence to Dovato  with no side effects. He reports no barriers in obtaining his medication. Since his last visit, he reports no recent hospitalizations or ER visits. He states he has an upcoming appointment with GI tomorrow. He reports being sexually active and using condoms. He reports being sexually active with condoms and reports no concerns. He is not interested in STD labs. 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 Preventive care and immunizations reviewed and up to date. He denies fever, chills, night sweats, chest pain, shortness of breath and no new skin nodules or rashes.   Review of Systems  Constitutional:  Negative for chills, fever and weight loss.  Respiratory:  Negative for cough, shortness of breath and wheezing.   Cardiovascular:  Negative for chest pain and leg swelling.  Gastrointestinal:  Negative for abdominal pain, constipation, diarrhea, nausea and vomiting.  Skin:  Negative for rash.    Past Medical History:  Diagnosis Date   Arthritis    Enlarged prostate    Essential hypertension    GERD (gastroesophageal reflux disease)    Gout    diet controlled no recent flare ups   HIV infection (HCC)    Superficial vein thrombosis 08/08/2020    Outpatient Medications Prior to Visit  Medication Sig Dispense Refill   atorvastatin  (LIPITOR) 10 MG tablet Take 1 tablet (10 mg total) by mouth daily. 30 tablet 11   betamethasone  dipropionate 0.05 %  cream Apply topically 2 (two) times daily. For 2 weeks. Do not use on face. 30 g 0   clotrimazole  (LOTRIMIN ) 1 % cream Apply 1 Application topically 2 (two) times daily. 30 g 0   empagliflozin  (JARDIANCE ) 10 MG TABS tablet Take 1 tablet (10 mg total) by mouth daily before breakfast. 30 tablet 6   fluticasone  (FLONASE ) 50 MCG/ACT nasal spray SHAKE LIQUID AND USE 2 SPRAYS IN EACH NOSTRIL TWICE DAILY 96 g 1   ketoconazole  (NIZORAL ) 2 % cream Apply 1 Application topically 2 (two) times daily. 60 g 0   olmesartan  (BENICAR ) 40 MG tablet Take 1 tablet (40 mg total) by mouth daily. 30 tablet 11   pantoprazole  (PROTONIX ) 40 MG tablet Take 1 tablet (40 mg total) by mouth 2 (two) times daily. 180 tablet 2   polyethylene glycol (MIRALAX ) 17 g packet Take 17 g by mouth daily. 14 each 0   sildenafil  (VIAGRA ) 50 MG tablet Take 1 tablet (50 mg total) by mouth as needed for erectile dysfunction. 20 tablet 3   triamcinolone  (NASACORT ) 55 MCG/ACT AERO nasal inhaler Place 1 spray into the nose daily. 1 each 12   triamcinolone  cream (KENALOG ) 0.1 % Apply 1 Application topically 2 (two) times daily. 30 g 0   DOVATO  50-300 MG tablet TAKE 1 TABLET BY MOUTH DAILY 30 tablet 5   No facility-administered medications prior to visit.     No Known Allergies  Social History   Tobacco Use   Smoking status: Never    Passive exposure: Never   Smokeless tobacco: Never  Vaping Use   Vaping status: Never Used  Substance Use Topics   Alcohol use: Not Currently    Alcohol/week: 0.0 standard drinks of alcohol    Comment: none since 2017   Drug use: Not Currently    Types: Marijuana, Crack cocaine    Comment:  cocaine and marijuana none since 2017    Family History  Problem Relation Age of Onset   Alcohol abuse Mother    Hypertension Father    Stroke Father        Objective   Objective:   Vitals:   12/08/23 0942  BP: 131/86  Pulse: 76  Temp: 98 F (36.7 C)  TempSrc: Oral  SpO2: 96%  Weight: 240 lb  (108.9 kg)  Height: 6' 5 (1.956 m)   Body mass index is 28.46 kg/m.  Physical Exam Constitutional:      General: He is not in acute distress.    Appearance: Normal appearance. He is well-developed.  Eyes:     Conjunctiva/sclera: Conjunctivae normal.  Cardiovascular:     Rate and Rhythm: Normal rate and regular rhythm.     Heart sounds: Normal heart sounds. No murmur heard.    No friction rub. No gallop.  Pulmonary:     Effort: Pulmonary effort is normal. No respiratory distress.     Breath sounds: Normal breath sounds. No wheezing or rales.  Chest:     Chest wall: No tenderness.  Abdominal:     General: Bowel sounds are normal.     Palpations: Abdomen is soft.     Tenderness: There is no abdominal tenderness.  Musculoskeletal:        General: Normal range of motion.     Cervical back: Neck supple.  Lymphadenopathy:     Cervical: No cervical adenopathy.  Skin:    General: Skin is warm and dry.     Findings: No rash.  Neurological:     Mental Status: He is alert and oriented to person, place, and time.        Assessment & Plan:   Problem List Items Addressed This Visit       Other   HIV disease (HCC) (Chronic)   Relevant Medications   dolutegravir -lamiVUDine (DOVATO ) 50-300 MG tablet    Assessment and Plan  -HIV disease  Current ART Dovato . HIV-1 24 and CD4 618. Reports good adherence and no side effects. Continue with regimen.   -Health Maintenance  Offered STD screening, he denies at this time. Reports no symptoms and states he uses condoms. Immunizations reviewed, will be able to get PCV 20 09/2024. Preventive care reviewed and up to date.      No orders of the defined types were placed in this encounter.   Meds ordered this encounter  Medications   dolutegravir -lamiVUDine (DOVATO ) 50-300 MG tablet    Sig: Take 1 tablet by mouth daily.    Dispense:  30 tablet    Refill:  5    Supervising Provider:   SNIDER, CYNTHIA [phone]    Prescription  Type::   Renewal    Return in about 9 months (around 09/06/2024).   I have seen Chad Little and agree with the plan of care as written by Chad Little with additions added as needed.    Chad Pelham Hennick, NP 12/08/2023 11:47 AM

## 2023-12-08 NOTE — Progress Notes (Deleted)
 Brief Narrative   Patient ID: Chad Little, male    DOB: 1960/10/02, 63 y.o.   MRN: [id number]  Chad Little is a 63 year old African-American male diagnosed with HIV-1 in 1998 with risk factor heterosexual contact.  Initial viral load and CD4 count unavailable.  Genotype from 2009 with no significant medication resistance patterns.  No history of opportunistic infection. [id number] negative. ART regimen of Tivicay  and Descovy .    Subjective:   Chief Complaint  Patient presents with   Follow-up    B20    HPI:  Chad Little is a 63 y.o. male with HIV disease last seen on 03/25/2023 with well-controlled virus and good adherence and tolerance to Dovato .  Viral load was undetectable with CD4 count 727.  Renal function slightly declined with creatinine of 1.55 and estimated GFR 50 consistent with chronic kidney disease stage IIIa.  Liver function, electrolytes within normal ranges.  Lipid profile with triglycerides 45, LDL 44, and HDL 55.  Most recent lab work on 11/17/23 with undetectable viral load and CD4 count  978.  Needed on 11/17/2023 with viral load that is undetectable and CD4 count 618.  Kidney function improved with normal liver function and electrolytes.  Here today for routine follow-up.    Has dentures   Denies fevers, chills, night sweats, headaches, changes in vision, neck pain/stiffness, nausea, diarrhea, vomiting, lesions or rashes.  Lab Results  Component Value Date   CD4TCELL 38 11/17/2023   CD4TABS 618 11/17/2023   Lab Results  Component Value Date   HIV1RNAQUANT 24 (H) 11/17/2023     No Known Allergies    Outpatient Medications Prior to Visit  Medication Sig Dispense Refill   atorvastatin  (LIPITOR) 10 MG tablet Take 1 tablet (10 mg total) by mouth daily. 30 tablet 11   betamethasone  dipropionate 0.05 % cream Apply topically 2 (two) times daily. For 2 weeks. Do not use on face. 30 g 0   clotrimazole  (LOTRIMIN ) 1 % cream Apply 1 Application  topically 2 (two) times daily. 30 g 0   DOVATO  50-300 MG tablet TAKE 1 TABLET BY MOUTH DAILY 30 tablet 5   empagliflozin  (JARDIANCE ) 10 MG TABS tablet Take 1 tablet (10 mg total) by mouth daily before breakfast. 30 tablet 6   fluticasone  (FLONASE ) 50 MCG/ACT nasal spray SHAKE LIQUID AND USE 2 SPRAYS IN EACH NOSTRIL TWICE DAILY 96 g 1   ketoconazole  (NIZORAL ) 2 % cream Apply 1 Application topically 2 (two) times daily. 60 g 0   olmesartan  (BENICAR ) 40 MG tablet Take 1 tablet (40 mg total) by mouth daily. 30 tablet 11   pantoprazole  (PROTONIX ) 40 MG tablet Take 1 tablet (40 mg total) by mouth 2 (two) times daily. 180 tablet 2   polyethylene glycol (MIRALAX ) 17 g packet Take 17 g by mouth daily. 14 each 0   sildenafil  (VIAGRA ) 50 MG tablet Take 1 tablet (50 mg total) by mouth as needed for erectile dysfunction. 20 tablet 3   triamcinolone  (NASACORT ) 55 MCG/ACT AERO nasal inhaler Place 1 spray into the nose daily. 1 each 12   triamcinolone  cream (KENALOG ) 0.1 % Apply 1 Application topically 2 (two) times daily. 30 g 0   No facility-administered medications prior to visit.     Past Medical History:  Diagnosis Date   Arthritis    Enlarged prostate    Essential hypertension    GERD (gastroesophageal reflux disease)    Gout    diet controlled no recent flare ups  HIV infection (HCC)    Superficial vein thrombosis 08/08/2020     Past Surgical History:  Procedure Laterality Date   APPENDECTOMY  2005   open   Right elbow surgery  dec3 2020   in [place]   TRANSURETHRAL RESECTION OF PROSTATE N/A 02/14/2020   Procedure: TRANSURETHRAL RESECTION OF THE PROSTATE (TURP), BIPOLAR;  Surgeon: Selma Donnice SAUNDERS, MD;  Location: [facility];  Service: Urology;  Laterality: N/A;        Review of Systems  Constitutional:  Negative for appetite change, chills, fatigue, fever and unexpected weight change.  Eyes:  Negative for visual disturbance.  Respiratory:  Negative for cough,  chest tightness, shortness of breath and wheezing.   Cardiovascular:  Negative for chest pain and leg swelling.  Gastrointestinal:  Negative for abdominal pain, constipation, diarrhea, nausea and vomiting.  Genitourinary:  Negative for dysuria, flank pain, frequency, genital sores, hematuria and urgency.  Skin:  Negative for rash.  Allergic/Immunologic: Negative for immunocompromised state.  Neurological:  Negative for dizziness and headaches.     Objective:   BP 131/86   Pulse 76   Temp 98 F (36.7 C) (Oral)   Ht 6' 5 (1.956 m)   Wt 240 lb (108.9 kg)   SpO2 96%   BMI 28.46 kg/m  Nursing note and vital signs reviewed.  Physical Exam Constitutional:      General: He is not in acute distress.    Appearance: He is well-developed.  Eyes:     Conjunctiva/sclera: Conjunctivae normal.  Cardiovascular:     Rate and Rhythm: Normal rate and regular rhythm.     Heart sounds: Normal heart sounds. No murmur heard.    No friction rub. No gallop.  Pulmonary:     Effort: Pulmonary effort is normal. No respiratory distress.     Breath sounds: Normal breath sounds. No wheezing or rales.  Chest:     Chest wall: No tenderness.  Abdominal:     General: Bowel sounds are normal.     Palpations: Abdomen is soft.     Tenderness: There is no abdominal tenderness.  Musculoskeletal:     Cervical back: Neck supple.  Lymphadenopathy:     Cervical: No cervical adenopathy.  Skin:    General: Skin is warm and dry.     Findings: No rash.  Neurological:     Mental Status: He is alert and oriented to person, place, and time.          12/08/2023    9:45 AM 10/21/2023    3:09 PM 09/04/2023    9:41 AM 08/20/2023    1:47 PM 07/02/2023    9:40 AM  Depression screen PHQ 2/9  Decreased Interest 0 0 0 0 0  Down, Depressed, Hopeless 0 0 0 0 0  PHQ - 2 Score 0 0 0 0 0  Altered sleeping 0      Tired, decreased energy 0      Change in appetite 0      Feeling bad or failure about yourself  0       Trouble concentrating 0      Moving slowly or fidgety/restless 0      Suicidal thoughts 0      PHQ-9 Score 0            12/08/2023    9:45 AM  GAD 7 : Generalized Anxiety Score  Nervous, Anxious, on Edge 0  Control/stop worrying 0  Worry too much - different things  0  Trouble relaxing 0  Restless 0  Easily annoyed or irritable 0  Afraid - awful might happen 0  Total GAD 7 Score 0     The ASCVD Risk score (Arnett DK, et al., 2019) failed to calculate for the following reasons:   The valid total cholesterol range is 130 to 320 mg/dL      Assessment & Plan:    Patient Active Problem List   Diagnosis Date Noted   Abdominal bloating 04/18/2023   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   Chronic kidney disease, stage 3a (HCC) 03/26/2023   Venous stasis dermatitis of left lower extremity 03/12/2023   Chronic idiopathic constipation 03/12/2023   Bacterial sinusitis 11/07/2022   Tinea corporis 08/22/2022   Rash 07/02/2022   Seborrheic dermatitis 12/21/2021   Blurry vision, left eye 09/20/2021   Erectile dysfunction 08/15/2021   Varicose veins of left lower extremity 08/10/2021   Arthritis of carpometacarpal (CMC) joint of left thumb 07/13/2021   Nail dystrophy 06/08/2021   Thoracic back pain 05/02/2021   BPPV (benign paroxysmal positional vertigo), bilateral 10/23/2020   Superficial vein thrombosis 08/08/2020   Seasonal allergies 07/06/2020   GERD (gastroesophageal reflux disease) 06/13/2020   S/P TURP 03/22/2020   Healthcare maintenance 05/25/2019   Prediabetes 04/14/2019   Episodic tension-type headache, not intractable 09/30/2015   Hypertension 10/31/2014   Onychomycosis 12/11/2011   HIV disease (HCC) 12/28/2007   Osteoarthritis 12/28/2007     Problem List Items Addressed This Visit   None    I am having Lonni EMERSON Amil Medford maintain his triamcinolone  cream, clotrimazole , polyethylene glycol, ketoconazole , betamethasone  dipropionate,  olmesartan , empagliflozin , Dovato , sildenafil , pantoprazole , fluticasone , atorvastatin , and triamcinolone .   No orders of the defined types were placed in this encounter.    Follow-up: No follow-ups on file. or sooner if needed.    Cathlyn July, MSN, FNP-C Nurse Practitioner [facility] Main number: [phone]

## 2023-12-30 ENCOUNTER — Ambulatory Visit: Payer: Self-pay

## 2023-12-30 NOTE — Telephone Encounter (Addendum)
 First attempt to contact patient, no answer, voicemail left for patient to call back, routing for additional attempts.       Summary: [phone] [name] ear pain,sinus, vertigo   Reason for Triage:Sharp ear pain right ear (intermittent) notices more when lying down, vertigo X's one week,  and sinus. Is needing to speak with someone to advise him

## 2023-12-30 NOTE — Telephone Encounter (Signed)
 FYI Only or Action Required?: FYI only for provider: appointment scheduled on 11/26.   Patient was last seen in primary care on 10/21/2023 by Charmayne Holmes, DO.   Called Nurse Triage reporting Otalgia.   Symptoms began A couple of months ago.   Symptoms are: gradually worsening.   Triage Disposition: See Physician Within 24 Hours   Patient/caregiver understands and will follow disposition?: Yes      Reason for Disposition  Earache  (Exceptions: Brief ear pain of lasting less than 60 minutes, or earache occurring during air travel.)  Answer Assessment - Initial Assessment Questions 1. LOCATION: Which ear is involved?     Right ear  2. ONSET: When did the ear pain start?      A couple of months  3. SEVERITY: How bad is the pain?  (Scale 1-10; mild, moderate or severe)     Sharp  4. URI SYMPTOMS: Do you have a runny nose or cough?     Sinus congestion  5. FEVER: Do you have a fever? If Yes, ask: What is your temperature, how was it measured, and when did it start?     No 6. CAUSE: Have you been swimming recently?, How often do you use Q-TIPS?, Have you had any recent air travel or scuba diving?     Unsure  7. OTHER SYMPTOMS: Do you have any other symptoms? (e.g., decreased hearing, dizziness, headache, stiff neck, vomiting)     Intermittent dizziness with history of vertigo  Protocols used: Earache-A-AH

## 2023-12-30 NOTE — Telephone Encounter (Signed)
 PAS attempted to transfer patient and patient not on line.  Will place encounter back in call back folder

## 2023-12-31 ENCOUNTER — Ambulatory Visit: Payer: Medicare (Managed Care) | Admitting: Student

## 2023-12-31 VITALS — BP 139/86 | HR 75 | Temp 98.0°F | Ht 72.5 in | Wt 239.4 lb

## 2023-12-31 DIAGNOSIS — R7303 Prediabetes: Secondary | ICD-10-CM

## 2023-12-31 DIAGNOSIS — H8113 Benign paroxysmal vertigo, bilateral: Secondary | ICD-10-CM

## 2023-12-31 DIAGNOSIS — B20 Human immunodeficiency virus [HIV] disease: Secondary | ICD-10-CM

## 2023-12-31 DIAGNOSIS — E785 Hyperlipidemia, unspecified: Secondary | ICD-10-CM | POA: Insufficient documentation

## 2023-12-31 DIAGNOSIS — N1831 Chronic kidney disease, stage 3a: Secondary | ICD-10-CM

## 2023-12-31 LAB — POCT GLYCOSYLATED HEMOGLOBIN (HGB A1C): HbA1c, POC (controlled diabetic range): 6 % (ref 0.0–7.0)

## 2023-12-31 LAB — GLUCOSE, CAPILLARY: Glucose-Capillary: 110 mg/dL — ABNORMAL HIGH (ref 70–99)

## 2023-12-31 NOTE — Assessment & Plan Note (Signed)
 Continue Benicar  and Jardiance .

## 2023-12-31 NOTE — Patient Instructions (Addendum)
 Call GI and ask about whether they think a Hydrogen/Methane breath test for Small Intestinal Bacterial Overgrowth would be useful?

## 2023-12-31 NOTE — Progress Notes (Signed)
 CC: Follow-up  HPI:  [name] is a 63 y.o. male living with a history stated below and presents today for follow-up. Please see problem based assessment and plan for additional details.  Past Medical History:  Diagnosis Date   Arthritis    Enlarged prostate    Essential hypertension    GERD (gastroesophageal reflux disease)    Gout    diet controlled no recent flare ups   HIV infection (HCC)    Superficial vein thrombosis 08/08/2020    Current Outpatient Medications on File Prior to Visit  Medication Sig Dispense Refill   atorvastatin  (LIPITOR) 10 MG tablet Take 1 tablet (10 mg total) by mouth daily. 30 tablet 11   betamethasone  dipropionate 0.05 % cream Apply topically 2 (two) times daily. For 2 weeks. Do not use on face. 30 g 0   clotrimazole  (LOTRIMIN ) 1 % cream Apply 1 Application topically 2 (two) times daily. 30 g 0   dolutegravir -lamiVUDine (DOVATO ) 50-300 MG tablet Take 1 tablet by mouth daily. 30 tablet 5   empagliflozin  (JARDIANCE ) 10 MG TABS tablet Take 1 tablet (10 mg total) by mouth daily before breakfast. 30 tablet 6   fluticasone  (FLONASE ) 50 MCG/ACT nasal spray SHAKE LIQUID AND USE 2 SPRAYS IN EACH NOSTRIL TWICE DAILY 96 g 1   ketoconazole  (NIZORAL ) 2 % cream Apply 1 Application topically 2 (two) times daily. 60 g 0   olmesartan  (BENICAR ) 40 MG tablet Take 1 tablet (40 mg total) by mouth daily. 30 tablet 11   pantoprazole  (PROTONIX ) 40 MG tablet Take 1 tablet (40 mg total) by mouth 2 (two) times daily. 180 tablet 2   polyethylene glycol (MIRALAX ) 17 g packet Take 17 g by mouth daily. 14 each 0   sildenafil  (VIAGRA ) 50 MG tablet Take 1 tablet (50 mg total) by mouth as needed for erectile dysfunction. 20 tablet 3   triamcinolone  (NASACORT ) 55 MCG/ACT AERO nasal inhaler Place 1 spray into the nose daily. 1 each 12   triamcinolone  cream (KENALOG ) 0.1 % Apply 1 Application topically 2 (two) times daily. 30 g 0   No current facility-administered  medications on file prior to visit.    Family History  Problem Relation Age of Onset   Alcohol abuse Mother    Hypertension Father    Stroke Father     Social History   Socioeconomic History   Marital status: Single    Spouse name: Not on file   Number of children: Not on file   Years of education: Not on file   Highest education level: Not on file  Occupational History   Not on file  Tobacco Use   Smoking status: Never    Passive exposure: Never   Smokeless tobacco: Never  Vaping Use   Vaping status: Never Used  Substance and Sexual Activity   Alcohol use: Not Currently    Alcohol/week: 0.0 standard drinks of alcohol    Comment: none since 2017   Drug use: Not Currently    Types: Marijuana, Crack cocaine    Comment:  cocaine and marijuana none since 2017   Sexual activity: Not Currently    Comment: refused  condoms  Other Topics Concern   Not on file  Social History Narrative   Not on file   Social Drivers of Health   Financial Resource Strain: Not on file  Food Insecurity: No Food Insecurity (09/04/2023)   Hunger Vital Sign    Worried About Running Out of Food in the  Last Year: Never true    Ran Out of Food in the Last Year: Never true  Transportation Needs: No Transportation Needs (09/04/2023)   PRAPARE - Administrator, Civil Service (Medical): No    Lack of Transportation (Non-Medical): No  Physical Activity: Not on file  Stress: Not on file  Social Connections: Moderately Isolated (03/05/2022)   Social Connection and Isolation Panel    Frequency of Communication with Friends and Family: More than three times a week    Frequency of Social Gatherings with Friends and Family: More than three times a week    Attends Religious Services: More than 4 times per year    Active Member of [employer] or Organizations: No    Attends Banker Meetings: Never    Marital Status: Never married  Intimate Partner Violence: Not At Risk (09/04/2023)    Humiliation, Afraid, Rape, and Kick questionnaire    Fear of Current or Ex-Partner: No    Emotionally Abused: No    Physically Abused: No    Sexually Abused: No    Review of Systems: ROS negative except for what is noted on the assessment and plan.  Vitals:   12/31/23 0903  BP: 139/86  Pulse: 75  Temp: 98 F (36.7 C)  TempSrc: Oral  SpO2: 98%  Weight: 239 lb 6.4 oz (108.6 kg)  Height: 6' 0.5 (1.842 m)    Physical Exam: Constitutional: well-appearing, sitting in chair, in no acute distress HENT: bilateral tympanic membranes without effusion, no vertical or horizontal nystagmus  Cardiovascular: regular rate and rhythm, no m/r/g Pulmonary/Chest: normal work of breathing on room air, lungs clear to auscultation bilaterally Abdominal: soft, non-tender, non-distended MSK: normal bulk and tone Skin: warm and dry Psych: normal mood and behavior  Assessment & Plan:  Patient discussed with Dr. Trudy  HIV disease [facility]) Follows with ID. Continue Dovato .  Hyperlipidemia LDL was 44 03/2023.  Continue Lipitor 10.  Chronic kidney disease, stage 3a (HCC) Continue Benicar  and Jardiance .  Prediabetes A1c today is 6.0. Continue Jardiance  and lifestyle modifications which I further reinforced today.   BPPV (benign paroxysmal positional vertigo), bilateral Symptoms have persisted over the last few months. Describes positional dizziness. Prescribed Meclizine  in the past but he did not like the way it made him feel. Physical exam unremarkable. Will send referral for vestibular rehab.    Norman Lobstein, D.O. [facility] Internal Medicine, PGY-2 Phone: [phone] Date 01/05/2024 Time 10:48 AM

## 2023-12-31 NOTE — Assessment & Plan Note (Signed)
 Follows with ID. Continue Dovato .

## 2023-12-31 NOTE — Assessment & Plan Note (Signed)
 LDL was 44 03/2023.  Continue Lipitor 10.

## 2023-12-31 NOTE — Assessment & Plan Note (Addendum)
 A1c today is 6.0. Continue Jardiance  and lifestyle modifications which I further reinforced today.

## 2024-01-05 NOTE — Assessment & Plan Note (Signed)
 Symptoms have persisted over the last few months. Describes positional dizziness. Prescribed Meclizine  in the past but he did not like the way it made him feel. Physical exam unremarkable. Will send referral for vestibular rehab.

## 2024-01-15 NOTE — Progress Notes (Signed)
 Internal Medicine Clinic Attending  Case discussed with the resident at the time of the visit.  We reviewed the resident's history and exam and pertinent patient test results.  I agree with the assessment, diagnosis, and plan of care documented in the resident's note.

## 2024-01-22 ENCOUNTER — Other Ambulatory Visit (HOSPITAL_COMMUNITY): Payer: Self-pay

## 2024-02-03 ENCOUNTER — Other Ambulatory Visit: Payer: Self-pay | Admitting: *Deleted

## 2024-02-03 DIAGNOSIS — I10 Essential (primary) hypertension: Secondary | ICD-10-CM

## 2024-02-03 DIAGNOSIS — R7303 Prediabetes: Secondary | ICD-10-CM

## 2024-02-03 MED ORDER — EMPAGLIFLOZIN 10 MG PO TABS: 10.0000 mg | ORAL_TABLET | Freq: Every day | ORAL | 1 refills | Status: AC
